# Patient Record
Sex: Female | Born: 1965
Health system: Southern US, Community
[De-identification: ages and names within clinical notes are randomized; demographics above are authoritative.]

## PROBLEM LIST (undated history)

## (undated) DIAGNOSIS — F329 Major depressive disorder, single episode, unspecified: Secondary | ICD-10-CM

## (undated) DIAGNOSIS — G43909 Migraine, unspecified, not intractable, without status migrainosus: Secondary | ICD-10-CM

## (undated) DIAGNOSIS — I493 Ventricular premature depolarization: Secondary | ICD-10-CM

## (undated) DIAGNOSIS — R002 Palpitations: Secondary | ICD-10-CM

## (undated) DIAGNOSIS — F32A Depression, unspecified: Secondary | ICD-10-CM

## (undated) DIAGNOSIS — Z87442 Personal history of urinary calculi: Secondary | ICD-10-CM

## (undated) DIAGNOSIS — E039 Hypothyroidism, unspecified: Secondary | ICD-10-CM

## (undated) DIAGNOSIS — R112 Nausea with vomiting, unspecified: Secondary | ICD-10-CM

## (undated) DIAGNOSIS — T7840XA Allergy, unspecified, initial encounter: Secondary | ICD-10-CM

## (undated) DIAGNOSIS — Z8489 Family history of other specified conditions: Secondary | ICD-10-CM

## (undated) DIAGNOSIS — Z9889 Other specified postprocedural states: Secondary | ICD-10-CM

## (undated) DIAGNOSIS — F419 Anxiety disorder, unspecified: Secondary | ICD-10-CM

## (undated) HISTORY — DX: Migraine, unspecified, not intractable, without status migrainosus: G43.909

## (undated) HISTORY — PX: OTHER SURGICAL HISTORY: SHX169

## (undated) HISTORY — DX: Depression, unspecified: F32.A

## (undated) HISTORY — DX: Allergy, unspecified, initial encounter: T78.40XA

## (undated) HISTORY — DX: Hypothyroidism, unspecified: E03.9

## (undated) HISTORY — DX: Major depressive disorder, single episode, unspecified: F32.9

## (undated) HISTORY — PX: UPPER GI ENDOSCOPY: SHX6162

## (undated) HISTORY — PX: BUNIONECTOMY: SHX129

## (undated) HISTORY — DX: Anxiety disorder, unspecified: F41.9

---

## 1998-05-21 ENCOUNTER — Other Ambulatory Visit: Admission: RE | Admit: 1998-05-21 | Discharge: 1998-05-21 | Payer: Self-pay | Admitting: Obstetrics and Gynecology

## 1999-07-04 ENCOUNTER — Other Ambulatory Visit: Admission: RE | Admit: 1999-07-04 | Discharge: 1999-07-04 | Payer: Self-pay | Admitting: Obstetrics and Gynecology

## 2000-04-11 ENCOUNTER — Inpatient Hospital Stay (HOSPITAL_COMMUNITY): Admission: AD | Admit: 2000-04-11 | Discharge: 2000-04-13 | Payer: Self-pay | Admitting: Obstetrics and Gynecology

## 2000-05-23 ENCOUNTER — Other Ambulatory Visit: Admission: RE | Admit: 2000-05-23 | Discharge: 2000-05-23 | Payer: Self-pay | Admitting: Obstetrics and Gynecology

## 2001-06-10 ENCOUNTER — Other Ambulatory Visit: Admission: RE | Admit: 2001-06-10 | Discharge: 2001-06-10 | Payer: Self-pay | Admitting: Obstetrics and Gynecology

## 2002-07-23 ENCOUNTER — Other Ambulatory Visit: Admission: RE | Admit: 2002-07-23 | Discharge: 2002-07-23 | Payer: Self-pay | Admitting: Obstetrics and Gynecology

## 2003-08-19 ENCOUNTER — Other Ambulatory Visit: Admission: RE | Admit: 2003-08-19 | Discharge: 2003-08-19 | Payer: Self-pay | Admitting: Obstetrics and Gynecology

## 2004-09-30 ENCOUNTER — Other Ambulatory Visit: Admission: RE | Admit: 2004-09-30 | Discharge: 2004-09-30 | Payer: Self-pay | Admitting: Obstetrics and Gynecology

## 2007-01-09 ENCOUNTER — Inpatient Hospital Stay (HOSPITAL_COMMUNITY): Admission: AD | Admit: 2007-01-09 | Discharge: 2007-01-12 | Payer: Self-pay | Admitting: Obstetrics & Gynecology

## 2007-01-10 ENCOUNTER — Encounter (INDEPENDENT_AMBULATORY_CARE_PROVIDER_SITE_OTHER): Payer: Self-pay | Admitting: Obstetrics and Gynecology

## 2007-04-06 DIAGNOSIS — E039 Hypothyroidism, unspecified: Secondary | ICD-10-CM

## 2007-04-06 HISTORY — DX: Hypothyroidism, unspecified: E03.9

## 2007-08-01 ENCOUNTER — Ambulatory Visit (HOSPITAL_COMMUNITY): Admission: RE | Admit: 2007-08-01 | Discharge: 2007-08-01 | Payer: Self-pay | Admitting: Obstetrics and Gynecology

## 2007-10-18 ENCOUNTER — Encounter: Payer: Self-pay | Admitting: Family Medicine

## 2007-11-22 ENCOUNTER — Ambulatory Visit: Payer: Self-pay | Admitting: Family Medicine

## 2007-11-22 DIAGNOSIS — G43009 Migraine without aura, not intractable, without status migrainosus: Secondary | ICD-10-CM | POA: Insufficient documentation

## 2007-11-22 DIAGNOSIS — E039 Hypothyroidism, unspecified: Secondary | ICD-10-CM | POA: Insufficient documentation

## 2007-11-29 ENCOUNTER — Encounter: Payer: Self-pay | Admitting: Family Medicine

## 2007-12-05 ENCOUNTER — Encounter: Payer: Self-pay | Admitting: Family Medicine

## 2007-12-25 ENCOUNTER — Ambulatory Visit: Payer: Self-pay | Admitting: Family Medicine

## 2007-12-25 DIAGNOSIS — R42 Dizziness and giddiness: Secondary | ICD-10-CM | POA: Insufficient documentation

## 2008-02-14 ENCOUNTER — Encounter: Payer: Self-pay | Admitting: Family Medicine

## 2008-02-20 ENCOUNTER — Ambulatory Visit: Payer: Self-pay | Admitting: Family Medicine

## 2008-02-20 DIAGNOSIS — R55 Syncope and collapse: Secondary | ICD-10-CM | POA: Insufficient documentation

## 2008-02-20 LAB — CONVERTED CEMR LAB
Anti Nuclear Antibody(ANA): NEGATIVE
Sed Rate: 7 mm/hr (ref 0–22)
TSH: 0.516 microintl units/mL (ref 0.350–4.50)

## 2008-02-25 ENCOUNTER — Telehealth: Payer: Self-pay | Admitting: Family Medicine

## 2008-03-27 ENCOUNTER — Telehealth: Payer: Self-pay | Admitting: Family Medicine

## 2008-03-31 ENCOUNTER — Ambulatory Visit: Payer: Self-pay | Admitting: Family Medicine

## 2008-04-06 ENCOUNTER — Telehealth: Payer: Self-pay | Admitting: Family Medicine

## 2008-04-07 ENCOUNTER — Telehealth (INDEPENDENT_AMBULATORY_CARE_PROVIDER_SITE_OTHER): Payer: Self-pay | Admitting: *Deleted

## 2008-07-02 ENCOUNTER — Telehealth: Payer: Self-pay | Admitting: Family Medicine

## 2008-07-03 ENCOUNTER — Encounter: Payer: Self-pay | Admitting: Family Medicine

## 2008-07-07 LAB — CONVERTED CEMR LAB: TSH: 0.095 microintl units/mL — ABNORMAL LOW (ref 0.350–4.50)

## 2008-07-27 ENCOUNTER — Ambulatory Visit: Payer: Self-pay | Admitting: Family Medicine

## 2008-07-27 DIAGNOSIS — R5381 Other malaise: Secondary | ICD-10-CM | POA: Insufficient documentation

## 2008-07-27 DIAGNOSIS — N920 Excessive and frequent menstruation with regular cycle: Secondary | ICD-10-CM | POA: Insufficient documentation

## 2008-07-27 DIAGNOSIS — R5383 Other fatigue: Secondary | ICD-10-CM

## 2008-07-28 ENCOUNTER — Encounter: Payer: Self-pay | Admitting: Family Medicine

## 2008-07-29 LAB — CONVERTED CEMR LAB
HCT: 39.9 % (ref 36.0–46.0)
Hemoglobin: 13.1 g/dL (ref 12.0–15.0)
Iron: 124 ug/dL (ref 42–145)
MCHC: 32.8 g/dL (ref 30.0–36.0)
Platelets: 238 10*3/uL (ref 150–400)
RDW: 12.9 % (ref 11.5–15.5)
TIBC: 394 ug/dL (ref 250–470)

## 2008-08-06 ENCOUNTER — Telehealth: Payer: Self-pay | Admitting: Family Medicine

## 2008-08-14 ENCOUNTER — Telehealth: Payer: Self-pay | Admitting: Family Medicine

## 2008-10-02 ENCOUNTER — Ambulatory Visit: Payer: Self-pay | Admitting: Family Medicine

## 2008-10-02 ENCOUNTER — Emergency Department (HOSPITAL_BASED_OUTPATIENT_CLINIC_OR_DEPARTMENT_OTHER): Admission: EM | Admit: 2008-10-02 | Discharge: 2008-10-02 | Payer: Self-pay | Admitting: Emergency Medicine

## 2008-10-02 DIAGNOSIS — F410 Panic disorder [episodic paroxysmal anxiety] without agoraphobia: Secondary | ICD-10-CM | POA: Insufficient documentation

## 2008-10-06 ENCOUNTER — Telehealth: Payer: Self-pay | Admitting: Family Medicine

## 2008-10-07 ENCOUNTER — Telehealth (INDEPENDENT_AMBULATORY_CARE_PROVIDER_SITE_OTHER): Payer: Self-pay | Admitting: *Deleted

## 2008-10-07 ENCOUNTER — Encounter: Payer: Self-pay | Admitting: Family Medicine

## 2008-10-07 LAB — CONVERTED CEMR LAB
Free T4: 0.63 ng/dL — ABNORMAL LOW
T3, Free: 3.2 pg/mL
Thyroglobulin Ab: 356.2 — ABNORMAL HIGH

## 2008-10-13 ENCOUNTER — Encounter (HOSPITAL_COMMUNITY): Admission: RE | Admit: 2008-10-13 | Discharge: 2008-12-02 | Payer: Self-pay | Admitting: Family Medicine

## 2008-10-14 ENCOUNTER — Telehealth: Payer: Self-pay | Admitting: Family Medicine

## 2008-10-15 ENCOUNTER — Telehealth: Payer: Self-pay | Admitting: Family Medicine

## 2008-10-16 ENCOUNTER — Telehealth: Payer: Self-pay | Admitting: Family Medicine

## 2008-10-27 ENCOUNTER — Telehealth: Payer: Self-pay | Admitting: Family Medicine

## 2008-11-11 ENCOUNTER — Encounter: Payer: Self-pay | Admitting: Family Medicine

## 2008-11-11 ENCOUNTER — Telehealth: Payer: Self-pay | Admitting: Family Medicine

## 2008-11-12 LAB — CONVERTED CEMR LAB
Free T4: 0.83 ng/dL (ref 0.80–1.80)
T3, Free: 3.1 pg/mL (ref 2.3–4.2)

## 2008-11-16 ENCOUNTER — Ambulatory Visit: Payer: Self-pay | Admitting: Family Medicine

## 2009-02-17 ENCOUNTER — Encounter: Payer: Self-pay | Admitting: Family Medicine

## 2009-02-18 LAB — CONVERTED CEMR LAB
Free T4: 0.61 ng/dL — ABNORMAL LOW (ref 0.80–1.80)
T3, Free: 3.7 pg/mL (ref 2.3–4.2)
TSH: 3.34 microintl units/mL (ref 0.350–4.500)

## 2009-07-05 ENCOUNTER — Encounter (INDEPENDENT_AMBULATORY_CARE_PROVIDER_SITE_OTHER): Payer: Self-pay | Admitting: *Deleted

## 2009-07-07 ENCOUNTER — Encounter: Payer: Self-pay | Admitting: Family Medicine

## 2009-07-08 LAB — CONVERTED CEMR LAB: TSH: 0.014 microintl units/mL — ABNORMAL LOW (ref 0.350–4.500)

## 2009-09-08 ENCOUNTER — Telehealth: Payer: Self-pay | Admitting: Family Medicine

## 2009-09-16 ENCOUNTER — Encounter: Payer: Self-pay | Admitting: Family Medicine

## 2009-09-22 ENCOUNTER — Encounter: Payer: Self-pay | Admitting: Family Medicine

## 2009-09-22 ENCOUNTER — Telehealth (INDEPENDENT_AMBULATORY_CARE_PROVIDER_SITE_OTHER): Payer: Self-pay | Admitting: *Deleted

## 2009-09-23 ENCOUNTER — Ambulatory Visit: Payer: Self-pay | Admitting: Family Medicine

## 2009-10-05 ENCOUNTER — Telehealth (INDEPENDENT_AMBULATORY_CARE_PROVIDER_SITE_OTHER): Payer: Self-pay | Admitting: *Deleted

## 2009-10-05 ENCOUNTER — Encounter: Payer: Self-pay | Admitting: Family Medicine

## 2010-01-03 ENCOUNTER — Telehealth: Payer: Self-pay | Admitting: Family Medicine

## 2010-01-27 ENCOUNTER — Ambulatory Visit: Payer: Self-pay | Admitting: Family Medicine

## 2010-01-31 ENCOUNTER — Telehealth: Payer: Self-pay | Admitting: Family Medicine

## 2010-02-01 ENCOUNTER — Encounter: Payer: Self-pay | Admitting: Family Medicine

## 2010-02-01 LAB — CONVERTED CEMR LAB
Albumin: 4.2 g/dL (ref 3.5–5.2)
Alkaline Phosphatase: 70 units/L (ref 39–117)
BUN: 7 mg/dL (ref 6–23)
Creatinine, Ser: 0.6 mg/dL (ref 0.40–1.20)
Eosinophils Absolute: 0.1 10*3/uL (ref 0.0–0.7)
Eosinophils Relative: 1 % (ref 0–5)
Glucose, Bld: 82 mg/dL (ref 70–99)
HCT: 41.4 % (ref 36.0–46.0)
HDL: 42 mg/dL (ref >39)
Hemoglobin: 13.2 g/dL (ref 12.0–15.0)
LDL Cholesterol: 103 mg/dL — ABNORMAL HIGH (ref 0–99)
Lymphs Abs: 1.9 10*3/uL (ref 0.7–4.0)
MCV: 99.3 fL (ref 78.0–100.0)
Monocytes Absolute: 0.7 10*3/uL (ref 0.1–1.0)
Monocytes Relative: 7 % (ref 3–12)
Neutrophils Relative %: 74 % (ref 43–77)
Potassium: 4.2 meq/L (ref 3.5–5.3)
RBC: 4.17 M/uL (ref 3.87–5.11)
Triglycerides: 135 mg/dL (ref <150)
WBC: 10.2 10*3/uL (ref 4.0–10.5)

## 2010-02-17 ENCOUNTER — Telehealth: Payer: Self-pay | Admitting: Family Medicine

## 2010-06-27 ENCOUNTER — Encounter: Payer: Self-pay | Admitting: Family Medicine

## 2010-07-01 ENCOUNTER — Telehealth: Payer: Self-pay | Admitting: Family Medicine

## 2010-07-05 NOTE — Miscellaneous (Signed)
Summary: Naratriptan #9 tabs/ month covered.  Clinical Lists Changes  Medications: Rx of NARATRIPTAN HCL 2.5 MG TABS (NARATRIPTAN HCL) 1 tab by mouth x 1; repeat in 4 hrs if needed;  #9 x 2;  Signed;  Entered by: Seymour Bars DO;  Authorized by: Seymour Bars DO;  Method used: [id number] [address], [place], [place], [place]  [address], Ph: [phone] or [phone], Fax: [phone]    Prescriptions: NARATRIPTAN HCL 2.5 MG TABS (NARATRIPTAN HCL) 1 tab by mouth x 1; repeat in 4 hrs if needed  #9 x 2   Entered and Authorized by:   Seymour Bars DO   Signed by:   Seymour Bars DO on 10/05/2009   Method used:   [facility]        [facility]  [address] [phone][facility] (retail[facility]       [address] [place], [place]  [address]       Ph: [phone] or [phone]       Fax: [phone]   RxID:   [id number]

## 2010-07-05 NOTE — Medication Information (Signed)
Summary: Non Porcine Adjustment/[facility]  Non Porcine Adjustment/[facility]   Imported By: Lanelle Bal 02/09/2010 08:54:17  _____________________________________________________________________  External Attachment:    Type:   Image     Comment:   External Document

## 2010-07-05 NOTE — Assessment & Plan Note (Signed)
Summary: f/u thyroid/ mood   Vital Signs:  Patient profile:   45 year old female Height:      66.5 inches Weight:      141 pounds BMI:     22.50 O2 Sat:      96 % on Room air Pulse rate:   72 / minute BP sitting:   112 / 72  (left arm) Cuff size:   regular  Vitals Entered By: Payton Spark CMA (September 23, 2009 8:20 AM)  O2 Flow:  Room air CC: F/U thyroid. Refill xanax.    Primary Care Provider:  Seymour Bars DO  CC:  F/U thyroid. Refill xanax. Marland Kitchen  History of Present Illness: 45 yo WF presents for f/u hypothyroidism.  She has been feeling well on the compounded thyroid.  She just had her labs rechecked.  Her TSH was low but her free T4 was low normal.  She is not having palpitations very often.  She has some anxiety but it is situational.  Her energy level has been good.  Her new compounded thyroid dose was sent to [facility] yesterday.   She continues to have menstrual migraines, using Maxalt.        Current Medications (verified): 1)  Celexa 20 Mg  Tabs (Citalopram Hydrobromide) .... 1.5 Tabs By Mouth Daily 2)  Maxalt-Mlt 5 Mg  Tbdp (Rizatriptan Benzoate) .Marland Kitchen.. 1 Tab By Mouth X1, Repeat in 2 Hrs If Needed 3)  T4 105 Mcg & T3 20 Mcg Sr Capsules .... Take 1 Capsule By Mouth Before Breakfast 4)  Alprazolam 0.5 Mg Tabs (Alprazolam) .... 1/2 To 1 Tab By Mouth Two Times A Day As Needed Anxiety/ Panic  Allergies (verified): 1)  ! Baclofen 2)  ! Flexeril 3)  ! Lodine  Past History:  Past Medical History: hypothyroidism (started post-partum) 11-08 allergies depression/ anxiety migraines Hx of ASCUS with neg high risk HPV  gyn: Dr Earnest Bailey and pap 10-2009  Past Surgical History: Reviewed history from 12/05/2007 and no changes required. bunion surgery  [facility] (Dr Marcelle Overlie)   Social History: Reviewed history from 07/27/2008 and no changes required. 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 Never smoked. Denies  ETOH. Does not exercise. Fair diet  Review of Systems      See HPI  Physical Exam  General:  alert, well-developed, well-nourished, and well-hydrated.   Head:  normocephalic and atraumatic.   Ears:  EACs patent; TMs translucent and gray with good cone of light and bony landmarks.  Nose:  no nasal discharge.   Mouth:  good dentition and pharynx pink and moist.   Neck:  no masses.   Lungs:  Normal respiratory effort, chest expands symmetrically. Lungs are clear to auscultation, no crackles or wheezes. Heart:  Normal rate and regular rhythm. S1 and S2 normal without gallop, murmur, click, rub or other extra sounds. Extremities:  no LE edema Skin:  color normal.   Cervical Nodes:  No lymphadenopathy noted Psych:  good eye contact, not anxious appearing, and not depressed appearing.     Impression & Recommendations:  Problem # 1:  UNSPECIFIED HYPOTHYROIDISM (ICD-244.9) Doing well symptomatically and dose is being adjusted thru Dr Keitha Butte at [facility]. New RX was sent over yesterday.  Thyroid labs are UTD.    Problem # 2:  COMMON MIGRAINE (ICD-346.10) Changed her Maxalt to Naratriptan since it has a longer half life for menstrual migraine. Her updated medication list for this  problem includes:    Naratriptan Hcl 2.5 Mg Tabs (Naratriptan hcl) .Marland Kitchen... 1 tab by mouth x 1; repeat in 4 hrs if needed  Problem # 3:  PANIC ATTACK (ICD-300.01) Improved from last year.  I filled her RX for Alprazolam, using sparingly and will continue current dose of Celexa. Her updated medication list for this problem includes:    Celexa 20 Mg Tabs (Citalopram hydrobromide) .Marland Kitchen... 1.5 tabs by mouth daily    Alprazolam 0.5 Mg Tabs (Alprazolam) .Marland Kitchen... 1/2 to 1 tab by mouth two times a day as needed anxiety/ panic  Complete Medication List: 1)  Celexa 20 Mg Tabs (Citalopram hydrobromide) .... 1.5 tabs by mouth daily 2)  Naratriptan Hcl 2.5 Mg Tabs (Naratriptan hcl) .Marland Kitchen.. 1 tab by mouth x 1; repeat in 4 hrs if  needed 3)  T4 105 Mcg & T3 20 Mcg Sr Capsules  .... Take 1 capsule by mouth before breakfast 4)  Alprazolam 0.5 Mg Tabs (Alprazolam) .... 1/2 to 1 tab by mouth two times a day as needed anxiety/ panic  Patient Instructions: 1)  Return for f/u in 4 months and will update fasting labs including thyroid panel. Prescriptions: ALPRAZOLAM 0.5 MG TABS (ALPRAZOLAM) 1/2 to 1 tab by mouth two times a day as needed anxiety/ panic  #24 x 0   Entered and Authorized by:   Seymour Bars DO   Signed by:   Seymour Bars DO on 09/23/2009   Method used:   Printed then [facility]ed [facility] ...       [phone][facility] ([facility]ail[facility]       [address] [place]  [address]       Ph: [phone] or [phone]       Fax: [phone]   RxID:   [id number] NARATRIPTAN HCL 2.5 MG TABS (NARATRIPTAN HCL) 1 tab by mouth x 1; repeat in 4 hrs if needed  #12 tabs x 2   Entered and Authorized by:   Seymour Bars DO   Signed by:   Seymour Bars DO on 09/23/2009   Method used:   [facility]        [facility]  [address] [phone][facility] ([facility]ail)       [address] [place], [place]  [address]       Ph: [phone] or [phone]       Fax: [phone]   RxID:   [id number]

## 2010-07-05 NOTE — Progress Notes (Signed)
Summary: form for prior auth on Naratriptan faxed to [facility]  Phone Note Outgoing Call   Call placed by: Marj Call placed to: [employer] Summary of Call: form received from [facility] for Naratriptan 2.5 HCL faxed to [facility] Initial call taken by: Roselle Locus,  Oct 05, 2009 2:05 PM  Follow-up for Phone Call        Forms received and placed in Dr. Senaida Lange office Follow-up by: Payton Spark CMA,  Oct 05, 2009 2:23 PM

## 2010-07-05 NOTE — Progress Notes (Signed)
Summary: Not better  Phone Note Call from Patient Call back at [phone]   Caller: Patient Call For: [facility] Summary of Call: Pt see by Dr. Cathey Endow last week and was told to call if not feeling any better. Pt is not better at all states she had a rough weekend with symptoms Initial call taken by: Kathlene November,  January 31, 2010 8:41 AM  Follow-up for Phone Call        Will tx for sinusitis. Follow-up by: Nani Gasser MD,  January 31, 2010 9:29 AM  Additional Follow-up for Phone Call Additional follow up Details #1::        Pt notified med sent to pharmacy Additional Follow-up by: Kathlene November,  January 31, 2010 9:48 AM    New/Updated Medications: AMOXICILLIN 875 MG TABS (AMOXICILLIN) Take 1 tablet by mouth two times a day for 10 days Prescriptions: AMOXICILLIN 875 MG TABS (AMOXICILLIN) Take 1 tablet by mouth two times a day for 10 days  #20 x 0   Entered and Authorized by:   Nani Gasser MD   Signed by:   Nani Gasser MD on 01/31/2010   Method used:   [facility]        [facility]  [address] [phone][facility] ([facility]ail[facility]       [address] [place], [place]  [address]       Ph: [phone] or [phone]       Fax: [phone]   RxID:   [id number]

## 2010-07-05 NOTE — Progress Notes (Signed)
Summary: TSH labs  Phone Note Call from Patient   Caller: Patient Summary of Call: Pt is going for TSH labs this week. Please order and I will fax.  Initial call taken by: Payton Spark CMA,  September 08, 2009 2:13 PM  Follow-up for Phone Call        ordered. Follow-up by: Seymour Bars DO,  September 08, 2009 2:17 PM     Appended Document: TSH labs faxed

## 2010-07-05 NOTE — Assessment & Plan Note (Signed)
Summary: URI/ labs   Vital Signs:  Patient profile:   45 year old female Height:      66.5 inches Weight:      144 pounds BMI:     22.98 O2 Sat:      96 % on Room air Pulse rate:   83 / minute BP sitting:   111 / 67  (left arm) Cuff size:   regular  Vitals Entered By: Payton Spark CMA (January 27, 2010 8:36 AM)  O2 Flow:  Room air CC: F/U. Also c/o congestion, ST and productive cough x 3 days.   Primary Care Provider:  Seymour Bars DO  CC:  F/U. Also c/o congestion and ST and productive cough x 3 days.Marland Kitchen  History of Present Illness: 45 yo WF presents for a cold that began Sunday night.  She woke up with a sore throat.  Started having body aches on Monday.  she has been very tired.  She has head and chest congestion.  She has not taken anything OTC for her symptoms.  Denies sick contacts.  Denies sinus pressure or pain.  Denies sputum production, CP or DOE.  Current Medications (verified): 1)  Celexa 20 Mg  Tabs (Citalopram Hydrobromide) .... 1.5 Tabs By Mouth Daily 2)  Naratriptan Hcl 2.5 Mg Tabs (Naratriptan Hcl) .Marland Kitchen.. 1 Tab By Mouth X 1; Repeat in 4 Hrs If Needed 3)  T4 105 Mcg & T3 20 Mcg Sr Capsules .... Take 1 Capsule By Mouth Before Breakfast 4)  Alprazolam 0.5 Mg Tabs (Alprazolam) .... 1/2 To 1 Tab By Mouth Two Times A Day As Needed Anxiety/ Panic  Allergies (verified): 1)  ! Baclofen 2)  ! Flexeril 3)  ! Lodine  Past History:  Past Medical History: Reviewed history from 09/23/2009 and no changes required. hypothyroidism (started post-partum) 11-08 allergies depression/ anxiety migraines Hx of ASCUS with neg high risk HPV  gyn: Dr Earnest Bailey and pap 10-2009  Social History: Reviewed history from 07/27/2008 and no changes required. 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 Never smoked. Denies ETOH. Does not exercise. Fair diet  Review of Systems      See HPI  Physical Exam  General:  alert, well-developed,  well-nourished, and well-hydrated.   Head:  normocephalic and atraumatic.  sinuses NTTP Eyes:  conjunctiva minimally injected with watering bilat, no discharge or lid edema Ears:  EACs patent; TMs translucent and gray with good cone of light and bony landmarks.  Nose:  nasal congestion with clear rhinorrhea present Mouth:  o/p mildly injected.  no exudates or vesicles Neck:  shotty, tender submandibular LNs Lungs:  Normal respiratory effort, chest expands symmetrically. Lungs are clear to auscultation, no crackles or wheezes. Heart:  Normal rate and regular rhythm. S1 and S2 normal without gallop, murmur, click, rub or other extra sounds. Skin:  color normal.     Impression & Recommendations:  Problem # 1:  VIRAL URI (ICD-465.9) Day 5 of viral URI w/o any sign of bacterial infection. Work on supportive care measures and call if not improved in 7 days.  Problem # 2:  UNSPECIFIED HYPOTHYROIDISM (ICD-244.9) Repeat labs today with fasting labs.  Adjust as indicated. Orders: T-TSH [phone]) T-T3, Free [phone]) T-T4, Free [phone])  Labs Reviewed: TSH: 0.120 (09/16/2009)     Complete Medication List: 1)  Celexa 20 Mg Tabs (Citalopram hydrobromide) .... 1.5 tabs by mouth daily 2)  Naratriptan Hcl 2.5 Mg Tabs (Naratriptan hcl) .Marland KitchenMarland KitchenMarland Kitchen  1 tab by mouth x 1; repeat in 4 hrs if needed 3)  T4 105 Mcg & T3 20 Mcg Sr Capsules  .... Take 1 capsule by mouth before breakfast 4)  Alprazolam 0.5 Mg Tabs (Alprazolam) .... 1/2 to 1 tab by mouth two times a day as needed anxiety/ panic  Other Orders: T-CBC w/Diff ([id number]) T-Comprehensive Metabolic Panel [phone]) T-Lipid Profile ([id number])  Patient Instructions: 1)  Supportive care for viral upper respiratory tract infection. 2)  REst, clear fluids, nasal saline, Advil Cold and Sinus. 3)  Call if not improved in 7 days. 4)  Labs today. 5)  Will call you w/ results tomorrow.

## 2010-07-05 NOTE — Progress Notes (Signed)
Summary: Thyroid Rx update        New/Updated Medications: * T4 105 MCG & T3 20 MCG SR CAPSULES Take 1 capsule by mouth before breakfast

## 2010-07-05 NOTE — Progress Notes (Signed)
Summary: Thyroid RF  Phone Note Refill Request   Refills Requested: Medication #1:  T4 105 MCG & T3 20 MCG SR CAPSULES Take 1 capsule by mouth before breakfast Pt requests 90 day supply.  Initial call taken by: Payton Spark CMA,  January 03, 2010 12:08 PM    Prescriptions: T4 105 MCG & T3 20 MCG SR CAPSULES Take 1 capsule by mouth before breakfast  #90 x 1   Entered and Authorized by:   Seymour Bars DO   Signed by:   Seymour Bars DO on 01/03/2010   Method used:   Printed then [facility]ed [facility] ...       [facility]  [address] [phone][facility] ([facility]ail)       [address] [place]  [address]       Ph: [phone] or [phone]       Fax: [phone]   RxID:   [id number]  th  Appended Document: Thyroid RF Pt aware of the above

## 2010-07-05 NOTE — Miscellaneous (Signed)
Summary: Orders Update  Clinical Lists Changes  Orders: Added new Test order of T-T3, Free [phone]) - Signed Added new Test order of T-T4, Free [phone]) - Signed Added new Test order of T-TSH [phone]) - Signed

## 2010-07-05 NOTE — Medication Information (Signed)
Summary: T4 & T3/[facility]  T4 & T3/[facility]   Imported By: Lanelle Bal 09/28/2009 14:27:08  _____________________________________________________________________  External Attachment:    Type:   Image     Comment:   External Document

## 2010-07-05 NOTE — Progress Notes (Signed)
Summary: URI Sxs have returned   Phone Note Call from Patient   Caller: Patient Summary of Call: Pt states she recently completed Amox given to her over the phone by Dr. Linford Arnold. Pt felt better for a few days but now all prior Sxs have returned and she is feeling bad again. Pt leaves for [place] in 2 weeks and wants to be sure she is well before leaving.  Initial call taken by: Payton Spark CMA,  February 17, 2010 2:20 PM  Follow-up for Phone Call        I reviewed her labs etc and she was treated with a good round of antibiotics.  I do not think her symptoms are of a recurrent bacterial infection since she just came off antibitotics.  She may be having more allergies.   If she is having sinus pain/ pressure/ fevers, set up OV.   Follow-up by: Seymour Bars DO,  February 18, 2010 8:14 AM     Appended Document: URI Sxs have returned  [facility] informing Pt of the above

## 2010-07-07 NOTE — Progress Notes (Signed)
Summary: thyroid labs  Phone Note Call from Patient Call back at [phone]   Caller: Patient Call For: Seymour Bars DO Summary of Call: pt would llike to know if it is time for repeat thyroid labs.  She is about due for refill of meds. Initial call taken by: Francee Piccolo CMA Duncan Dull),  July 01, 2010 10:37 AM  Follow-up for Phone Call        lab order printed.  not due till mid FEB. Follow-up by: Seymour Bars DO,  July 01, 2010 10:47 AM     Appended Document: thyroid labs Pt aware

## 2010-07-16 ENCOUNTER — Encounter: Payer: Self-pay | Admitting: Family Medicine

## 2010-07-18 LAB — CONVERTED CEMR LAB
Free T4: 0.98 ng/dL (ref 0.80–1.80)
TSH: 0.798 microintl units/mL (ref 0.350–4.500)

## 2010-08-03 ENCOUNTER — Encounter: Payer: Self-pay | Admitting: Emergency Medicine

## 2010-08-11 NOTE — Letter (Signed)
Summary: External Correspondence  External Correspondence   Imported By: Dannette Barbara 08/03/2010 14:35:47  _____________________________________________________________________  External Attachment:    Type:   Image     Comment:   External Document

## 2010-08-23 ENCOUNTER — Encounter: Payer: Self-pay | Admitting: Family Medicine

## 2010-08-30 ENCOUNTER — Ambulatory Visit: Payer: Self-pay | Admitting: Family Medicine

## 2010-09-05 ENCOUNTER — Encounter: Payer: Self-pay | Admitting: Family Medicine

## 2010-09-08 ENCOUNTER — Encounter: Payer: Self-pay | Admitting: Family Medicine

## 2010-09-08 ENCOUNTER — Ambulatory Visit (INDEPENDENT_AMBULATORY_CARE_PROVIDER_SITE_OTHER): Payer: BC Managed Care – PPO | Admitting: Family Medicine

## 2010-09-08 DIAGNOSIS — E039 Hypothyroidism, unspecified: Secondary | ICD-10-CM

## 2010-09-08 DIAGNOSIS — F3289 Other specified depressive episodes: Secondary | ICD-10-CM

## 2010-09-08 DIAGNOSIS — G43009 Migraine without aura, not intractable, without status migrainosus: Secondary | ICD-10-CM

## 2010-09-08 DIAGNOSIS — F329 Major depressive disorder, single episode, unspecified: Secondary | ICD-10-CM

## 2010-09-08 MED ORDER — ALPRAZOLAM 0.5 MG PO TABS: 0.5000 mg | ORAL_TABLET | Freq: Two times a day (BID) | ORAL | Status: DC | PRN

## 2010-09-08 MED ORDER — RIZATRIPTAN BENZOATE 10 MG PO TABS: 10.0000 mg | ORAL_TABLET | Freq: Once | ORAL | Status: DC | PRN

## 2010-09-08 MED ORDER — TOPIRAMATE 25 MG PO TABS: ORAL_TABLET | ORAL | Status: DC

## 2010-09-08 MED ORDER — CITALOPRAM HYDROBROMIDE 40 MG PO TABS: 40.0000 mg | ORAL_TABLET | Freq: Every day | ORAL | Status: DC

## 2010-09-08 NOTE — Progress Notes (Signed)
  Subjective:    Patient ID: Sydney Bailey, female    DOB: [birth year], 45 y.o.   MRN: [id number]  HPI  45 yo WF presents for problems with fatigue and stress.  She has been stressed out since Christmas.  She is having to work more since her co-worker has been sick.  This is starting to get better.  She takes Celexa 20 mg / day which helps but she is interested in upping her dose.  She is having 3-4 HAs/ wk and the naratriptan is not helping as much.  She is seeing a chiropractor which has helped her back and neck pain.  Her TSH, free T3 and T4 were perfect in Feb, on compounded thyroid.  Denies suicidal thoughts but is both anxious and tired all the time.  She is not sleeping as well.    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    BP 110/72  Pulse 62  Ht 5' 6.5" (1.689 m)  Wt 147 lb (66.679 kg)  BMI 23.37 kg/m2  SpO2 99%  LMP 09/01/2010    Review of Systems  Constitutional: Positive for fatigue. Negative for fever and unexpected weight change.  Respiratory: Negative for shortness of breath.   Cardiovascular: Negative for palpitations.  Gastrointestinal: Negative for nausea, diarrhea and constipation.  Skin: Negative for pallor.  Neurological: Positive for headaches. Negative for weakness.  Hematological: Does not bruise/bleed easily.  Psychiatric/Behavioral: Positive for sleep disturbance, dysphoric mood and decreased concentration. Negative for suicidal ideas and self-injury. The patient is nervous/anxious. The patient is not hyperactive.        Objective:   Physical Exam  Constitutional: She appears well-developed and well-nourished. No distress.  HENT:  Head: Normocephalic and atraumatic.  Eyes: Conjunctivae are normal. No scleral icterus.  Neck: Neck supple. No thyromegaly present.  Cardiovascular: Normal rate, regular rhythm and normal heart sounds.   Pulmonary/Chest: Effort normal and breath sounds normal.    Musculoskeletal: She exhibits no edema.  Skin: Skin is warm and dry. No pallor.  Psychiatric:       Flat affect          Assessment & Plan:

## 2010-09-08 NOTE — Patient Instructions (Addendum)
Increase Citalopram to 40 mg once daily.  Add Topamax at bedtime for migraine prevention -- start with 25 mg at night for 1 wk , then go up to 50 mg at night until your follow up with me.  Work on improving sleep, exercise, stress reduction.  Use Maxalt (instead of naratriptan) for migraine rescue up to 2 x a wk and use Aleve for less severe headaches up to 3 x a wk.  Return in 1 month for f/u/ [facility] #2

## 2010-09-09 NOTE — Assessment & Plan Note (Signed)
More frequent migraines linked to added stressors, poor sleep, lack of exercise.  Naratriptan not working as well for rescue.  Will go ahead and start her on prophylactic treatment with Topamax at bedtime, 25 mg for the first week, then up to 50 mg until f/u visit.  May need to titrate up to 100 mg bid.  We discussed stress reduction, sleep, diet and exercise as part of the treament.

## 2010-09-09 NOTE — Assessment & Plan Note (Signed)
Reviewed her TSH, Free t3 and free t4 levels from [place] and they were perfect!  She is to stay on the current dose of thyroid medication.  WE discussed that her fatigue was NOT due to her thyroid.

## 2010-09-09 NOTE — Assessment & Plan Note (Signed)
Worsened by stressors at home and work over the past 3 mos.  She agrees to go up on her dose of Celexa from 20 to 40 mg/ day.  Will call if any problems and will work on stress reduction.  F/u in 6 wks.  She anticipates her work schedule to get better.

## 2010-09-14 LAB — DIFFERENTIAL
Lymphocytes Relative: 33 % (ref 12–46)
Lymphs Abs: 2.2 10*3/uL (ref 0.7–4.0)
Monocytes Absolute: 0.6 10*3/uL (ref 0.1–1.0)
Monocytes Relative: 9 % (ref 3–12)
Neutro Abs: 3.7 10*3/uL (ref 1.7–7.7)

## 2010-09-14 LAB — CBC
Hemoglobin: 13.5 g/dL (ref 12.0–15.0)
RBC: 4.26 MIL/uL (ref 3.87–5.11)

## 2010-09-14 LAB — BASIC METABOLIC PANEL
CO2: 21 mEq/L (ref 19–32)
Calcium: 9.2 mg/dL (ref 8.4–10.5)
GFR calc Af Amer: >60 mL/min (ref >60)
GFR calc non Af Amer: >60 mL/min (ref >60)
Sodium: 137 mEq/L (ref 135–145)

## 2010-09-25 ENCOUNTER — Other Ambulatory Visit: Payer: Self-pay | Admitting: Family Medicine

## 2010-10-04 ENCOUNTER — Other Ambulatory Visit: Payer: Self-pay | Admitting: *Deleted

## 2010-10-04 MED ORDER — CITALOPRAM HYDROBROMIDE 40 MG PO TABS: 40.0000 mg | ORAL_TABLET | Freq: Every day | ORAL | Status: DC

## 2010-10-11 ENCOUNTER — Encounter: Payer: Self-pay | Admitting: Family Medicine

## 2010-10-11 ENCOUNTER — Other Ambulatory Visit: Payer: Self-pay | Admitting: Family Medicine

## 2010-10-11 ENCOUNTER — Emergency Department (HOSPITAL_BASED_OUTPATIENT_CLINIC_OR_DEPARTMENT_OTHER)
Admission: EM | Admit: 2010-10-11 | Discharge: 2010-10-11 | Disposition: A | Discharge status: Home / Self Care | Payer: BC Managed Care – PPO | Attending: Emergency Medicine | Admitting: Emergency Medicine

## 2010-10-11 ENCOUNTER — Ambulatory Visit (INDEPENDENT_AMBULATORY_CARE_PROVIDER_SITE_OTHER): Payer: BC Managed Care – PPO | Admitting: Family Medicine

## 2010-10-11 VITALS — BP 114/70 | HR 75 | Ht 66.5 in | Wt 144.0 lb

## 2010-10-11 DIAGNOSIS — F329 Major depressive disorder, single episode, unspecified: Secondary | ICD-10-CM

## 2010-10-11 DIAGNOSIS — F410 Panic disorder [episodic paroxysmal anxiety] without agoraphobia: Secondary | ICD-10-CM

## 2010-10-11 DIAGNOSIS — F3289 Other specified depressive episodes: Secondary | ICD-10-CM

## 2010-10-11 DIAGNOSIS — F411 Generalized anxiety disorder: Secondary | ICD-10-CM | POA: Insufficient documentation

## 2010-10-11 DIAGNOSIS — G43009 Migraine without aura, not intractable, without status migrainosus: Secondary | ICD-10-CM

## 2010-10-11 DIAGNOSIS — Z23 Encounter for immunization: Secondary | ICD-10-CM

## 2010-10-11 DIAGNOSIS — E039 Hypothyroidism, unspecified: Secondary | ICD-10-CM | POA: Insufficient documentation

## 2010-10-11 DIAGNOSIS — G43909 Migraine, unspecified, not intractable, without status migrainosus: Secondary | ICD-10-CM

## 2010-10-11 DIAGNOSIS — Z79899 Other long term (current) drug therapy: Secondary | ICD-10-CM | POA: Insufficient documentation

## 2010-10-11 MED ORDER — ZOLPIDEM TARTRATE ER 6.25 MG PO TBCR: 6.2500 mg | EXTENDED_RELEASE_TABLET | Freq: Every evening | ORAL | Status: AC | PRN

## 2010-10-11 MED ORDER — PROMETHAZINE HCL 25 MG RE SUPP: 25.0000 mg | Freq: Four times a day (QID) | RECTAL | Status: AC | PRN

## 2010-10-11 MED ORDER — ALPRAZOLAM 0.5 MG PO TABS: ORAL_TABLET | ORAL | Status: DC

## 2010-10-11 NOTE — Assessment & Plan Note (Signed)
tsh perfect in Feb.  Will recheck her in 2 mos.

## 2010-10-11 NOTE — Assessment & Plan Note (Signed)
Continue Celexa 40 mg/ day.  Hopefully improving her physical feelings off topamax helps her mood.

## 2010-10-11 NOTE — Patient Instructions (Addendum)
Wean off Topamax.  Take 1 tab at bedtime for 3 days then stop.  [facility]erral [facility]. OK to use Maxalt for rescue.  Xanax RX changed, RFd. Stay on Celexa 40 mg/ day. Call if you want to add conseling.  RX for Phenergan suppositories given. RX for Ambien CR (low dose) to take at bedtime given.  Return for f/u sleep/ anxiety/ HAs/ thyroid in 6 wks.

## 2010-10-11 NOTE — Progress Notes (Signed)
Subjective:    Patient ID: Sydney Bailey, female    DOB: Jun 29, 1965, 45 y.o.   MRN: [id number]  HPI  45 yo WF presents for a panic attack that woke her up from sleep at 1:30 In the AM last night.  She took 1/2 of a Xanax.  She had racing head, shortness of breath.  She was trying to relax but she was not feeling any better.  She heard her daughter cry and it set off her anxiety even more.  She felt a little better after taking the other 1/2 of her xanax.  She felt like her body was 'flushed'.  She was given Ativan 2 x  To relax her.  She had not had a bad one in a year.  She is having less HAs on Topamax but is having trouble with paresthesias. And word finding problems.  It is making her less hungry.still taking maxalt for migraine rescue.  Her thyroid was at goal on current meds in Feb.  We increased her Celexa in in April..    BP 114/70  Pulse 75  Ht 5' 6.5" (1.689 m)  Wt 144 lb (65.318 kg)  BMI 22.89 kg/m2  SpO2 97%  Past Medical History  Diagnosis Date  . Hypothyroidism 11-08    started post- partum  . Allergy   . Anxiety   . Depression   . Migraines     Past Surgical History  Procedure Date  . Bunionectomy   . Essure btl     No family history on file.  History   Social History  . Marital Status: Married    Spouse Name: N/A    Number of Children: N/A  . Years of Education: N/A   Occupational History  . Not on file.   Social History Main Topics  . Smoking status: Never Smoker   . Smokeless tobacco: Not on file  . Alcohol Use: No  . Drug Use:   . Sexually Active:    Other Topics Concern  . Not on file   Social History Narrative  . No narrative on file    Allergies  Allergen Reactions  . Baclofen     REACTION: hives  . Cyclobenzaprine Hcl     REACTION: hives  . Etodolac     Current outpatient prescriptions:ALPRAZolam (XANAX) 0.5 MG tablet, 1/2 to 2 tabs po q 4-6 hrs prn panic attacks, Disp: 24 tablet, Rfl: 0;  citalopram (CELEXA) 40 MG tablet,  Take 1 tablet (40 mg total) by mouth daily., Disp: 90 tablet, Rfl: 1;  NON FORMULARY, T4 105 mcg & T3 20 mcg SR Capsules- Take 1 capsule po before breakfast , Disp: , Rfl:  promethazine (PHENERGAN) 25 MG suppository, Place 1 suppository (25 mg total) rectally every 6 (six) hours as needed for nausea., Disp: 12 each, Rfl: 2;  rizatriptan (MAXALT) 10 MG tablet, Take 1 tablet (10 mg total) by mouth once as needed for migraine. May repeat in 2 hours if needed, Disp: 12 tablet, Rfl: 3;  topiramate (TOPAMAX) 25 MG tablet, 1 tab po qhs x 1 wk then increase to 2 tabs po qhs, Disp: 60 tablet, Rfl: 1 zolpidem (AMBIEN CR) 6.25 MG CR tablet, Take 1 tablet (6.25 mg total) by mouth at bedtime as needed for sleep., Disp: 30 tablet, Rfl: 0;  DISCONTD: ALPRAZolam (XANAX) 0.5 MG tablet, Take 1 tablet (0.5 mg total) by mouth 2 (two) times daily as needed. 1/2 to 1 tab po bid prn for anxiety/ panic, Disp:  30 tablet, Rfl: 0;  DISCONTD: naratriptan (AMERGE) 2.5 MG tablet, , Disp: , Rfl:    Review of Systems  Constitutional: Positive for appetite change (decrease) and fatigue. Negative for fever and unexpected weight change.  Eyes: Negative for visual disturbance.  Respiratory: Negative for shortness of breath.   Cardiovascular: Positive for palpitations. Negative for chest pain and leg swelling.  Gastrointestinal: Positive for nausea (only w/ dizziness and HA). Negative for vomiting and abdominal pain.  Neurological: Positive for dizziness, light-headedness and numbness. Negative for tremors and weakness.  Psychiatric/Behavioral: Positive for sleep disturbance, dysphoric mood and decreased concentration. Negative for suicidal ideas and self-injury. The patient is nervous/anxious.        Objective:   Physical Exam  Constitutional: She is oriented to person, place, and time. She appears well-developed and well-nourished.       Appears tired  Eyes: Conjunctivae are normal. No scleral icterus.  Neck: Neck supple. No  thyromegaly present.  Cardiovascular: Normal rate, regular rhythm and normal heart sounds.   No murmur heard. Pulmonary/Chest: Effort normal and breath sounds normal. No respiratory distress. She has no wheezes.  Musculoskeletal: She exhibits no edema.  Lymphadenopathy:    She has no cervical adenopathy.  Neurological: She is alert and oriented to person, place, and time. Gait normal.       No tremor  Skin: Skin is warm and dry.  Psychiatric: Her speech is normal. Judgment and thought content normal. She is withdrawn. Cognition and memory are normal. She exhibits a depressed mood.          Assessment & Plan:

## 2010-10-11 NOTE — Assessment & Plan Note (Signed)
Obvious adverse SEs from topamax.  Will wean off and stop.  Since she has had so many problems with medications, will refer [facility] to [facility] for eval and mgmt.

## 2010-10-11 NOTE — Telephone Encounter (Signed)
Pt didnt get Rx at pharm for the ambien 6.25mg  po QHS /PRN.  They did get the phenergan.  Please advise.  [facility] do [facility] the [place] [facility], but they just have not filled yet.  Pharmacist will call the pt and inform them. Plan:  Routed to Dr. Arlice Colt, LPN [facility]

## 2010-10-11 NOTE — Assessment & Plan Note (Signed)
Severe panic attack last night without known stressors.  Went to the ED and had 2 doses of Ativan so is a bit slowed today.  Has not been having them more than 1 x a yr on Celexa, so will continue.  I wrote out on her alprazolam which she rarely uses that she can take 0.25 up to 1 mg every 4-6 hrs prn.  Consider adding counseling but she declined today.

## 2010-10-15 ENCOUNTER — Emergency Department (HOSPITAL_BASED_OUTPATIENT_CLINIC_OR_DEPARTMENT_OTHER)
Admission: EM | Admit: 2010-10-15 | Discharge: 2010-10-15 | Disposition: A | Discharge status: Home / Self Care | Payer: BC Managed Care – PPO | Attending: Emergency Medicine | Admitting: Emergency Medicine

## 2010-10-15 DIAGNOSIS — F411 Generalized anxiety disorder: Secondary | ICD-10-CM | POA: Insufficient documentation

## 2010-10-15 DIAGNOSIS — Z79899 Other long term (current) drug therapy: Secondary | ICD-10-CM | POA: Insufficient documentation

## 2010-10-15 DIAGNOSIS — E039 Hypothyroidism, unspecified: Secondary | ICD-10-CM | POA: Insufficient documentation

## 2010-10-17 ENCOUNTER — Telehealth: Payer: Self-pay | Admitting: *Deleted

## 2010-10-17 NOTE — Telephone Encounter (Signed)
Pt called requesting referral to Dr. Anastasio Auerbach at [facility] for anxiety and panic attacks. I faxed over notes to his office. Office will contact Pt for apt. Pt is aware.

## 2010-10-18 NOTE — H&P (Signed)
NAMETODD, ARGABRIGHT             ACCOUNT NO.:  [id number]   MEDICAL RECORD NO.:  [id number]          PATIENT TYPE:  INP   LOCATION:                                FACILITY:  [facility]   PHYSICIAN:  Duke Salvia. Marcelle Overlie, M.D.DATE OF BIRTH:  03-16-1966   DATE OF ADMISSION:  DATE OF DISCHARGE:                              HISTORY & PHYSICAL   CHIEF COMPLAINT:  Preeclampsia at term.   HISTORY OF PRESENT ILLNESS:  A 45 year old G63, P2 with EDD of February 04, 2007; EGA is 10 and three-sevenths.  Presents to the office today for  a routine visit complaining of increased lower extremity edema, was  noted to have 3+ proteinuria, a BP 140/90.  Presents now for two-stage  labor induction for mild preeclampsia at term, labs pending.   Her 3-hour GTT was normal after a slightly elevated 1-hour GTT.   She had previously declined amniocentesis with a first trimester screen  showing a 1:119 trisomy risk.  Blood type is O positive.  In the office  today, NST was reactive with some irregular contractions.   PAST MEDICAL HISTORY:  Allergies:  1. LODINE.  2. FLEXERIL.  3. BACLOFEN.   CURRENT MEDICATIONS:  Prenatal vitamins.   OBSTETRICAL HISTORY:  A vaginal delivery in 1997 with mild PIH.  A  vaginal delivery in 2001, a 7-pound 6-ounce female.   For family history and social history, please see the [facility] form for  details.   PHYSICAL EXAMINATION:  Temperature 98.2, blood pressure 140/90.  HEENT:  Unremarkable.  NECK:  Supple without masses.  LUNGS:  Clear.  CARDIOVASCULAR:  Regular rate and rhythm without murmurs, rubs or  gallops noted.  BREASTS:  Not examined.  Fundal height 37 cm.  Fetal heart rate 140.  Cervix was closed, soft, vertex presenting.  EXTREMITIES:  Revealed reflexes 1-2+, 1-2+ edema, no clonus.   IMPRESSION:  1. Thirty-six and three-sevenths week intrauterine pregnancy.  2. Mild pregnancy-induced hypertension with significant proteinuria.   PLAN:  Will admit for  two-stage labor induction, labs pending.      Richard M. Marcelle Overlie, M.D.  Electronically Signed     [facility]MED[facility]  D:  01/09/2007  T:  01/09/2007  Job:  [id number]

## 2010-11-22 ENCOUNTER — Ambulatory Visit: Payer: BC Managed Care – PPO | Admitting: Family Medicine

## 2010-12-13 ENCOUNTER — Telehealth: Payer: Self-pay | Admitting: Family Medicine

## 2010-12-13 DIAGNOSIS — R232 Flushing: Secondary | ICD-10-CM

## 2010-12-13 DIAGNOSIS — E039 Hypothyroidism, unspecified: Secondary | ICD-10-CM

## 2010-12-13 NOTE — Telephone Encounter (Signed)
Pt is on thyroid and progesterone medications and she is stating it is time she get levels done.  Pt needs an order for her thyroid and progesterone levels.  Please send order down to [facility] and call the pt to let her know it has been done at [phone]. Plan:  Routed to Dr. Arlice Colt, LPN [facility]

## 2010-12-13 NOTE — Telephone Encounter (Signed)
Pt informed that lab orders printed and sent to [facility], and she can have them drawn, Jarvis Newcomer, LPN [facility] Dimes

## 2010-12-13 NOTE — Telephone Encounter (Signed)
Lab order printed.  Can be done at the lab downstairs.

## 2010-12-16 ENCOUNTER — Telehealth: Payer: Self-pay | Admitting: Family Medicine

## 2010-12-16 LAB — T3, FREE: T3, Free: 2.7 pg/mL (ref 2.3–4.2)

## 2010-12-16 LAB — T4, FREE: Free T4: 0.98 ng/dL (ref 0.80–1.80)

## 2010-12-16 NOTE — Telephone Encounter (Signed)
Pt aware of the above  

## 2010-12-16 NOTE — Telephone Encounter (Signed)
Pls let pt know that her progesterone level and thyroid came back normal.  Continue current doses of medication.

## 2011-02-27 ENCOUNTER — Other Ambulatory Visit: Payer: Self-pay | Admitting: Family Medicine

## 2011-02-27 MED ORDER — ALPRAZOLAM 0.5 MG PO TABS: ORAL_TABLET | ORAL | Status: DC

## 2011-02-27 NOTE — Telephone Encounter (Signed)
Request for refill of Alprazolam 0.5 mg.  Plan: Refilled # 24/0 refills.  Appt UTD.  Jarvis Newcomer, LPN [facility] Dimes

## 2011-03-20 LAB — CBC
Hemoglobin: 7.2 — CL
MCHC: 33.6
MCHC: 34.1
MCHC: 34.3
MCV: 87.2
MCV: 88.5
Platelets: 193
RBC: 2.41 — ABNORMAL LOW
RDW: 15.3 — ABNORMAL HIGH
RDW: 15.5 — ABNORMAL HIGH
WBC: 9.6

## 2011-03-20 LAB — COMPREHENSIVE METABOLIC PANEL
ALT: 7
AST: 23
Calcium: 8.1 — ABNORMAL LOW
Creatinine, Ser: 0.6
GFR calc Af Amer: >60
Sodium: 134 — ABNORMAL LOW
Total Protein: 5 — ABNORMAL LOW

## 2011-03-20 LAB — LACTATE DEHYDROGENASE: LDH: 142

## 2011-03-20 LAB — RAPID HIV SCREEN (WH-MAU): Rapid HIV Screen: NONREACTIVE

## 2011-04-04 ENCOUNTER — Telehealth: Payer: Self-pay | Admitting: Family Medicine

## 2011-04-04 ENCOUNTER — Other Ambulatory Visit: Payer: Self-pay | Admitting: Family Medicine

## 2011-04-04 MED ORDER — AMBULATORY NON FORMULARY MEDICATION: Status: DC

## 2011-04-04 NOTE — Telephone Encounter (Signed)
Pt called and said she was a previous pt of Dr. Cathey Endow.  York Spaniel it is time for a lab draw for her TSH. Plan:  Reviewed the pt chart and all pt thyroid levels were normal last draw in July 2012.  Told pt she will not be due for labs until next January 2013.  Pt says she needs refills of all her progesterone meds and tsh med. Plan:  Will refill meds forTSH med for 90 day supply.  Pt does not need new prescription at this time for the progesterone medications, but pharm notified to get refills ready for these and [facility] will send over order for sig on the TSH med.  Jarvis Newcomer, LPN [facility] Dimes

## 2011-04-20 ENCOUNTER — Other Ambulatory Visit: Payer: Self-pay | Admitting: Family Medicine

## 2011-06-29 ENCOUNTER — Telehealth: Payer: Self-pay | Admitting: *Deleted

## 2011-06-29 DIAGNOSIS — E039 Hypothyroidism, unspecified: Secondary | ICD-10-CM

## 2011-06-29 DIAGNOSIS — R232 Flushing: Secondary | ICD-10-CM

## 2011-06-30 LAB — PROGESTERONE: Progesterone: 2.6 ng/mL

## 2011-07-03 ENCOUNTER — Encounter: Payer: Self-pay | Admitting: Family Medicine

## 2011-07-03 ENCOUNTER — Ambulatory Visit (INDEPENDENT_AMBULATORY_CARE_PROVIDER_SITE_OTHER): Payer: BC Managed Care – PPO | Admitting: Family Medicine

## 2011-07-03 VITALS — BP 119/72 | HR 81 | Wt 147.0 lb

## 2011-07-03 DIAGNOSIS — E039 Hypothyroidism, unspecified: Secondary | ICD-10-CM

## 2011-07-03 DIAGNOSIS — F419 Anxiety disorder, unspecified: Secondary | ICD-10-CM

## 2011-07-03 DIAGNOSIS — R6884 Jaw pain: Secondary | ICD-10-CM

## 2011-07-03 DIAGNOSIS — F411 Generalized anxiety disorder: Secondary | ICD-10-CM

## 2011-07-03 DIAGNOSIS — H9209 Otalgia, unspecified ear: Secondary | ICD-10-CM

## 2011-07-03 DIAGNOSIS — N920 Excessive and frequent menstruation with regular cycle: Secondary | ICD-10-CM

## 2011-07-03 MED ORDER — AMBULATORY NON FORMULARY MEDICATION: Status: DC

## 2011-07-03 MED ORDER — CITALOPRAM HYDROBROMIDE 40 MG PO TABS: 40.0000 mg | ORAL_TABLET | Freq: Every day | ORAL | Status: DC

## 2011-07-03 MED ORDER — ALPRAZOLAM 0.5 MG PO TABS: ORAL_TABLET | ORAL | Status: DC

## 2011-07-03 NOTE — Patient Instructions (Addendum)
Encourage regular exercise. This will help your headaches. Temporomandibular Joint Pain Your exam shows that you have a problem with your temporomandibular joint (TMJ), the joint that moves when you open your mouth or chew food. TMJ problems can result from direct injuries, bite abnormalities, or tension states which cause you to grind or clench your teeth. Typical symptoms include pain around the joint, clicking, restricted movement, and headaches. The TMJ is like any other joint in the body; when it is strained, it needs rest to repair itself. To keep the joint at rest it is important that you do not open your mouth wider than the width of your index finger. If you must yawn, be sure to support your chin with your hand so your mouth does not open wide. Eat a soft diet (nothing firmer than ground beef, no raw vegetables), do not chew gum and do not talk if it causes you pain. Apply topical heat by using a warm, moist cloth placed in front of the ear for 15 to 20 minutes several times daily. Alternating heat and ice may give even more relief. Anti-inflammatory pain medicine and muscle relaxants can also be helpful. A dental orthotic or splint may be used for temporary relief. Long-term problems may require treatment for stress as well as braces or surgery. Please check with your doctor or dentist if your symptoms do not improve within one week. Document Released: 06/29/2004 Document Revised: 02/01/2011 Document Reviewed: 05/22/2005 [facility] Patient Information 2012 [place], [place].

## 2011-07-03 NOTE — Progress Notes (Signed)
  Subjective:    Patient ID: Sydney Bailey, female    DOB: December 26, 1965, 46 y.o.   MRN: [id number]  HPI Hypothyroid - Says has been happy with her dose but still having some fatigue. Still not happy iwht her weight but not working out either. Still having a lot o fheadaches.  She feels much better than a year ago. No recent skin or hair changes. Taking med daily.   Periods are very irregular. Can be as close as 2 weeks.  Or sometimes will go 3 weeks. Has a gyn. Has already had an ablation. Wonders if her progesterone could be affecting her periods.    Anxiety- Doing well overall. Has had 2 panic attacks in the last year. Would like refill on her benzo.    Left ear and jaw pain for several weeks. Saw her dentist and told exam was normal.  They had recommended a mouth guard OTC to see if helps with her symptoms. Bought one from [employer] but was too uncomfortable to wear.  No worsening or alleviating sxs. No draingage from the ear or hearing loss.    Review of Systems     Objective:   Physical Exam  Constitutional: She is oriented to person, place, and time. She appears well-developed and well-nourished.  HENT:  Head: Normocephalic and atraumatic.  Right Ear: External ear normal.  Left Ear: External ear normal.  Nose: Nose normal.  Mouth/Throat: Oropharynx is clear and moist.       TMs and canals are clear. No cracking or popping or misalignment of teh TMJs bilat.   Eyes: Conjunctivae and EOM are normal. Pupils are equal, round, and reactive to light.  Neck: Neck supple. No thyromegaly present.  Cardiovascular: Normal rate, regular rhythm and normal heart sounds.   Pulmonary/Chest: Effort normal and breath sounds normal. She has no wheezes.  Lymphadenopathy:    She has no cervical adenopathy.  Neurological: She is alert and oriented to person, place, and time.  Skin: Skin is warm and dry.  Psychiatric: She has a normal mood and affect. Her behavior is normal.            Assessment & Plan:  Hypothyroid - Discussed need for regular exercise. Will inc T3 component. Will send new rx to [facility].  Recheck TSH free T3 adn free T4 in 3 months. Want to make sure not hyperthyoid.    Anxiety  Stable. Refilled her xanax. Encouraged more regular exercise.   HRT - Will continue hte progesterone capsules and stop the cream for 6-8 weeks to see if affecting her period. If not change then needs to f/u with her gyn for an endometrial bx.    Left jaw pain - likely TMJ. Discussed NSAID for 5-10 days, jaw stretches, heat and avoiding excessive chewing like gum and tough meats. Also trial of mouthguard at night. If not gettng better then let me know.

## 2011-07-03 NOTE — Assessment & Plan Note (Signed)
Hx of ablation.

## 2011-09-14 ENCOUNTER — Telehealth: Payer: Self-pay | Admitting: *Deleted

## 2011-09-14 DIAGNOSIS — E039 Hypothyroidism, unspecified: Secondary | ICD-10-CM

## 2011-09-14 DIAGNOSIS — R899 Unspecified abnormal finding in specimens from other organs, systems and tissues: Secondary | ICD-10-CM

## 2011-09-15 ENCOUNTER — Other Ambulatory Visit: Payer: Self-pay | Admitting: Family Medicine

## 2011-09-15 LAB — PROGESTERONE: Progesterone: 5.5 ng/mL

## 2011-09-15 LAB — T3, FREE: T3, Free: 2.9 pg/mL (ref 2.3–4.2)

## 2011-09-15 LAB — T4, FREE: Free T4: 0.82 ng/dL (ref 0.80–1.80)

## 2011-09-18 ENCOUNTER — Other Ambulatory Visit: Payer: Self-pay | Admitting: *Deleted

## 2011-09-18 MED ORDER — AMBULATORY NON FORMULARY MEDICATION: Status: DC

## 2011-11-30 ENCOUNTER — Other Ambulatory Visit: Payer: Self-pay | Admitting: Family Medicine

## 2011-12-25 ENCOUNTER — Telehealth: Payer: Self-pay | Admitting: *Deleted

## 2011-12-25 DIAGNOSIS — E039 Hypothyroidism, unspecified: Secondary | ICD-10-CM

## 2011-12-25 NOTE — Telephone Encounter (Signed)
Labs entered.

## 2011-12-30 LAB — T4, FREE: Free T4: 1.03 ng/dL (ref 0.80–1.80)

## 2012-01-18 ENCOUNTER — Other Ambulatory Visit: Payer: Self-pay | Admitting: Family Medicine

## 2012-01-19 ENCOUNTER — Other Ambulatory Visit: Payer: Self-pay | Admitting: *Deleted

## 2012-01-19 MED ORDER — CITALOPRAM HYDROBROMIDE 40 MG PO TABS: 40.0000 mg | ORAL_TABLET | Freq: Every day | ORAL | Status: DC

## 2012-01-23 ENCOUNTER — Telehealth: Payer: Self-pay | Admitting: *Deleted

## 2012-01-23 NOTE — Telephone Encounter (Signed)
Pt states she was stung by three jellyfish last tues. Pt put vinegar on it and took zyrtec. Pt states it seemed to get better but now it has flared up again.Pt has welts on her legs and inner thigh where she was stung.  Pt denies any anaphylactic sxs when sting occurred or at present time.advised pt that she may still have some tentacles there so scrape with credit card and can reapply vinegar. Pt can also use benadryl topical or calamine lotion for itching. Advised pt ok to use on son also who was stung by a jelly fish at the same time.Continue to take zyrtec or any histamine. If not any better or new sxs occur in the next couple of days call back

## 2012-03-04 ENCOUNTER — Other Ambulatory Visit: Payer: Self-pay | Admitting: *Deleted

## 2012-03-04 MED ORDER — ALPRAZOLAM 0.5 MG PO TABS: ORAL_TABLET | ORAL | Status: DC

## 2012-03-15 ENCOUNTER — Telehealth: Payer: Self-pay | Admitting: *Deleted

## 2012-03-15 ENCOUNTER — Other Ambulatory Visit: Payer: Self-pay | Admitting: Family Medicine

## 2012-03-15 DIAGNOSIS — E039 Hypothyroidism, unspecified: Secondary | ICD-10-CM

## 2012-03-16 LAB — T4, FREE: Free T4: 0.9 ng/dL (ref 0.80–1.80)

## 2012-03-16 LAB — T3, FREE: T3, Free: 2.9 pg/mL (ref 2.3–4.2)

## 2012-03-18 ENCOUNTER — Encounter: Payer: Self-pay | Admitting: *Deleted

## 2012-03-18 LAB — PROGESTERONE: Progesterone: <0.2 ng/mL

## 2012-04-10 ENCOUNTER — Telehealth: Payer: Self-pay | Admitting: *Deleted

## 2012-04-10 NOTE — Telephone Encounter (Signed)
Patient called and c/o she and her 44 years old son  was on a mission trip in [place] and one of the [employer] employees had a TB test done  that showed she had exposure and everyone has been notified to talk with their doctor.  Should she come to see you or go to the health department. [phone]

## 2012-04-11 NOTE — Telephone Encounter (Signed)
She needs to come in for a TB skin test.  Her son needs to be tested too.

## 2012-04-12 ENCOUNTER — Ambulatory Visit: Payer: BC Managed Care – PPO

## 2012-04-12 ENCOUNTER — Telehealth: Payer: Self-pay | Admitting: *Deleted

## 2012-04-15 ENCOUNTER — Ambulatory Visit: Payer: BC Managed Care – PPO | Admitting: Family Medicine

## 2012-04-15 DIAGNOSIS — Z201 Contact with and (suspected) exposure to tuberculosis: Secondary | ICD-10-CM

## 2012-04-15 NOTE — Progress Notes (Addendum)
Patient ID: Sydney Bailey, female   DOB: February 18, 1966, 46 y.o.   MRN: [id number] Pt not seen. Lab order printed only.  CBC recommend TB skin, but pt wants to have serum test drawn instead. Pt informed.  Lab slip printed.

## 2012-04-17 LAB — QUANTIFERON TB GOLD ASSAY (BLOOD): Interferon Gamma Release Assay: NEGATIVE

## 2012-04-17 NOTE — Telephone Encounter (Signed)
See other message

## 2012-04-18 NOTE — Telephone Encounter (Signed)
See other message

## 2012-06-21 ENCOUNTER — Other Ambulatory Visit: Payer: Self-pay

## 2012-06-21 MED ORDER — AMBULATORY NON FORMULARY MEDICATION: Status: DC

## 2012-06-28 ENCOUNTER — Encounter: Payer: Self-pay | Admitting: Family Medicine

## 2012-06-28 ENCOUNTER — Ambulatory Visit (INDEPENDENT_AMBULATORY_CARE_PROVIDER_SITE_OTHER): Payer: BC Managed Care – PPO | Admitting: Family Medicine

## 2012-06-28 VITALS — BP 116/59 | HR 80 | Resp 18 | Wt 140.0 lb

## 2012-06-28 DIAGNOSIS — E039 Hypothyroidism, unspecified: Secondary | ICD-10-CM

## 2012-06-28 DIAGNOSIS — G4700 Insomnia, unspecified: Secondary | ICD-10-CM

## 2012-06-28 DIAGNOSIS — N920 Excessive and frequent menstruation with regular cycle: Secondary | ICD-10-CM

## 2012-06-28 DIAGNOSIS — G43909 Migraine, unspecified, not intractable, without status migrainosus: Secondary | ICD-10-CM

## 2012-06-28 NOTE — Patient Instructions (Signed)
Call for [facility] in April for Thyroid and progesterone level.

## 2012-06-28 NOTE — Progress Notes (Signed)
  Subjective:    Patient ID: Sydney Bailey, female    DOB: 08-19-1965, 47 y.o.   MRN: [id number]  HPI Was having heavy cycles even after endometrial ablation,  so stopped her progesterone cream per GYN recommendation. Still taking the progesterone caps at bedtime for sleep.  Also now on low dose birth control and that has contrlled her periods  Migraines - Well controlled right now. Rarely has to use her maxalt.  Getting about 1-2 per month.    Hypothyroid -Well controlled.  Feels well on this.  No weight changes. No skin or hair changes recenlty. Taking medication regularly. No side effects or concerns. Happy with current regimen. Last thyroid was checked in October and looked great. Lab Results  Component Value Date   TSH 2.614 03/15/2012    Insomnia - Using her progesterone caps at night. Helping.    Review of Systems     Objective:   Physical Exam  Constitutional: She is oriented to person, place, and time. She appears well-developed and well-nourished.  HENT:  Head: Normocephalic and atraumatic.  Neck: Neck supple. No thyromegaly present.  Cardiovascular: Normal rate, regular rhythm and normal heart sounds.   Pulmonary/Chest: Effort normal and breath sounds normal.  Lymphadenopathy:    She has no cervical adenopathy.  Neurological: She is alert and oriented to person, place, and time.  Skin: Skin is warm and dry.  Psychiatric: She has a normal mood and affect. Her behavior is normal.          Assessment & Plan:  Hypothyroid - Well contorlle. Recheck in 6 months.  F/U in 1 month.   Migraines - Well controlled.  Continue current regimen of when necessary Maxalt. No prophylactic treatment needed at this time.  Menorrhagia- better controlled on OC  Insomnia - Still using progesterone capusules. Working well currently.  She think she received a tetanus in our office. She says she thinks she's a patient here for the last 5-7 years. We will pull her paper chart and see  if we can find it.  She also wants and if she ever completed her [employer] series. We only have one documented in the computer but again I will check her old chart in the EMR system to see if we can find the rest of the series.

## 2012-07-15 ENCOUNTER — Other Ambulatory Visit: Payer: Self-pay | Admitting: *Deleted

## 2012-07-15 MED ORDER — CITALOPRAM HYDROBROMIDE 40 MG PO TABS: 40.0000 mg | ORAL_TABLET | Freq: Every day | ORAL | Status: DC

## 2012-09-12 ENCOUNTER — Other Ambulatory Visit: Payer: Self-pay | Admitting: *Deleted

## 2012-09-12 DIAGNOSIS — E039 Hypothyroidism, unspecified: Secondary | ICD-10-CM

## 2012-09-17 ENCOUNTER — Other Ambulatory Visit: Payer: Self-pay | Admitting: *Deleted

## 2012-09-17 DIAGNOSIS — E039 Hypothyroidism, unspecified: Secondary | ICD-10-CM

## 2012-09-18 ENCOUNTER — Other Ambulatory Visit: Payer: Self-pay | Admitting: *Deleted

## 2012-09-18 MED ORDER — AMBULATORY NON FORMULARY MEDICATION: Status: DC

## 2012-09-26 ENCOUNTER — Other Ambulatory Visit: Payer: Self-pay | Admitting: Family Medicine

## 2012-09-27 NOTE — Telephone Encounter (Signed)
Needs appt before future refills

## 2012-11-29 ENCOUNTER — Encounter: Payer: Self-pay | Admitting: Family Medicine

## 2012-11-29 ENCOUNTER — Ambulatory Visit (INDEPENDENT_AMBULATORY_CARE_PROVIDER_SITE_OTHER): Payer: BC Managed Care – PPO | Admitting: Family Medicine

## 2012-11-29 VITALS — BP 103/56 | HR 92 | Temp 98.2°F | Resp 16 | Wt 144.0 lb

## 2012-11-29 DIAGNOSIS — R5383 Other fatigue: Secondary | ICD-10-CM

## 2012-11-29 DIAGNOSIS — R1013 Epigastric pain: Secondary | ICD-10-CM

## 2012-11-29 DIAGNOSIS — R5381 Other malaise: Secondary | ICD-10-CM

## 2012-11-29 MED ORDER — DEXLANSOPRAZOLE 60 MG PO CPDR: 60.0000 mg | DELAYED_RELEASE_CAPSULE | Freq: Every day | ORAL | Status: DC

## 2012-11-29 NOTE — Progress Notes (Signed)
CC: Sydney Bailey is a 47 y.o. female is here for Fatigue and Abdominal Pain   Subjective: HPI:  Patient complains of 3 weeks of abdominal pain, fatigue, sore throat. Symptoms came on abruptly after 2 weeks they were 100% relieved while on amoxicillin after strep test and Monospot negative from urgent care clinic, she has been off of this for a little less than a week and symptoms have slowly returned however no sore throat. Fatigue is described as moderate to severe similar to when she had mono as a child.  Abdominal pain is described as a dull gnawing discomfort in the epigastric region it is nonradiating it seems to be worse after eating but can occur with an empty stomach as well. It has been associated with decreased appetite but no nausea or vomiting. Fatigue is present all hours of the day nothing makes better or worse. No interventions for either other than amoxicillin. She denies constipation, diarrhea, correlate stool, blood in stool, pelvic pain, dysuria, cough, shortness of breath, fevers, chills, new back pain, flank pain, tic bites nor rashes   Review Of Systems Outlined In HPI  Past Medical History  Diagnosis Date  . Hypothyroidism 11-08    started post- partum  . Allergy   . Anxiety   . Depression   . Migraines      History reviewed. No pertinent family history.   History  Substance Use Topics  . Smoking status: Never Smoker   . Smokeless tobacco: Not on file  . Alcohol Use: No     Objective: Filed Vitals:   11/29/12 1444  BP: 103/56  Pulse: 92  Temp: 98.2 F (36.8 C)  Resp: 16    General: Alert and Oriented, No Acute Distress HEENT: Pupils equal, round, reactive to light. Conjunctivae clear.  External ears unremarkable, canals clear with intact TMs with appropriate landmarks.  Middle ear appears open without effusion. Pink inferior turbinates.  Moist mucous membranes, pharynx without inflammation nor lesions.  Neck supple without palpable lymphadenopathy  nor abnormal masses. Lungs: Clear to auscultation bilaterally, no wheezing/ronchi/rales.  Comfortable work of breathing. Good air movement. Cardiac: Regular rate and rhythm. Normal S1/S2.  No murmurs, rubs, nor gallops.   Abdomen: She is quite tender to palpation in the epigastric region and somewhat in left upper quadrant there is no hepatosplenomegaly there is no rebound or guarding. She has normal bowel sounds without palpable masses Extremities: No peripheral edema.  Strong peripheral pulses.  Mental Status: No depression, anxiety, nor agitation. Skin: Warm and dry.  Assessment & Plan: [name] was seen today for fatigue and abdominal pain.  Diagnoses and associated orders for this visit:  Abdominal pain, epigastric - H. pylori antibody, IgG - CBC with Differential - COMPLETE METABOLIC PANEL WITH GFR  Fatigue - CBC with Differential - COMPLETE METABOLIC PANEL WITH GFR - Epstein-Barr virus VCA, IgM    Concerned she might have H. Pylori contributing to gastric/peptic ulcer we'll look at serology and she will start samples of Dexilant. Due to fatigue will rule out metabolic abnormality or anemia, will not check TSH given recent normals. I will contact her with the weekend if labs are available but she understands serology may take until next week.  Return if symptoms worsen or fail to improve.

## 2012-11-30 LAB — CBC WITH DIFFERENTIAL/PLATELET
Eosinophils Absolute: 0 10*3/uL (ref 0.0–0.7)
Hemoglobin: 13.9 g/dL (ref 12.0–15.0)
Lymphocytes Relative: 26 % (ref 12–46)
Lymphs Abs: 2 10*3/uL (ref 0.7–4.0)
MCH: 31.5 pg (ref 26.0–34.0)
MCV: 92.7 fL (ref 78.0–100.0)
Monocytes Relative: 4 % (ref 3–12)
Neutrophils Relative %: 68 % (ref 43–77)
RBC: 4.41 MIL/uL (ref 3.87–5.11)
WBC: 7.7 10*3/uL (ref 4.0–10.5)

## 2012-11-30 LAB — COMPLETE METABOLIC PANEL WITH GFR
ALT: 9 U/L (ref 0–35)
Albumin: 4.3 g/dL (ref 3.5–5.2)
CO2: 24 mEq/L (ref 19–32)
Calcium: 8.9 mg/dL (ref 8.4–10.5)
Chloride: 103 mEq/L (ref 96–112)
GFR, Est African American: >89 mL/min
GFR, Est Non African American: >89 mL/min
Glucose, Bld: 137 mg/dL — ABNORMAL HIGH (ref 70–99)
Sodium: 138 mEq/L (ref 135–145)
Total Bilirubin: 0.4 mg/dL (ref 0.3–1.2)
Total Protein: 7 g/dL (ref 6.0–8.3)

## 2012-12-02 ENCOUNTER — Telehealth: Payer: Self-pay | Admitting: Family Medicine

## 2012-12-02 DIAGNOSIS — R1013 Epigastric pain: Secondary | ICD-10-CM

## 2012-12-02 MED ORDER — OMEPRAZOLE 40 MG PO CPDR: 40.0000 mg | DELAYED_RELEASE_CAPSULE | Freq: Every day | ORAL | Status: DC

## 2012-12-02 NOTE — Telephone Encounter (Signed)
Sue Lush, Will you please let Sydney Bailey know that her H.pylori test was negative which would argue against her stomach pain being due to an infection.  The possibility of an ulcer is still high, treatment of this would include acid medications such as dexilant she got samples of.  I'd encourage her to continue with the samples until they run out, at that time switch to a more affordable substitution of omeprazole daily [facility]  She can also try adding ranididine 150mg  OTC twice a day to furtehr suppress acid.  I've placed an urgent GI referral in hopes she might be able to be seen prior to leaving the country.

## 2012-12-02 NOTE — Telephone Encounter (Signed)
Sue Lush, Could you please call [facility] and see what the wait time might be for [place]'s HPylori test from last week?

## 2012-12-03 ENCOUNTER — Ambulatory Visit (INDEPENDENT_AMBULATORY_CARE_PROVIDER_SITE_OTHER): Payer: BC Managed Care – PPO | Admitting: Nurse Practitioner

## 2012-12-03 ENCOUNTER — Encounter: Payer: Self-pay | Admitting: Nurse Practitioner

## 2012-12-03 ENCOUNTER — Ambulatory Visit: Payer: BC Managed Care – PPO | Admitting: Nurse Practitioner

## 2012-12-03 ENCOUNTER — Telehealth: Payer: Self-pay | Admitting: Gastroenterology

## 2012-12-03 VITALS — BP 100/80 | HR 60 | Ht 66.5 in | Wt 144.8 lb

## 2012-12-03 DIAGNOSIS — R1013 Epigastric pain: Secondary | ICD-10-CM

## 2012-12-03 NOTE — Telephone Encounter (Signed)
Spoke with pt & gave her results & recommendations.

## 2012-12-03 NOTE — Progress Notes (Signed)
HPI :  Patient is a 47 year old female referred here for evaluation epigastric pain. Pain started 5-6 weeks ago, was initiallly intermittent but more constant over last several days. Pain is dull, does not radiate. Pain not related to meals or activity. No significant associated nausea. BMs at baseline. Patiient takes NSAIDS a couple of times a weeks. No recent medication changes. She does takes oral birth control, mainly to regulate hormones to help with headaches. She saw PCP recently. CMP and CBC are normal. H.pylori negative. PCP gave her samples of Dexilant and called in a prescription for Prilosec. No other GI complaints. Weight up a few pounds. No blood in stool.    Past Medical History  Diagnosis Date  . Hypothyroidism 11-08    started post- partum  . Allergy   . Anxiety   . Depression   . Migraines     History reviewed. No pertinent family history. History  Substance Use Topics  . Smoking status: Never Smoker   . Smokeless tobacco: Never Used  . Alcohol Use: No   Current Outpatient Prescriptions  Medication Sig Dispense Refill  . ALPRAZolam (XANAX) 0.5 MG tablet TAKE 1/2 TO 2 TABLETS BY MOUTH EVERY 4 TO 6 HOURS AS NEEDED FOR PANIC ATTACKS  24 tablet  0  . AMBULATORY NON FORMULARY MEDICATION Progesterone capsules Take 35 mg SR progesterone capsules daily at bedtime  90 capsule  1  . AMBULATORY NON FORMULARY MEDICATION Medication Name: T4 and T3 30 mcg NON PORCINE SR capsules.  90 capsule  1  . citalopram (CELEXA) 40 MG tablet Take 1 tablet (40 mg total) by mouth daily.  90 tablet  1  . dexlansoprazole (DEXILANT) 60 MG capsule Take 1 capsule (60 mg total) by mouth daily.  10 capsule  0  . MAXALT-MLT 10 MG disintegrating tablet TAKE 1 TABLET (10 MG TOTAL) BY MOUTH ONCE AS NEEDED FOR MIGRAINE. MAY REPEAT IN 2 HOURS IF NEEDED  12 tablet  1  . norethindrone-ethinyl estradiol (JUNEL FE,GILDESS FE,LOESTRIN FE) 1-20 MG-MCG tablet Take 1 tablet by mouth daily.      Marland Kitchen omeprazole  (PRILOSEC) 40 MG capsule Take 1 capsule (40 mg total) by mouth daily.  30 capsule  1   No current facility-administered medications for this visit.   Allergies  Allergen Reactions  . Baclofen     REACTION: hives  . Cyclobenzaprine Hcl     REACTION: hives  . Etodolac   . Topamax     vertigo   Review of Systems: All systems reviewed and negative except where noted in HPI.   Physical Exam: BP 100/80  Pulse 60  Ht 5' 6.5" (1.689 m)  Wt 144 lb 12.8 oz (65.681 kg)  BMI 23.02 kg/m2 Constitutional: Pleasant,well-developed, female in no acute distress. HEENT: Normocephalic and atraumatic. Conjunctivae are normal. No scleral icterus. Neck supple.  Cardiovascular: Normal rate, regular rhythm.  Pulmonary/chest: Effort normal and breath sounds normal. No wheezing, rales or rhonchi. Abdominal: Soft, nondistended, mild epigastric tenderness. Mild to moderate RLQ tenderness.  Bowel sounds active throughout. There are no masses palpable. No hepatomegaly. Extremities: no edema Lymphadenopathy: No cervical adenopathy noted. Neurological: Alert and oriented to person place and time. Skin: Skin is warm and dry. No rashes noted. Psychiatric: Normal mood and affect. Behavior is normal.   ASSESSMENT AND PLAN:   47 year old female with 5-6 week history of non-radiating epigastric pain. Rule out PUD. For further evaluation patient will be scheduled for EGD. The risks, benefits, and alternatives  to EGD with possible biopsy and were discussed with the patient and she consents to proceed. IF EGD negative, consider abdominal ultrasound.

## 2012-12-03 NOTE — Telephone Encounter (Signed)
Pt seen at PCP's ofc for fatigue and mid epigastric pain; H. Pylori test negative. She will see Willette Cluster, NP at 3pm.

## 2012-12-03 NOTE — Patient Instructions (Addendum)

## 2012-12-04 ENCOUNTER — Encounter: Payer: Self-pay | Admitting: Nurse Practitioner

## 2012-12-04 ENCOUNTER — Ambulatory Visit (AMBULATORY_SURGERY_CENTER): Payer: BC Managed Care – PPO | Admitting: Gastroenterology

## 2012-12-04 ENCOUNTER — Encounter: Payer: Self-pay | Admitting: Gastroenterology

## 2012-12-04 VITALS — BP 113/51 | HR 82 | Temp 97.9°F | Resp 15 | Ht 66.0 in | Wt 144.0 lb

## 2012-12-04 DIAGNOSIS — R1013 Epigastric pain: Secondary | ICD-10-CM

## 2012-12-04 DIAGNOSIS — K2990 Gastroduodenitis, unspecified, without bleeding: Secondary | ICD-10-CM

## 2012-12-04 DIAGNOSIS — K2970 Gastritis, unspecified, without bleeding: Secondary | ICD-10-CM

## 2012-12-04 MED ORDER — SODIUM CHLORIDE 0.9 % IV SOLN: 500.0000 mL | INTRAVENOUS | Status: DC

## 2012-12-04 NOTE — Patient Instructions (Signed)
Per Dr. Christella Hartigan he saw gastritis on your exam today.  He said for now to continue once daily either prilosec or dexilant.  These medications are best taken 20-30 mins prior to a meal.  You may also resume your other current medications today also.  Please call if any questions or concerns.     YOU HAD AN ENDOSCOPIC PROCEDURE TODAY AT THE [facility]: Refer to the procedure report that was given to you for any specific questions about what was found during the examination.  If the procedure report does not answer your questions, please call your gastroenterologist to clarify.  If you requested that your care partner not be given the details of your procedure findings, then the procedure report has been included in a sealed envelope for you to review at your convenience later.  YOU SHOULD EXPECT: Some feelings of bloating in the abdomen. Passage of more gas than usual.  Walking can help get rid of the air that was put into your GI tract during the procedure and reduce the bloating. If you had a lower endoscopy (such as a colonoscopy or flexible sigmoidoscopy) you may notice spotting of blood in your stool or on the toilet paper. If you underwent a bowel prep for your procedure, then you may not have a normal bowel movement for a few days.  DIET: Your first meal following the procedure should be a light meal and then it is ok to progress to your normal diet.  A half-sandwich or bowl of soup is an example of a good first meal.  Heavy or fried foods are harder to digest and may make you feel nauseous or bloated.  Likewise meals heavy in dairy and vegetables can cause extra gas to form and this can also increase the bloating.  Drink plenty of fluids but you should avoid alcoholic beverages for 24 hours.  ACTIVITY: Your care partner should take you home directly after the procedure.  You should plan to take it easy, moving slowly for the rest of the day.  You can resume normal activity the day after  the procedure however you should NOT DRIVE or use heavy machinery for 24 hours (because of the sedation medicines used during the test).    SYMPTOMS TO REPORT IMMEDIATELY: A gastroenterologist can be reached at any hour.  During normal business hours, 8:30 AM to 5:00 PM Monday through Friday, call [phone].  After hours and on weekends, please call the GI answering service at [phone] who will take a message and have the physician on call contact you.     Following upper endoscopy (EGD)  Vomiting of blood or coffee ground material  New chest pain or pain under the shoulder blades  Painful or persistently difficult swallowing  New shortness of breath  Fever of 100F or higher  Black, tarry-looking stools  FOLLOW UP: If any biopsies were taken you will be contacted by phone or by letter within the next 1-3 weeks.  Call your gastroenterologist if you have not heard about the biopsies in 3 weeks.  Our staff will call the home number listed on your records the next business day following your procedure to check on you and address any questions or concerns that you may have at that time regarding the information given to you following your procedure. This is a courtesy call and so if there is no answer at the home number and we have not heard from you through the emergency physician on  call, we will assume that you have returned to your regular daily activities without incident.  SIGNATURES/CONFIDENTIALITY: You and/or your care partner have signed paperwork which will be entered into your electronic medical record.  These signatures attest to the fact that that the information above on your After Visit Summary has been reviewed and is understood.  Full responsibility of the confidentiality of this discharge information lies with you and/or your care-partner.

## 2012-12-04 NOTE — Progress Notes (Signed)
Per Dr. Christella Hartigan he spoke with Dr. Juanito Doom and he recommended to get a 12 lead EKG and if that looks normal then he does not need to be seen.  Research was called for 12 ekg. Maw  1126 research completed 12 EKG. Maw  1140 Dr. Christella Hartigan reviewed EKG and said it was benigne and ok to dc to home.  I explained this to the pt and her husband.  The pt's husband felt like he did when his wife was admitted.  He felt his blood sugar dropped and the juice and fluid helped him to recovery.  They are scheduled to go on a mission trip to [place] tomorrow and Dr. Christella Hartigan felt they were fine to go. Maw

## 2012-12-04 NOTE — Progress Notes (Signed)
Called to room to assist during endoscopic procedure.  Patient ID and intended procedure confirmed with present staff. Received instructions for my participation in the procedure from the performing physician.  

## 2012-12-04 NOTE — Op Note (Signed)
[facility] [address] [place], [address]   ENDOSCOPY PROCEDURE REPORT  PATIENT: Sydney Bailey, Sydney Bailey  MR#: [id number] BIRTHDATE: [birth year] , 47  yrs. old GENDER: Female ENDOSCOPIST: Rachael Fee, MD REFERRED BY:  Nani Gasser, M.D. PROCEDURE DATE:  12/04/2012 PROCEDURE:  EGD w/ biopsy ASA CLASS:     Class II INDICATIONS:  abdominal pain, epigastric. MEDICATIONS: Fentanyl 200 mcg IV, Versed 4 mg IV, and These medications were titrated to patient response per physician's verbal order TOPICAL ANESTHETIC: Cetacaine Spray  DESCRIPTION OF PROCEDURE: After the risks benefits and alternatives of the procedure were thoroughly explained, informed consent was obtained.  The LB [id number] endoscope was introduced through the mouth and advanced to the second portion of the duodenum. Without limitations.  The instrument was slowly withdrawn as the mucosa was fully examined.     There was mild, non-specific distal gastritis.  This was biopsied and sent to pathology.  The examination was otherwise normal, biopseis taken from normal duodenum.  Retroflexed views revealed no abnormalities.     The scope was then withdrawn from the patient and the procedure completed. COMPLICATIONS: There were no complications.  ENDOSCOPIC IMPRESSION: There was mild, non-specific distal gastritis.  This was biopsied and sent to pathology.  The examination was otherwise normal, biopseis taken from normal duodenum.  RECOMMENDATIONS: Await final pathology.  For now, continue once daily PPI (prilosec, dexilant). These are best taken 20-30 min prior to a meal.   eSigned:  Rachael Fee, MD 12/04/2012 9:42 AM

## 2012-12-04 NOTE — Progress Notes (Signed)
Pt states she awoke with a sore throat this am. Pt afebrile. Dr. Christella Hartigan notified.

## 2012-12-04 NOTE — Progress Notes (Signed)
I agree with the note above.  EGD tomorrow

## 2012-12-04 NOTE — Progress Notes (Signed)
No anesthesia complications noted VSS

## 2012-12-04 NOTE — Progress Notes (Signed)
1023 pt states, she "felt dizzy".  I lowered her head down.  Vss. Maw

## 2012-12-04 NOTE — Progress Notes (Signed)
Pt on admission to the recovery room was in sr with occasional pvc.  While she was in here she went into bigeminy.  Then in and out to SR with occ pvc.  Dr. Christella Hartigan was made aware of this rhythm change.  Brennan Bailey, CRNA was asked by Weston Brass, RN and he said pt was a sr in the procedure room.  Pt placed on o2 2 liter [place].  Pt had no complaints of any pain or any other sx. Maw  While the pt was in the recovery room, her husband told Darlyn Read, RN he felt faint.  He was pale, diphoretic and looked as if he had a seisure.  He had the stair in his eyes and could not respond while it lasted appromately 1 min.  Pt was placed on a stretcher and hooked on the monitors.  Blood sugar was taken 97. Vss.  Dr. Christella Hartigan into see pt's husband.  Per Dr. Christella Hartigan, given him 3 cups of fluid (2water and 1 OJ) to drink.  Stay on stretcher for 30 mins and monitor.  Dr. Christella Hartigan will see him before dc.maw

## 2012-12-04 NOTE — Progress Notes (Signed)
Pt given apple juice to drink.  Pt heart rhythm still in and out bigeminy.  Sr with pvc.  Dr. Christella Hartigan in to speak with pt and her husband,  He recommends she see a cardiologist today.  The pt and her husband are both going on a mission trip to [place] tomorrow for two weeks.maw

## 2012-12-05 ENCOUNTER — Telehealth: Payer: Self-pay

## 2012-12-05 NOTE — Progress Notes (Signed)
The following G-Codes are for the date of service 12-04-2012:  Patient did not experience any of the following events: a burn prior to discharge; a fall within the facility; wrong site/side/patient/procedure/implant event; or a hospital transfer or hospital admission upon discharge from the facility. (G8907)Patient did not have preoperative order for IV antibiotic SSI prophylaxis. (G8918) 

## 2012-12-05 NOTE — Telephone Encounter (Signed)
Tel # [phone] and left a message on the pt's answering machine to call if any questions or concerns. maw

## 2012-12-19 ENCOUNTER — Other Ambulatory Visit: Payer: Self-pay

## 2012-12-19 DIAGNOSIS — R109 Unspecified abdominal pain: Secondary | ICD-10-CM

## 2012-12-23 ENCOUNTER — Telehealth: Payer: Self-pay | Admitting: Gastroenterology

## 2012-12-23 ENCOUNTER — Ambulatory Visit (HOSPITAL_COMMUNITY)
Admission: RE | Admit: 2012-12-23 | Discharge: 2012-12-23 | Disposition: A | Discharge status: Home / Self Care | Payer: BC Managed Care – PPO | Source: Ambulatory Visit | Attending: Gastroenterology | Admitting: Gastroenterology

## 2012-12-23 DIAGNOSIS — R109 Unspecified abdominal pain: Secondary | ICD-10-CM | POA: Insufficient documentation

## 2012-12-23 NOTE — Telephone Encounter (Signed)
Pt advised results not yet available and Dr Christella Hartigan is out of the office until Monday and we will call her as soon as read

## 2013-01-07 ENCOUNTER — Other Ambulatory Visit: Payer: Self-pay | Admitting: Family Medicine

## 2013-01-10 ENCOUNTER — Other Ambulatory Visit: Payer: Self-pay | Admitting: Family Medicine

## 2013-01-22 ENCOUNTER — Other Ambulatory Visit: Payer: Self-pay

## 2013-01-22 MED ORDER — ESOMEPRAZOLE MAGNESIUM 40 MG PO CPDR: 40.0000 mg | DELAYED_RELEASE_CAPSULE | Freq: Every day | ORAL | Status: DC

## 2013-01-22 NOTE — Telephone Encounter (Signed)
Pt request Nexium rx because prilosec has not been working.  She has a f/u with Dr Christella Hartigan on 02/26/13 Nexium has been sent to the pharmacy

## 2013-02-26 ENCOUNTER — Encounter: Payer: Self-pay | Admitting: Gastroenterology

## 2013-02-26 ENCOUNTER — Ambulatory Visit (INDEPENDENT_AMBULATORY_CARE_PROVIDER_SITE_OTHER): Payer: BC Managed Care – PPO | Admitting: Gastroenterology

## 2013-02-26 VITALS — BP 102/64 | HR 68 | Ht 66.5 in | Wt 140.0 lb

## 2013-02-26 DIAGNOSIS — G8929 Other chronic pain: Secondary | ICD-10-CM

## 2013-02-26 DIAGNOSIS — R1013 Epigastric pain: Secondary | ICD-10-CM

## 2013-02-26 NOTE — Patient Instructions (Addendum)
You will be set up for a CT scan of abdomen and pelvis with IV and oral contrast for abd pains, weight loss. Continue nexium in AM, you should eat 20-30 min afterwards. Take one pepcid at bedtime. Referral to cardiology for intermittent palpatations.

## 2013-02-26 NOTE — Progress Notes (Signed)
Review of pertinent gastrointestinal problems: 1. Epigastric abd pain 12/2012, normal CBC, normal cmet; EGD 09/2012 Dr. Christella Hartigan found mild gastritis H pylori was neg on biopsy.  [place] 01/2013 was normal.  HPI: This is a  very pleasant 47 year old woman whom I last saw about 2 months ago.  Just back from mission trip to [place]  [place].  About 6 weeks ago.  Tried prevacid for several weeks, then increased it to twice daily.  No improvement.  Has been on nexium  Still has epigastric abd pains, intermittent, associated with nausea.  No vomiting.  Diarrhea at times.  Abd pain was the start of this 10/2012.  Had sore throat.  Sore throat improved on abx.    Sore throat is back.  epig pain is intermittent, last 2-3 hours.  Can feel mild nausea at the time.  Overall her weight is down 10 pounds, unintentionally.   Eating is not related to her pains usually.  Intermittent loose stools.  Stomach can hurt in AM.  Advil about 1 time per week for migraines.       Past Medical History  Diagnosis Date  . Hypothyroidism 11-08    started post- partum  . Allergy   . Anxiety   . Depression   . Migraines     Past Surgical History  Procedure Laterality Date  . Bunionectomy    . Essure btl      Current Outpatient Prescriptions  Medication Sig Dispense Refill  . ALPRAZolam (XANAX) 0.5 MG tablet TAKE 1/2 TO 2 TABLETS BY MOUTH EVERY 4 TO 6 HOURS AS NEEDED FOR PANIC ATTACKS.  24 tablet  0  . AMBULATORY NON FORMULARY MEDICATION Progesterone capsules Take 35 mg SR progesterone capsules daily at bedtime  90 capsule  1  . AMBULATORY NON FORMULARY MEDICATION Medication Name: T4 and T3 30 mcg NON PORCINE SR capsules.  90 capsule  1  . citalopram (CELEXA) 40 MG tablet TAKE 1 TABLET BY MOUTH EVERY DAY  90 tablet  1  . esomeprazole (NEXIUM) 40 MG capsule Take 1 capsule (40 mg total) by mouth daily before breakfast.  30 capsule  11  . norethindrone-ethinyl estradiol (JUNEL FE,GILDESS FE,LOESTRIN  FE) 1-20 MG-MCG tablet Take 1 tablet by mouth daily.      . rizatriptan (MAXALT-MLT) 10 MG disintegrating tablet TAKE 1 TABLET (10 MG TOTAL) BY MOUTH ONCE AS NEEDED FOR MIGRAINE. MAY REPEAT IN 2 HOURS IF NEEDED  12 tablet  1   No current facility-administered medications for this visit.    Allergies as of 02/26/2013 - Review Complete 02/26/2013  Allergen Reaction Noted  . Baclofen  11/22/2007  . Cyclobenzaprine hcl  11/22/2007  . Etodolac    . Topamax  10/11/2010    History reviewed. No pertinent family history.  History   Social History  . Marital Status: Married    Spouse Name: N/A    Number of Children: 3  . Years of Education: N/A   Occupational History  . Print production planner    Social History Main Topics  . Smoking status: Never Smoker   . Smokeless tobacco: Never Used  . Alcohol Use: No  . Drug Use: No  . Sexual Activity: Not on file   Other Topics Concern  . Not on file   Social History Narrative  . No narrative on file      Physical Exam: BP 102/64  Pulse 68  Ht 5' 6.5" (1.689 m)  Wt 140 lb (63.504 kg)  BMI  22.26 kg/m2 Constitutional: generally well-appearing Psychiatric: alert and oriented x3 Abdomen: soft, nontender, nondistended, no obvious ascites, no peritoneal signs, normal bowel sounds     Assessment and plan: 47 y.o. female with epigastric abdominal pain, nausea, weight loss  Still unclear etiology of her pains. These may be functional given the above workup. Her weight loss concerns me however and want to proceed with CT scan to rule out occult malignancy. She is going to add Pepcid at night as perhaps this is acid related. She has also had palpitations intermittently and during endoscopy she was found to have periodic PVCs. I performed an 12-lead EKG and that was essentially normal. She is requesting formal consult with cardiologist and I'm happy to arrange for that.

## 2013-02-27 ENCOUNTER — Other Ambulatory Visit: Payer: Self-pay

## 2013-02-27 ENCOUNTER — Institutional Professional Consult (permissible substitution): Payer: BC Managed Care – PPO | Admitting: Cardiovascular Disease

## 2013-02-27 DIAGNOSIS — R634 Abnormal weight loss: Secondary | ICD-10-CM

## 2013-02-27 DIAGNOSIS — R109 Unspecified abdominal pain: Secondary | ICD-10-CM

## 2013-02-27 DIAGNOSIS — R002 Palpitations: Secondary | ICD-10-CM

## 2013-02-27 NOTE — Progress Notes (Signed)
Cardiology appt 02/28/13 3 pm Dr Eden Emms

## 2013-03-03 ENCOUNTER — Ambulatory Visit (INDEPENDENT_AMBULATORY_CARE_PROVIDER_SITE_OTHER): Payer: BC Managed Care – PPO

## 2013-03-03 ENCOUNTER — Telehealth: Payer: Self-pay | Admitting: Gastroenterology

## 2013-03-03 DIAGNOSIS — R110 Nausea: Secondary | ICD-10-CM

## 2013-03-03 DIAGNOSIS — R109 Unspecified abdominal pain: Secondary | ICD-10-CM

## 2013-03-03 DIAGNOSIS — R197 Diarrhea, unspecified: Secondary | ICD-10-CM

## 2013-03-03 DIAGNOSIS — R634 Abnormal weight loss: Secondary | ICD-10-CM

## 2013-03-03 MED ORDER — IOHEXOL 300 MG/ML  SOLN
100.0000 mL | Freq: Once | INTRAMUSCULAR | Status: AC | PRN
  Administered 2013-03-03: 100 mL via INTRAVENOUS

## 2013-03-03 NOTE — Telephone Encounter (Signed)
Pt wanted to see if diarrhea was normal after drinking the contrast, I advised this is normal and should get better by tomorrow.  I also advised pt that Dr Christella Hartigan would be back in the office on Wed and I will call her with results

## 2013-03-05 ENCOUNTER — Telehealth: Payer: Self-pay | Admitting: Gastroenterology

## 2013-03-05 DIAGNOSIS — R109 Unspecified abdominal pain: Secondary | ICD-10-CM

## 2013-03-05 NOTE — Telephone Encounter (Signed)
Dr Christella Hartigan do you want to schedule any further testing without office visit?

## 2013-03-05 NOTE — Telephone Encounter (Signed)
She needs celiac panel.

## 2013-03-07 ENCOUNTER — Other Ambulatory Visit: Payer: Self-pay | Admitting: Family Medicine

## 2013-03-07 DIAGNOSIS — E039 Hypothyroidism, unspecified: Secondary | ICD-10-CM

## 2013-03-07 DIAGNOSIS — N920 Excessive and frequent menstruation with regular cycle: Secondary | ICD-10-CM

## 2013-03-11 LAB — TSH: TSH: 4.32 u[IU]/mL (ref 0.350–4.500)

## 2013-03-12 ENCOUNTER — Encounter: Payer: Self-pay | Admitting: *Deleted

## 2013-03-12 LAB — PROGESTERONE: Progesterone: 1.3 ng/mL

## 2013-03-13 ENCOUNTER — Telehealth: Payer: Self-pay | Admitting: Gastroenterology

## 2013-03-13 NOTE — Telephone Encounter (Signed)
Pt agreed, she cancelled appt she had in Oct because of conflicting plans but will call back to schedule another appt tomorrow

## 2013-03-13 NOTE — Telephone Encounter (Signed)
[facility] Celiac panel 03/11/2013 was normal (please call her to inform her of that).  In past 3 months, normal labs, normal CT, normal [place], normal EGD.  Her pains are either functional GI related or are not related to her GI tract. No further testing recommended. ROV in 3-4 weeks to discuss symptoms.

## 2013-03-19 ENCOUNTER — Ambulatory Visit (INDEPENDENT_AMBULATORY_CARE_PROVIDER_SITE_OTHER): Payer: BC Managed Care – PPO | Admitting: Cardiology

## 2013-03-19 ENCOUNTER — Encounter: Payer: Self-pay | Admitting: Cardiology

## 2013-03-19 VITALS — BP 120/84 | HR 68 | Ht 66.0 in | Wt 143.0 lb

## 2013-03-19 DIAGNOSIS — R0600 Dyspnea, unspecified: Secondary | ICD-10-CM | POA: Insufficient documentation

## 2013-03-19 DIAGNOSIS — R002 Palpitations: Secondary | ICD-10-CM

## 2013-03-19 DIAGNOSIS — R0609 Other forms of dyspnea: Secondary | ICD-10-CM

## 2013-03-19 NOTE — Assessment & Plan Note (Signed)
Patient has documented PVCs. She also has dyspnea on exertion. Schedule echocardiogram to quantify LV function. She does not appear to be particular symptomatic. We could consider a beta blocker in the future if her symptoms worsen or a monitor if her palpitations are different. She is in agreement with this.

## 2013-03-19 NOTE — Patient Instructions (Signed)
Your physician recommends that you schedule a follow-up appointment in: AS NEEDED PENDING TEST RESULTS  Your physician has requested that you have an echocardiogram. Echocardiography is a painless test that uses sound waves to create images of your heart. It provides your doctor with information about the size and shape of your heart and how well your heart's chambers and valves are working. This procedure takes approximately one hour. There are no restrictions for this procedure.    

## 2013-03-19 NOTE — Assessment & Plan Note (Signed)
Echocardiogram to quantify LV function.

## 2013-03-19 NOTE — Progress Notes (Signed)
     HPI: 47 year old female for evaluation of palpitations. Patient recently undergoing evaluation for abdominal pain. During endoscopy she was noted to have PVCs. She notes occasional palpitations described as a skip. She also has dyspnea on exertion which is chronic. No orthopnea, PND, pedal edema, exertional chest pain for history of syncope.  Current Outpatient Prescriptions  Medication Sig Dispense Refill  . ALPRAZolam (XANAX) 0.5 MG tablet TAKE 1/2 TO 2 TABLETS BY MOUTH EVERY 4 TO 6 HOURS AS NEEDED FOR PANIC ATTACKS.  24 tablet  0  . AMBULATORY NON FORMULARY MEDICATION Progesterone capsules Take 35 mg SR progesterone capsules daily at bedtime  90 capsule  1  . AMBULATORY NON FORMULARY MEDICATION Medication Name: T4 and T3 30 mcg NON PORCINE SR capsules.  90 capsule  1  . citalopram (CELEXA) 40 MG tablet TAKE 1 TABLET BY MOUTH EVERY DAY  90 tablet  1  . esomeprazole (NEXIUM) 40 MG capsule Take 1 capsule (40 mg total) by mouth daily before breakfast.  30 capsule  11  . norethindrone-ethinyl estradiol (JUNEL FE,GILDESS FE,LOESTRIN FE) 1-20 MG-MCG tablet Take 1 tablet by mouth daily.      . rizatriptan (MAXALT-MLT) 10 MG disintegrating tablet TAKE 1 TABLET (10 MG TOTAL) BY MOUTH ONCE AS NEEDED FOR MIGRAINE. MAY REPEAT IN 2 HOURS IF NEEDED  12 tablet  1   No current facility-administered medications for this visit.    Allergies  Allergen Reactions  . Baclofen     REACTION: hives  . Cyclobenzaprine Hcl     REACTION: hives  . Etodolac   . Topamax     vertigo    Past Medical History  Diagnosis Date  . Hypothyroidism 11-08    started post- partum  . Allergy   . Anxiety   . Depression   . Migraines     Past Surgical History  Procedure Laterality Date  . Bunionectomy    . Essure btl      History   Social History  . Marital Status: Married    Spouse Name: N/A    Number of Children: 3  . Years of Education: N/A   Occupational History  . Print production planner     Social History Main Topics  . Smoking status: Never Smoker   . Smokeless tobacco: Never Used  . Alcohol Use: No  . Drug Use: No  . Sexual Activity: Not on file   Other Topics Concern  . Not on file   Social History Narrative  . No narrative on file    No family history on file.  ROS: no fevers or chills, productive cough, hemoptysis, dysphasia, odynophagia, melena, hematochezia, dysuria, hematuria, rash, seizure activity, orthopnea, PND, pedal edema, claudication. Remaining systems are negative.  Physical Exam:   Blood pressure 120/84, pulse 68, height 5\' 6"  (1.676 m), weight 143 lb (64.864 kg).  General:  Well developed/well nourished in NAD Skin warm/dry Patient not depressed No peripheral clubbing Back-normal HEENT-normal/normal eyelids Neck supple/normal carotid upstroke bilaterally; no bruits; no JVD; no thyromegaly chest - CTA/ normal expansion CV - RRR/normal S1 and S2; no murmurs, rubs or gallops;  PMI nondisplaced Abdomen -NT/ND, no HSM, no mass, + bowel sounds, no bruit 2+ femoral pulses, no bruits Ext-no edema, chords, 2+ DP Neuro-grossly nonfocal  ECG sinus rhythm with no ST changes.

## 2013-03-24 ENCOUNTER — Encounter: Payer: Self-pay | Admitting: Gastroenterology

## 2013-03-31 ENCOUNTER — Telehealth: Payer: Self-pay | Admitting: Gastroenterology

## 2013-03-31 ENCOUNTER — Ambulatory Visit: Payer: BC Managed Care – PPO | Admitting: Gastroenterology

## 2013-03-31 NOTE — Telephone Encounter (Signed)
No hernia is noted in her records, pt would like to see Dr Christella Hartigan to discuss continued abd pain

## 2013-04-08 ENCOUNTER — Ambulatory Visit (HOSPITAL_COMMUNITY): Payer: BC Managed Care – PPO | Attending: Cardiovascular Disease | Admitting: Radiology

## 2013-04-08 DIAGNOSIS — R0609 Other forms of dyspnea: Secondary | ICD-10-CM

## 2013-04-08 DIAGNOSIS — R0600 Dyspnea, unspecified: Secondary | ICD-10-CM

## 2013-04-08 DIAGNOSIS — R002 Palpitations: Secondary | ICD-10-CM | POA: Insufficient documentation

## 2013-04-08 DIAGNOSIS — E039 Hypothyroidism, unspecified: Secondary | ICD-10-CM | POA: Insufficient documentation

## 2013-04-08 NOTE — Progress Notes (Signed)
Echocardiogram performed.  

## 2013-04-18 ENCOUNTER — Encounter: Payer: Self-pay | Admitting: Gastroenterology

## 2013-04-18 ENCOUNTER — Ambulatory Visit (INDEPENDENT_AMBULATORY_CARE_PROVIDER_SITE_OTHER): Payer: BC Managed Care – PPO | Admitting: Gastroenterology

## 2013-04-18 VITALS — BP 122/70 | HR 70 | Ht 66.5 in | Wt 143.0 lb

## 2013-04-18 DIAGNOSIS — R1013 Epigastric pain: Secondary | ICD-10-CM

## 2013-04-18 DIAGNOSIS — G8929 Other chronic pain: Secondary | ICD-10-CM

## 2013-04-18 NOTE — Progress Notes (Signed)
Review of pertinent gastrointestinal problems: 1. Epigastric abd pain 12/2012, normal CBC, normal cmet; EGD 09/2012 Dr. Christella Hartigan found mild gastritis H pylori was neg on biopsy.  [place] 01/2013 was normal. IMPRESSION from CT scan 02/2013 No acute findings identified within the abdomen or pelvis.  Celiac panel 03/2013 normal.  HPI: This is a   very pleasant 47 year old woman whom I last saw about 2 months ago.  She has not as acute  2 1/2 weeks ago had epigastric pains, can be acute.  Can also have general abd discomfort.  Avoided carbonation.  No clear food that cause.  Can last for hours.  Sometimes sharp and stabbing.  Sometimes cramping with BMs.  Overall stable weight.  NO overt GI bleeding.  nexium one pill once daily.   Past Medical History  Diagnosis Date  . Hypothyroidism 11-08    started post- partum  . Allergy   . Anxiety   . Depression   . Migraines     Past Surgical History  Procedure Laterality Date  . Bunionectomy    . Essure btl      Current Outpatient Prescriptions  Medication Sig Dispense Refill  . ALPRAZolam (XANAX) 0.5 MG tablet TAKE 1/2 TO 2 TABLETS BY MOUTH EVERY 4 TO 6 HOURS AS NEEDED FOR PANIC ATTACKS.  24 tablet  0  . AMBULATORY NON FORMULARY MEDICATION Progesterone capsules Take 35 mg SR progesterone capsules daily at bedtime  90 capsule  1  . AMBULATORY NON FORMULARY MEDICATION Medication Name: T4 and T3 30 mcg NON PORCINE SR capsules.  90 capsule  1  . citalopram (CELEXA) 40 MG tablet TAKE 1 TABLET BY MOUTH EVERY DAY  90 tablet  1  . esomeprazole (NEXIUM) 40 MG capsule Take 1 capsule (40 mg total) by mouth daily before breakfast.  30 capsule  11  . norethindrone-ethinyl estradiol (JUNEL FE,GILDESS FE,LOESTRIN FE) 1-20 MG-MCG tablet Take 1 tablet by mouth daily.      . rizatriptan (MAXALT-MLT) 10 MG disintegrating tablet TAKE 1 TABLET (10 MG TOTAL) BY MOUTH ONCE AS NEEDED FOR MIGRAINE. MAY REPEAT IN 2 HOURS IF NEEDED  12 tablet  1   No current  facility-administered medications for this visit.    Allergies as of 04/18/2013 - Review Complete 04/18/2013  Allergen Reaction Noted  . Baclofen  11/22/2007  . Cyclobenzaprine hcl  11/22/2007  . Etodolac    . Topamax  10/11/2010    Family History  Problem Relation Age of Onset  . Heart disease Father     Atrial fibrillation    History   Social History  . Marital Status: Married    Spouse Name: N/A    Number of Children: 3  . Years of Education: N/A   Occupational History  . Print production planner    Social History Main Topics  . Smoking status: Never Smoker   . Smokeless tobacco: Never Used  . Alcohol Use: No  . Drug Use: No  . Sexual Activity: Not on file   Other Topics Concern  . Not on file   Social History Narrative  . No narrative on file      Physical Exam: BP 122/70  Pulse 70  Ht 5' 6.5" (1.689 m)  Wt 143 lb (64.864 kg)  BMI 22.74 kg/m2 Constitutional: generally well-appearing Psychiatric: alert and oriented x3 Abdomen: soft, nontender, nondistended, no obvious ascites, no peritoneal signs, normal bowel sounds     Assessment and plan: 47 y.o. female with intermittent epigastric pains  Still  unclear etiology however perhaps she has biliary dyskinesia. We will proceed with HIDA scan at her soonest convenience to estimate gallbladder ejection fraction. If this is significantly abnormal then I would refer her to a general surgeon to consider laparoscopic cholecystectomy.

## 2013-04-18 NOTE — Patient Instructions (Addendum)
You will be set up for a HIDA scan of GB to check for biliary dyskinesia. You have been scheduled for a HIDA scan at [facility] Radiology (1st floor) on 05/07/13. Please arrive 15 minutes prior to your scheduled appointment at 730 am. Make certain not to have anything to eat or drink at least 6 hours prior to your test. Should this appointment date or time not work well for you, please call radiology scheduling at [phone].  _____________________________________________________________________ hepatobiliary (HIDA) scan is an imaging procedure used to diagnose problems in the liver, gallbladder and bile ducts. In the HIDA scan, a radioactive chemical or tracer is injected into a vein in your arm. The tracer is handled by the liver like bile. Bile is a fluid produced and excreted by your liver that helps your digestive system break down fats in the foods you eat. Bile is stored in your gallbladder and the gallbladder releases the bile when you eat a meal. A special nuclear medicine scanner (gamma camera) tracks the flow of the tracer from your liver into your gallbladder and small intestine.  During your HIDA scan  You'll be asked to change into a hospital gown before your HIDA scan begins. Your health care team will position you on a table, usually on your back. The radioactive tracer is then injected into a vein in your arm.The tracer travels through your bloodstream to your liver, where it's taken up by the bile-producing cells. The radioactive tracer travels with the bile from your liver into your gallbladder and through your bile ducts to your small intestine.You may feel some pressure while the radioactive tracer is injected into your vein. As you lie on the table, a special gamma camera is positioned over your abdomen taking pictures of the tracer as it moves through your body. The gamma camera takes pictures continually for about an hour. You'll need to keep still during the HIDA scan. This can become  uncomfortable, but you may find that you can lessen the discomfort by taking deep breaths and thinking about other things. Tell your health care team if you're uncomfortable. The radiologist will watch on a computer the progress of the radioactive tracer through your body. The HIDA scan may be stopped when the radioactive tracer is seen in the gallbladder and enters your small intestine. This typically takes about an hour. In some cases extra imaging will be performed if original images aren't satisfactory, if morphine is given to help visualize the gallbladder or if the medication CCK is given to look at the contraction of the gallbladder. This test typically takes 2 hours to complete. ________________________________________________________________________

## 2013-05-02 ENCOUNTER — Ambulatory Visit (HOSPITAL_COMMUNITY)
Admission: RE | Admit: 2013-05-02 | Discharge: 2013-05-02 | Disposition: A | Discharge status: Home / Self Care | Payer: BC Managed Care – PPO | Source: Ambulatory Visit | Attending: Gastroenterology | Admitting: Gastroenterology

## 2013-05-02 DIAGNOSIS — G8929 Other chronic pain: Secondary | ICD-10-CM

## 2013-05-02 DIAGNOSIS — R109 Unspecified abdominal pain: Secondary | ICD-10-CM | POA: Insufficient documentation

## 2013-05-02 MED ORDER — SINCALIDE 5 MCG IJ SOLR
0.0200 ug/kg | Freq: Once | INTRAMUSCULAR | Status: AC
  Administered 2013-05-02: 08:00:00 via INTRAVENOUS

## 2013-05-02 MED ORDER — STERILE WATER FOR INJECTION IJ SOLN
INTRAMUSCULAR | Status: AC
  Filled 2013-05-02: qty 10

## 2013-05-02 MED ORDER — SODIUM CHLORIDE 0.9 % IV SOLN
Freq: Once | INTRAVENOUS | Status: AC
  Administered 2013-05-02: 08:00:00 via INTRAVENOUS

## 2013-05-02 MED ORDER — TECHNETIUM TC 99M MEBROFENIN IV KIT
5.0000 | PACK | Freq: Once | INTRAVENOUS | Status: AC | PRN
  Administered 2013-05-02: 5 via INTRAVENOUS

## 2013-05-02 MED ORDER — STERILE WATER FOR INJECTION IJ SOLN
5.0000 mL | Freq: Once | INTRAMUSCULAR | Status: AC
  Administered 2013-05-02: 5 mL via INTRAMUSCULAR
  Filled 2013-05-02: qty 5

## 2013-05-02 MED ORDER — SINCALIDE 5 MCG IJ SOLR
INTRAMUSCULAR | Status: AC
  Filled 2013-05-02: qty 5

## 2013-05-06 ENCOUNTER — Other Ambulatory Visit: Payer: Self-pay

## 2013-05-06 ENCOUNTER — Telehealth: Payer: Self-pay

## 2013-05-06 DIAGNOSIS — K8020 Calculus of gallbladder without cholecystitis without obstruction: Secondary | ICD-10-CM

## 2013-05-06 NOTE — Telephone Encounter (Signed)
Janice to notify pt  

## 2013-05-06 NOTE — Telephone Encounter (Signed)
Message copied by Donata Duff on Tue May 06, 2013  9:29 AM ------      Message from: Zacarias Pontes      Created: Tue May 06, 2013  9:18 AM       Pt has an pt scheduled for 12-9 arrive at 10.00am for a 10.30am apt with Dr Abigail Miyamoto.Marland KitchenMarland KitchenThanks Liborio Nixon      ----- Message -----         From: Donata Duff, CMA         Sent: 05/06/2013   8:44 AM           To: Zacarias Pontes            The HIDA scan was somewhat abnormal, she had pain with infusion of CCK. Collect referred to general surgery Pt prefers not to be scheduled with Dr Michaell Cowing       ------

## 2013-05-07 ENCOUNTER — Other Ambulatory Visit (HOSPITAL_COMMUNITY): Payer: BC Managed Care – PPO

## 2013-05-13 ENCOUNTER — Ambulatory Visit (INDEPENDENT_AMBULATORY_CARE_PROVIDER_SITE_OTHER): Payer: BC Managed Care – PPO | Admitting: Surgery

## 2013-05-13 ENCOUNTER — Encounter (INDEPENDENT_AMBULATORY_CARE_PROVIDER_SITE_OTHER): Payer: Self-pay

## 2013-05-13 ENCOUNTER — Encounter (INDEPENDENT_AMBULATORY_CARE_PROVIDER_SITE_OTHER): Payer: Self-pay | Admitting: Surgery

## 2013-05-13 VITALS — BP 116/76 | HR 72 | Temp 98.8°F | Resp 14 | Ht 66.5 in | Wt 145.8 lb

## 2013-05-13 DIAGNOSIS — K828 Other specified diseases of gallbladder: Secondary | ICD-10-CM

## 2013-05-13 NOTE — Progress Notes (Signed)
Patient ID: Sydney Bailey, female   DOB: [birth year], 47 y.o.   MRN: [id number]  Chief Complaint  Patient presents with  . New Evaluation    eval possible gb    HPI Sydney Bailey is a 47 y.o. female.   HPI This is a very pleasant female referred to me by Dr. Linford Bailey for evaluation of abdominal pain. She started having discomfort and just not feeling well in April 2014. Her pain is in the epigastrium and right upper quadrant. She also has nausea but no vomiting. The pain is sharp in and moderate in intensity. She has a difficult time relating it to particular foods.  Bowel movements are normal. The discomfort occurs daily. She has had a complete workup including evaluation by gastroenterologist. Past Medical History  Diagnosis Date  . Hypothyroidism 11-08    started post- partum  . Allergy   . Anxiety   . Depression   . Migraines     Past Surgical History  Procedure Laterality Date  . Bunionectomy    . Essure btl      Family History  Problem Relation Age of Onset  . Heart disease Father     Atrial fibrillation  . Cancer Maternal Aunt     breast  . Cancer Paternal Aunt     non hodgkins lymphoma  . Cancer Maternal Grandmother     breast    Social History History  Substance Use Topics  . Smoking status: Never Smoker   . Smokeless tobacco: Never Used  . Alcohol Use: No    Allergies  Allergen Reactions  . Baclofen     REACTION: hives  . Cyclobenzaprine Hcl     REACTION: hives  . Etodolac   . Topamax     vertigo    Current Outpatient Prescriptions  Medication Sig Dispense Refill  . ALPRAZolam (XANAX) 0.5 MG tablet TAKE 1/2 TO 2 TABLETS BY MOUTH EVERY 4 TO 6 HOURS AS NEEDED FOR PANIC ATTACKS.  24 tablet  0  . AMBULATORY NON FORMULARY MEDICATION Progesterone capsules Take 35 mg SR progesterone capsules daily at bedtime  90 capsule  1  . AMBULATORY NON FORMULARY MEDICATION Medication Name: T4 and T3 30 mcg NON PORCINE SR capsules.  90 capsule  1  .  citalopram (CELEXA) 40 MG tablet TAKE 1 TABLET BY MOUTH EVERY DAY  90 tablet  1  . norethindrone-ethinyl estradiol (JUNEL FE,GILDESS FE,LOESTRIN FE) 1-20 MG-MCG tablet Take 1 tablet by mouth daily.      . rizatriptan (MAXALT-MLT) 10 MG disintegrating tablet TAKE 1 TABLET (10 MG TOTAL) BY MOUTH ONCE AS NEEDED FOR MIGRAINE. MAY REPEAT IN 2 HOURS IF NEEDED  12 tablet  1   No current facility-administered medications for this visit.    Review of Systems Review of Systems  Constitutional: Negative for fever, chills and unexpected weight change.  HENT: Negative for congestion, hearing loss, sore throat, trouble swallowing and voice change.   Eyes: Negative for visual disturbance.  Respiratory: Negative for cough and wheezing.   Cardiovascular: Negative for chest pain, palpitations and leg swelling.  Gastrointestinal: Positive for nausea and abdominal pain. Negative for vomiting, diarrhea, constipation, blood in stool, abdominal distention and anal bleeding.  Genitourinary: Negative for hematuria, vaginal bleeding and difficulty urinating.  Musculoskeletal: Negative for arthralgias.  Skin: Negative for rash and wound.  Neurological: Positive for headaches. Negative for seizures and syncope.  Hematological: Negative for adenopathy. Does not bruise/bleed easily.  Psychiatric/Behavioral: Negative for confusion.  Blood pressure 116/76, pulse 72, temperature 98.8 F (37.1 C), temperature source Temporal, resp. rate 14, height 5' 6.5" (1.689 m), weight 145 lb 12.8 oz (66.134 kg).  Physical Exam Physical Exam  Constitutional: She is oriented to person, place, and time. She appears well-developed and well-nourished. No distress.  HENT:  Head: Normocephalic and atraumatic.  Right Ear: External ear normal.  Left Ear: External ear normal.  Nose: Nose normal.  Mouth/Throat: Oropharynx is clear and moist.  Eyes: Conjunctivae are normal. Pupils are equal, round, and reactive to light. Right eye  exhibits no discharge. Left eye exhibits no discharge. No scleral icterus.  Neck: Normal range of motion. Neck supple. No tracheal deviation present. No thyromegaly present.  Cardiovascular: Normal rate, regular rhythm, normal heart sounds and intact distal pulses.   No murmur heard. Pulmonary/Chest: Effort normal and breath sounds normal. No respiratory distress. She has no wheezes. She has no rales.  Abdominal: Soft. Bowel sounds are normal. There is tenderness.  There is mild tenderness with no guarding in the right upper quadrant and epigastric  Musculoskeletal: Normal range of motion. She exhibits no edema and no tenderness.  Lymphadenopathy:    She has no cervical adenopathy.  Neurological: She is alert and oriented to person, place, and time.  Skin: Skin is warm and dry. She is not diaphoretic.  Psychiatric: Her behavior is normal. Judgment normal.    Data Reviewed I have reviewed the patient's data. Her CAT scan and ultrasound are normal. Her HIDA shows a 56% Gallbladder  Ejection fraction. Her symptoms were reproduced with CCK administration  Assessment    Biliary dyskinesia and I suspect some chronic cholecystitis     Plan    I discussed the diagnosis with her in detail. I discussed laparoscopic cholecystectomy as well. She went to proceed with laparoscopic cholecystectomy. I discussed the risks of surgery which includes but is not limited to bleeding, infection, bile duct injury, bile leak, injury to other structures, need to convert to an open procedure, and the chance this may not resolve her symptoms. She understands and wishes to proceed. Surgery will be scheduled. I also discussed postoperative recovery.        Sydney Bailey A 05/13/2013, 11:03 AM

## 2013-05-19 ENCOUNTER — Encounter (HOSPITAL_COMMUNITY): Payer: Self-pay | Admitting: Pharmacy Technician

## 2013-05-20 ENCOUNTER — Encounter (HOSPITAL_COMMUNITY): Payer: Self-pay

## 2013-05-20 ENCOUNTER — Encounter (HOSPITAL_COMMUNITY)
Admission: RE | Admit: 2013-05-20 | Discharge: 2013-05-20 | Disposition: A | Discharge status: Home / Self Care | Payer: BC Managed Care – PPO | Source: Ambulatory Visit | Attending: Surgery | Admitting: Surgery

## 2013-05-20 ENCOUNTER — Ambulatory Visit (HOSPITAL_COMMUNITY)
Admission: RE | Admit: 2013-05-20 | Discharge: 2013-05-20 | Disposition: A | Discharge status: Home / Self Care | Payer: BC Managed Care – PPO | Source: Ambulatory Visit | Attending: Anesthesiology | Admitting: Anesthesiology

## 2013-05-20 DIAGNOSIS — R0602 Shortness of breath: Secondary | ICD-10-CM | POA: Insufficient documentation

## 2013-05-20 DIAGNOSIS — R079 Chest pain, unspecified: Secondary | ICD-10-CM | POA: Insufficient documentation

## 2013-05-20 DIAGNOSIS — Z01812 Encounter for preprocedural laboratory examination: Secondary | ICD-10-CM | POA: Insufficient documentation

## 2013-05-20 DIAGNOSIS — Z01818 Encounter for other preprocedural examination: Secondary | ICD-10-CM | POA: Insufficient documentation

## 2013-05-20 HISTORY — DX: Family history of other specified conditions: Z84.89

## 2013-05-20 HISTORY — DX: Other specified postprocedural states: R11.2

## 2013-05-20 HISTORY — DX: Palpitations: R00.2

## 2013-05-20 HISTORY — DX: Other specified postprocedural states: Z98.890

## 2013-05-20 HISTORY — DX: Ventricular premature depolarization: I49.3

## 2013-05-20 LAB — CBC
MCH: 33 pg (ref 26.0–34.0)
MCHC: 34.6 g/dL (ref 30.0–36.0)
MCV: 95.5 fL (ref 78.0–100.0)
Platelets: 302 10*3/uL (ref 150–400)
RDW: 12.2 % (ref 11.5–15.5)

## 2013-05-20 LAB — HCG, SERUM, QUALITATIVE: Preg, Serum: NEGATIVE

## 2013-05-20 NOTE — Progress Notes (Signed)
Patient informed Nurse that she had some PVC's during endoscopy procedure and was sent to see Dr. Jens Som at [place]. Patient informed Nurse that she has some shortness of breath at times when she has the PVC's. Patient informed Nurse that she can feel the PVC's today but it is not any different than her last office visit with Dr. Jens Som on 03/19/13. EKG will not be repeated, however Nurse will obtain a chest xray today.

## 2013-05-20 NOTE — Pre-Procedure Instructions (Signed)
Sydney Bailey  05/20/2013   Your procedure is scheduled on:  Thursday, May 22, 2013 @ 9:30 AM  Report to [facility] Short Stay (use Main Entrance "A'') at 6:30 AM.  Call this number if you have problems the morning of surgery: [phone]   Remember:   Do not eat food or drink liquids after midnight.   Take these medicines the morning of surgery with A SIP OF WATER: ALPRAZolam (XANAX) 0.5 MG tablet for panic attacks, and T4 thyroid medication Stop taking Aspirin, vitamins and herbal medications. Do not take any NSAIDs ie: Ibuprofen, Advil, Naproxen or any medication containing Aspirin.    Do not wear jewelry, make-up or nail polish.  Do not wear lotions, powders, or perfumes. You may wear deodorant.  Do not shave 48 hours prior to surgery.   Do not bring valuables to the hospital.  [facility] is not responsible for any belongings or valuables.               Contacts, dentures or bridgework may not be worn into surgery.  Leave suitcase in the car. After surgery it may be brought to your room.  For patients admitted to the hospital, discharge time is determined by your treatment team.               Patients discharged the day of surgery will not be allowed to drive home.  Name and phone number of your driver:   Special Instructions: Shower using CHG 2 nights before surgery and the night before surgery.  If you shower the day of surgery use CHG.  Use special wash - you have one bottle of CHG for all showers.  You should use approximately 1/3 of the bottle for each shower.   Please read over the following fact sheets that you were given: Pain Booklet, Coughing and Deep Breathing and Surgical Site Infection Prevention

## 2013-05-21 MED ORDER — CEFAZOLIN SODIUM-DEXTROSE 2-3 GM-% IV SOLR
2.0000 g | INTRAVENOUS | Status: AC
  Administered 2013-05-22: 2 g via INTRAVENOUS
  Filled 2013-05-21: qty 50

## 2013-05-21 NOTE — H&P (Signed)
Chief Complaint   Patient presents with   .  New Evaluation     eval possible gb   HPI  Sydney Bailey is a 47 y.o. female.  HPI  This is a very pleasant female referred to me by Dr. Linford Arnold for evaluation of abdominal pain. She started having discomfort and just not feeling well in April 2014. Her pain is in the epigastrium and right upper quadrant. She also has nausea but no vomiting. The pain is sharp in and moderate in intensity. She has a difficult time relating it to particular foods. Bowel movements are normal. The discomfort occurs daily. She has had a complete workup including evaluation by gastroenterologist.  Past Medical History   Diagnosis  Date   .  Hypothyroidism  11-08     started post- partum   .  Allergy    .  Anxiety    .  Depression    .  Migraines     Past Surgical History   Procedure  Laterality  Date   .  Bunionectomy     .  Essure btl      Family History   Problem  Relation  Age of Onset   .  Heart disease  Father      Atrial fibrillation   .  Cancer  Maternal Aunt      breast   .  Cancer  Paternal Aunt      non hodgkins lymphoma   .  Cancer  Maternal Grandmother      breast   Social History  History   Substance Use Topics   .  Smoking status:  Never Smoker   .  Smokeless tobacco:  Never Used   .  Alcohol Use:  No    Allergies   Allergen  Reactions   .  Baclofen      REACTION: hives   .  Cyclobenzaprine Hcl      REACTION: hives   .  Etodolac    .  Topamax      vertigo    Current Outpatient Prescriptions   Medication  Sig  Dispense  Refill   .  ALPRAZolam (XANAX) 0.5 MG tablet  TAKE 1/2 TO 2 TABLETS BY MOUTH EVERY 4 TO 6 HOURS AS NEEDED FOR PANIC ATTACKS.  24 tablet  0   .  AMBULATORY NON FORMULARY MEDICATION  Progesterone capsules  Take 35 mg SR progesterone capsules daily at bedtime  90 capsule  1   .  AMBULATORY NON FORMULARY MEDICATION  Medication Name: T4 and T3 30 mcg NON PORCINE SR capsules.  90 capsule  1   .   citalopram (CELEXA) 40 MG tablet  TAKE 1 TABLET BY MOUTH EVERY DAY  90 tablet  1   .  norethindrone-ethinyl estradiol (JUNEL FE,GILDESS FE,LOESTRIN FE) 1-20 MG-MCG tablet  Take 1 tablet by mouth daily.     .  rizatriptan (MAXALT-MLT) 10 MG disintegrating tablet  TAKE 1 TABLET (10 MG TOTAL) BY MOUTH ONCE AS NEEDED FOR MIGRAINE. MAY REPEAT IN 2 HOURS IF NEEDED  12 tablet  1    No current facility-administered medications for this visit.   Review of Systems  Review of Systems  Constitutional: Negative for fever, chills and unexpected weight change.  HENT: Negative for congestion, hearing loss, sore throat, trouble swallowing and voice change.  Eyes: Negative for visual disturbance.  Respiratory: Negative for cough and wheezing.  Cardiovascular: Negative for chest pain, palpitations and leg swelling.  Gastrointestinal: Positive for nausea and abdominal pain. Negative for vomiting, diarrhea, constipation, blood in stool, abdominal distention and anal bleeding.  Genitourinary: Negative for hematuria, vaginal bleeding and difficulty urinating.  Musculoskeletal: Negative for arthralgias.  Skin: Negative for rash and wound.  Neurological: Positive for headaches. Negative for seizures and syncope.  Hematological: Negative for adenopathy. Does not bruise/bleed easily.  Psychiatric/Behavioral: Negative for confusion.  Blood pressure 116/76, pulse 72, temperature 98.8 F (37.1 C), temperature source Temporal, resp. rate 14, height 5' 6.5" (1.689 m), weight 145 lb 12.8 oz (66.134 kg).  Physical Exam  Physical Exam  Constitutional: She is oriented to person, place, and time. She appears well-developed and well-nourished. No distress.  HENT:  Head: Normocephalic and atraumatic.  Right Ear: External ear normal.  Left Ear: External ear normal.  Nose: Nose normal.  Mouth/Throat: Oropharynx is clear and moist.  Eyes: Conjunctivae are normal. Pupils are equal, round, and reactive to light. Right eye  exhibits no discharge. Left eye exhibits no discharge. No scleral icterus.  Neck: Normal range of motion. Neck supple. No tracheal deviation present. No thyromegaly present.  Cardiovascular: Normal rate, regular rhythm, normal heart sounds and intact distal pulses.  No murmur heard.  Pulmonary/Chest: Effort normal and breath sounds normal. No respiratory distress. She has no wheezes. She has no rales.  Abdominal: Soft. Bowel sounds are normal. There is tenderness.  There is mild tenderness with no guarding in the right upper quadrant and epigastric  Musculoskeletal: Normal range of motion. She exhibits no edema and no tenderness.  Lymphadenopathy:  She has no cervical adenopathy.  Neurological: She is alert and oriented to person, place, and time.  Skin: Skin is warm and dry. She is not diaphoretic.  Psychiatric: Her behavior is normal. Judgment normal.  Data Reviewed  I have reviewed the patient's data. Her CAT scan and ultrasound are normal. Her HIDA shows a 56% Gallbladder Ejection fraction. Her symptoms were reproduced with CCK administration  Assessment  Biliary dyskinesia and I suspect some chronic cholecystitis  Plan  I discussed the diagnosis with her in detail. I discussed laparoscopic cholecystectomy as well. She went to proceed with laparoscopic cholecystectomy. I discussed the risks of surgery which includes but is not limited to bleeding, infection, bile duct injury, bile leak, injury to other structures, need to convert to an open procedure, and the chance this may not resolve her symptoms. She understands and wishes to proceed. Surgery will be scheduled. I also discussed postoperative recovery.

## 2013-05-22 ENCOUNTER — Encounter (HOSPITAL_COMMUNITY): Payer: BC Managed Care – PPO | Admitting: Certified Registered"

## 2013-05-22 ENCOUNTER — Encounter (HOSPITAL_COMMUNITY): Payer: Self-pay | Admitting: *Deleted

## 2013-05-22 ENCOUNTER — Ambulatory Visit (HOSPITAL_COMMUNITY): Payer: BC Managed Care – PPO | Admitting: Certified Registered"

## 2013-05-22 ENCOUNTER — Encounter (HOSPITAL_COMMUNITY)
Admission: RE | Disposition: A | Discharge status: Home / Self Care | Payer: Self-pay | Source: Ambulatory Visit | Attending: Surgery

## 2013-05-22 ENCOUNTER — Ambulatory Visit (HOSPITAL_COMMUNITY)
Admission: RE | Admit: 2013-05-22 | Discharge: 2013-05-22 | Disposition: A | Discharge status: Home / Self Care | Payer: BC Managed Care – PPO | Source: Ambulatory Visit | Attending: Surgery | Admitting: Surgery

## 2013-05-22 DIAGNOSIS — K828 Other specified diseases of gallbladder: Secondary | ICD-10-CM | POA: Insufficient documentation

## 2013-05-22 DIAGNOSIS — K811 Chronic cholecystitis: Secondary | ICD-10-CM | POA: Insufficient documentation

## 2013-05-22 DIAGNOSIS — K7689 Other specified diseases of liver: Secondary | ICD-10-CM | POA: Insufficient documentation

## 2013-05-22 DIAGNOSIS — K7460 Unspecified cirrhosis of liver: Secondary | ICD-10-CM

## 2013-05-22 HISTORY — PX: CHOLECYSTECTOMY: SHX55

## 2013-05-22 SURGERY — LAPAROSCOPIC CHOLECYSTECTOMY
Anesthesia: General

## 2013-05-22 MED ORDER — DEXAMETHASONE SODIUM PHOSPHATE 10 MG/ML IJ SOLN
INTRAMUSCULAR | Status: DC | PRN
  Administered 2013-05-22: 8 mg via INTRAVENOUS

## 2013-05-22 MED ORDER — ONDANSETRON HCL 4 MG/2ML IJ SOLN
INTRAMUSCULAR | Status: DC | PRN
  Administered 2013-05-22: 4 mg via INTRAVENOUS

## 2013-05-22 MED ORDER — ACETAMINOPHEN 650 MG RE SUPP
650.0000 mg | RECTAL | Status: DC | PRN
  Filled 2013-05-22: qty 1

## 2013-05-22 MED ORDER — SODIUM CHLORIDE 0.9 % IR SOLN
Status: DC | PRN
  Administered 2013-05-22: 1000 mL

## 2013-05-22 MED ORDER — ROCURONIUM BROMIDE 100 MG/10ML IV SOLN
INTRAVENOUS | Status: DC | PRN
  Administered 2013-05-22: 25 mg via INTRAVENOUS

## 2013-05-22 MED ORDER — ONDANSETRON HCL 4 MG/2ML IJ SOLN
INTRAMUSCULAR | Status: AC
  Filled 2013-05-22: qty 2

## 2013-05-22 MED ORDER — ONDANSETRON HCL 4 MG/2ML IJ SOLN
4.0000 mg | Freq: Four times a day (QID) | INTRAMUSCULAR | Status: DC | PRN
  Administered 2013-05-22: 4 mg via INTRAVENOUS
  Filled 2013-05-22: qty 2

## 2013-05-22 MED ORDER — MORPHINE SULFATE 4 MG/ML IJ SOLN: 4.0000 mg | INTRAMUSCULAR | Status: DC | PRN

## 2013-05-22 MED ORDER — OXYCODONE HCL 5 MG PO TABS
5.0000 mg | ORAL_TABLET | Freq: Once | ORAL | Status: AC | PRN
  Administered 2013-05-22: 5 mg via ORAL
  Filled 2013-05-22: qty 1

## 2013-05-22 MED ORDER — OXYCODONE HCL 5 MG PO TABS: 5.0000 mg | ORAL_TABLET | ORAL | Status: DC | PRN

## 2013-05-22 MED ORDER — HYDROMORPHONE HCL PF 1 MG/ML IJ SOLN
0.2500 mg | INTRAMUSCULAR | Status: DC | PRN
  Administered 2013-05-22: 11:00:00 via INTRAVENOUS
  Administered 2013-05-22: 0.5 mg via INTRAVENOUS

## 2013-05-22 MED ORDER — HYDROMORPHONE HCL PF 1 MG/ML IJ SOLN
INTRAMUSCULAR | Status: AC
  Filled 2013-05-22: qty 1

## 2013-05-22 MED ORDER — HYDROCODONE-ACETAMINOPHEN 5-325 MG PO TABS: 1.0000 | ORAL_TABLET | ORAL | Status: DC | PRN

## 2013-05-22 MED ORDER — SCOPOLAMINE 1 MG/3DAYS TD PT72
1.0000 | MEDICATED_PATCH | TRANSDERMAL | Status: DC
  Administered 2013-05-22: 1 via TRANSDERMAL

## 2013-05-22 MED ORDER — NEOSTIGMINE METHYLSULFATE 1 MG/ML IJ SOLN
INTRAMUSCULAR | Status: DC | PRN
  Administered 2013-05-22: 3 mg via INTRAVENOUS

## 2013-05-22 MED ORDER — LIDOCAINE HCL (CARDIAC) 20 MG/ML IV SOLN
INTRAVENOUS | Status: DC | PRN
  Administered 2013-05-22: 40 mg via INTRAVENOUS

## 2013-05-22 MED ORDER — MIDAZOLAM HCL 5 MG/5ML IJ SOLN
INTRAMUSCULAR | Status: DC | PRN
  Administered 2013-05-22: 2 mg via INTRAVENOUS

## 2013-05-22 MED ORDER — SODIUM CHLORIDE 0.9 % IV SOLN: 250.0000 mL | INTRAVENOUS | Status: DC | PRN

## 2013-05-22 MED ORDER — KETOROLAC TROMETHAMINE 30 MG/ML IJ SOLN
INTRAMUSCULAR | Status: DC | PRN
  Administered 2013-05-22: 30 mg via INTRAVENOUS

## 2013-05-22 MED ORDER — BUPIVACAINE-EPINEPHRINE PF 0.25-1:200000 % IJ SOLN
INTRAMUSCULAR | Status: AC
  Filled 2013-05-22: qty 30

## 2013-05-22 MED ORDER — PROMETHAZINE HCL 25 MG/ML IJ SOLN
6.2500 mg | Freq: Once | INTRAMUSCULAR | Status: DC
  Filled 2013-05-22: qty 1

## 2013-05-22 MED ORDER — SODIUM CHLORIDE 0.9 % IJ SOLN: 3.0000 mL | INTRAMUSCULAR | Status: DC | PRN

## 2013-05-22 MED ORDER — PROPOFOL 10 MG/ML IV BOLUS
INTRAVENOUS | Status: DC | PRN
  Administered 2013-05-22: 100 mg via INTRAVENOUS
  Administered 2013-05-22: 30 mg via INTRAVENOUS

## 2013-05-22 MED ORDER — SODIUM CHLORIDE 0.9 % IJ SOLN: 3.0000 mL | Freq: Two times a day (BID) | INTRAMUSCULAR | Status: DC

## 2013-05-22 MED ORDER — ACETAMINOPHEN 325 MG PO TABS
650.0000 mg | ORAL_TABLET | ORAL | Status: DC | PRN
  Filled 2013-05-22: qty 2

## 2013-05-22 MED ORDER — OXYCODONE HCL 5 MG/5ML PO SOLN: 5.0000 mg | Freq: Once | ORAL | Status: AC | PRN

## 2013-05-22 MED ORDER — LACTATED RINGERS IV SOLN
INTRAVENOUS | Status: DC
  Administered 2013-05-22: 08:00:00 via INTRAVENOUS

## 2013-05-22 MED ORDER — GLYCOPYRROLATE 0.2 MG/ML IJ SOLN
INTRAMUSCULAR | Status: DC | PRN
  Administered 2013-05-22: 0.4 mg via INTRAVENOUS

## 2013-05-22 MED ORDER — FENTANYL CITRATE 0.05 MG/ML IJ SOLN
INTRAMUSCULAR | Status: DC | PRN
  Administered 2013-05-22: 100 ug via INTRAVENOUS

## 2013-05-22 MED ORDER — BUPIVACAINE-EPINEPHRINE 0.25% -1:200000 IJ SOLN
INTRAMUSCULAR | Status: DC | PRN
  Administered 2013-05-22: 20 mL

## 2013-05-22 SURGICAL SUPPLY — 46 items
APL SKNCLS STERI-STRIP NONHPOA (GAUZE/BANDAGES/DRESSINGS) ×1
APPLIER CLIP 5 13 M/L LIGAMAX5 (MISCELLANEOUS) ×2
APR CLP MED LRG 5 ANG JAW (MISCELLANEOUS) ×1
BAG SPEC RTRVL LRG 6X4 10 (ENDOMECHANICALS) ×1
BANDAGE ADHESIVE 1X3 (GAUZE/BANDAGES/DRESSINGS) ×7 IMPLANT
BENZOIN TINCTURE PRP APPL 2/3 (GAUZE/BANDAGES/DRESSINGS) ×2 IMPLANT
CANISTER SUCTION 2500CC (MISCELLANEOUS) ×2 IMPLANT
CHLORAPREP W/TINT 26ML (MISCELLANEOUS) ×2 IMPLANT
CLIP APPLIE 5 13 M/L LIGAMAX5 (MISCELLANEOUS) ×1 IMPLANT
CLSR STERI-STRIP ANTIMIC 1/2X4 (GAUZE/BANDAGES/DRESSINGS) ×4 IMPLANT
CONT SPECI 4OZ STER CLIK (MISCELLANEOUS) ×1 IMPLANT
COVER MAYO STAND STRL (DRAPES) IMPLANT
COVER SURGICAL LIGHT HANDLE (MISCELLANEOUS) ×2 IMPLANT
DECANTER SPIKE VIAL GLASS SM (MISCELLANEOUS) ×2 IMPLANT
DRAPE C-ARM 42X72 X-RAY (DRAPES) IMPLANT
DRSG TEGADERM 4X4.75 (GAUZE/BANDAGES/DRESSINGS) ×1 IMPLANT
ELECT REM PT RETURN 9FT ADLT (ELECTROSURGICAL) ×2
ELECTRODE REM PT RTRN 9FT ADLT (ELECTROSURGICAL) ×1 IMPLANT
GAUZE SPONGE 2X2 8PLY STRL LF (GAUZE/BANDAGES/DRESSINGS) IMPLANT
GLOVE BIOGEL PI IND STRL 6.5 (GLOVE) IMPLANT
GLOVE BIOGEL PI IND STRL 7.0 (GLOVE) IMPLANT
GLOVE BIOGEL PI INDICATOR 6.5 (GLOVE) ×2
GLOVE BIOGEL PI INDICATOR 7.0 (GLOVE) ×1
GLOVE SS BIOGEL STRL SZ 6.5 (GLOVE) IMPLANT
GLOVE SUPERSENSE BIOGEL SZ 6.5 (GLOVE) ×2
GLOVE SURG SIGNA 7.5 PF LTX (GLOVE) ×2 IMPLANT
GOWN STRL NON-REIN LRG LVL3 (GOWN DISPOSABLE) ×5 IMPLANT
GOWN STRL REIN XL XLG (GOWN DISPOSABLE) ×2 IMPLANT
KIT BASIN OR (CUSTOM PROCEDURE TRAY) ×2 IMPLANT
KIT ROOM TURNOVER OR (KITS) ×2 IMPLANT
NS IRRIG 1000ML POUR BTL (IV SOLUTION) ×2 IMPLANT
PAD ARMBOARD 7.5X6 YLW CONV (MISCELLANEOUS) ×2 IMPLANT
POUCH SPECIMEN RETRIEVAL 10MM (ENDOMECHANICALS) ×1 IMPLANT
SCISSORS LAP 5X35 DISP (ENDOMECHANICALS) ×2 IMPLANT
SET CHOLANGIOGRAPH 5 50 .035 (SET/KITS/TRAYS/PACK) IMPLANT
SET IRRIG TUBING LAPAROSCOPIC (IRRIGATION / IRRIGATOR) ×2 IMPLANT
SLEEVE ENDOPATH XCEL 5M (ENDOMECHANICALS) ×4 IMPLANT
SPECIMEN JAR SMALL (MISCELLANEOUS) ×2 IMPLANT
SPONGE GAUZE 2X2 STER 10/PKG (GAUZE/BANDAGES/DRESSINGS) ×1
SUT MON AB 4-0 PC3 18 (SUTURE) ×2 IMPLANT
TOWEL OR 17X24 6PK STRL BLUE (TOWEL DISPOSABLE) ×2 IMPLANT
TOWEL OR 17X26 10 PK STRL BLUE (TOWEL DISPOSABLE) ×2 IMPLANT
TRAY LAPAROSCOPIC (CUSTOM PROCEDURE TRAY) ×2 IMPLANT
TROCAR XCEL BLUNT TIP 100MML (ENDOMECHANICALS) ×2 IMPLANT
TROCAR XCEL NON-BLD 5MMX100MML (ENDOMECHANICALS) ×2 IMPLANT
WATER STERILE IRR 1000ML POUR (IV SOLUTION) IMPLANT

## 2013-05-22 NOTE — Progress Notes (Signed)
Dr fitzgerald and dr Magnus Ivan at bedside ok to go to c12 pt stable no nausea at present additional orders obtained if pt needs additional nausea medicine

## 2013-05-22 NOTE — Anesthesia Preprocedure Evaluation (Addendum)
Anesthesia Evaluation  Patient identified by MRN, date of birth, ID band Patient awake    Reviewed: Allergy & Precautions, H&P , NPO status , Patient's Chart, lab work & pertinent test results  History of Anesthesia Complications (+) PONV and history of anesthetic complications  Airway Mallampati: II TM Distance: >3 FB Neck ROM: Full    Dental no notable dental hx. (+) Teeth Intact and Dental Advisory Given   Pulmonary neg pulmonary ROS,  breath sounds clear to auscultation  Pulmonary exam normal       Cardiovascular negative cardio ROS  + dysrhythmias Rhythm:Regular Rate:Normal     Neuro/Psych  Headaches, negative psych ROS   GI/Hepatic negative GI ROS, Neg liver ROS,   Endo/Other  Hypothyroidism   Renal/GU negative Renal ROS  negative genitourinary   Musculoskeletal   Abdominal   Peds  Hematology negative hematology ROS (+)   Anesthesia Other Findings   Reproductive/Obstetrics negative OB ROS                           Anesthesia Physical Anesthesia Plan  ASA: II  Anesthesia Plan: General   Post-op Pain Management:    Induction: Intravenous  Airway Management Planned: Oral ETT  Additional Equipment:   Intra-op Plan:   Post-operative Plan: Extubation in OR  Informed Consent: I have reviewed the patients History and Physical, chart, labs and discussed the procedure including the risks, benefits and alternatives for the proposed anesthesia with the patient or authorized representative who has indicated his/her understanding and acceptance.   Dental advisory given  Plan Discussed with: CRNA and Surgeon  Anesthesia Plan Comments:         Anesthesia Quick Evaluation

## 2013-05-22 NOTE — Anesthesia Postprocedure Evaluation (Signed)
  Anesthesia Post-op Note  Patient: Sydney Bailey  Procedure(s) Performed: Procedure(s): LAPAROSCOPIC CHOLECYSTECTOMY (N/A)  Patient Location: PACU  Anesthesia Type:General  Level of Consciousness: awake and alert   Airway and Oxygen Therapy: Patient Spontanous Breathing  Post-op Pain: mild  Post-op Assessment: Post-op Vital signs reviewed, Patient's Cardiovascular Status Stable and Respiratory Function Stable  Post-op Vital Signs: Reviewed  Filed Vitals:   05/22/13 0945  BP: 124/72  Pulse: 58  Temp:   Resp:     Complications: No apparent anesthesia complications

## 2013-05-22 NOTE — Op Note (Signed)
Laparoscopic Cholecystectomy Procedure Note, Biopsy right lobe liver nodule  Indications: This patient presents with symptomatic gallbladder disease and will undergo laparoscopic cholecystectomy.  Pre-operative Diagnosis: biliary dyskinesia  Post-operative Diagnosis: biliary dyskinesia, nodule right lobe of liver  Surgeon: Abigail Miyamoto A   Assistants: 0  Anesthesia: General endotracheal anesthesia  ASA Class: 2  Procedure Details  The patient was seen again in the Holding Room. The risks, benefits, complications, treatment options, and expected outcomes were discussed with the patient. The possibilities of reaction to medication, pulmonary aspiration, perforation of viscus, bleeding, recurrent infection, finding a normal gallbladder, the need for additional procedures, failure to diagnose a condition, the possible need to convert to an open procedure, and creating a complication requiring transfusion or operation were discussed with the patient. The likelihood of improving the patient's symptoms with return to their baseline status is good.  The patient and/or family concurred with the proposed plan, giving informed consent. The site of surgery properly noted. The patient was taken to Operating Room, identified as Sydney Bailey and the procedure verified as Laparoscopic Cholecystectomy with Intraoperative Cholangiogram. A Time Out was held and the above information confirmed.  Prior to the induction of general anesthesia, antibiotic prophylaxis was administered. General endotracheal anesthesia was then administered and tolerated well. After the induction, the abdomen was prepped with Chloraprep and draped in sterile fashion. The patient was positioned in the supine position.  Local anesthetic agent was injected into the skin near the umbilicus and an incision made. We dissected down to the abdominal fascia with blunt dissection.  The fascia was incised vertically and we entered the  peritoneal cavity bluntly.  A pursestring suture of 0-Vicryl was placed around the fascial opening.  The Hasson cannula was inserted and secured with the stay suture.  Pneumoperitoneum was then created with CO2 and tolerated well without any adverse changes in the patient's vital signs. An 11-mm port was placed in the subxiphoid position.  Two 5-mm ports were placed in the right upper quadrant. All skin incisions were infiltrated with a local anesthetic agent before making the incision and placing the trocars.   We positioned the patient in reverse Trendelenburg, tilted slightly to the patient's left.  There was a 1cm, firm yellow nodule lateral to the gallbladder on the right lobe of the liver.  I excised the nodule with the cautery and sent the nodule to pathology for evaluation.  Hemostasis was achieved at the biopsy site.  No other abnormalities were seen in the liver. The gallbladder was identified, the fundus grasped and retracted cephalad. Adhesions were lysed bluntly and with the electrocautery where indicated, taking care not to injure any adjacent organs or viscus. The infundibulum was grasped and retracted laterally, exposing the peritoneum overlying the triangle of Calot. This was then divided and exposed in a blunt fashion. The cystic duct was clearly identified and bluntly dissected circumferentially. A critical view of the cystic duct and cystic artery was obtained.  The cystic duct was then ligated with clips and divided. The cystic artery was, dissected free, ligated with clips and divided as well.   The gallbladder was dissected from the liver bed in retrograde fashion with the electrocautery. The gallbladder was removed and placed in an Endocatch sac. The liver bed was irrigated and inspected. Hemostasis was achieved with the electrocautery. Copious irrigation was utilized and was repeatedly aspirated until clear.  The gallbladder and Endocatch sac were then removed through the umbilical port  site.  The pursestring suture was used  to close the umbilical fascia.    We again inspected the right upper quadrant for hemostasis.  Pneumoperitoneum was released as we removed the trocars.  4-0 Monocryl was used to close the skin.   Benzoin, steri-strips, and clean dressings were applied. The patient was then extubated and brought to the recovery room in stable condition. Instrument, sponge, and needle counts were correct at closure and at the conclusion of the case.   Findings: Cholecystitis without Cholelithiasis 1 cm nodule on right liver lobe lateral to the gallbladder  Estimated Blood Loss: Minimal         Drains: 0         Specimens: Gallbladder           Complications: None; patient tolerated the procedure well.         Disposition: PACU - hemodynamically stable.         Condition: stable

## 2013-05-22 NOTE — Anesthesia Procedure Notes (Signed)
Procedure Name: Intubation Date/Time: 05/22/2013 8:41 AM Performed by: Arlice Colt B Pre-anesthesia Checklist: Patient identified, Emergency Drugs available, Suction available, Patient being monitored and Timeout performed Patient Re-evaluated:Patient Re-evaluated prior to inductionOxygen Delivery Method: Circle system utilized Preoxygenation: Pre-oxygenation with 100% oxygen Intubation Type: IV induction Ventilation: Mask ventilation without difficulty Laryngoscope Size: Mac and 3 Grade View: Grade I Tube type: Oral Tube size: 7.0 mm Airway Equipment and Method: Stylet Placement Confirmation: ETT inserted through vocal cords under direct vision,  positive ETCO2 and breath sounds checked- equal and bilateral Secured at: 21 cm Tube secured with: Tape Dental Injury: Teeth and Oropharynx as per pre-operative assessment

## 2013-05-22 NOTE — Transfer of Care (Signed)
Immediate Anesthesia Transfer of Care Note  Patient: Sydney Bailey  Procedure(s) Performed: Procedure(s): LAPAROSCOPIC CHOLECYSTECTOMY (N/A)  Patient Location: PACU  Anesthesia Type:General  Level of Consciousness: awake, alert  and oriented  Airway & Oxygen Therapy: Patient Spontanous Breathing and Patient connected to nasal cannula oxygen  Post-op Assessment: Report given to PACU RN and Post -op Vital signs reviewed and stable  Post vital signs: Reviewed and stable  Complications: No apparent anesthesia complications

## 2013-05-22 NOTE — Interval H&P Note (Signed)
History and Physical Interval Note: no change in H and P  05/22/2013 7:04 AM  Sydney Bailey Dwain Sarna  has presented today for surgery, with the diagnosis of biliary dykinesia   The various methods of treatment have been discussed with the patient and family. After consideration of risks, benefits and other options for treatment, the patient has consented to  Procedure(s): LAPAROSCOPIC CHOLECYSTECTOMY (N/A) as a surgical intervention .  The patient's history has been reviewed, patient examined, no change in status, stable for surgery.  I have reviewed the patient's chart and labs.  Questions were answered to the patient's satisfaction.     Janisha Bueso A

## 2013-05-27 ENCOUNTER — Encounter (HOSPITAL_COMMUNITY): Payer: Self-pay | Admitting: Surgery

## 2013-06-02 ENCOUNTER — Other Ambulatory Visit: Payer: Self-pay | Admitting: Family Medicine

## 2013-06-10 ENCOUNTER — Encounter: Payer: Self-pay | Admitting: Sports Medicine

## 2013-06-10 ENCOUNTER — Ambulatory Visit (INDEPENDENT_AMBULATORY_CARE_PROVIDER_SITE_OTHER): Payer: BC Managed Care – PPO | Admitting: Sports Medicine

## 2013-06-10 ENCOUNTER — Telehealth: Payer: Self-pay | Admitting: Family Medicine

## 2013-06-10 ENCOUNTER — Encounter (INDEPENDENT_AMBULATORY_CARE_PROVIDER_SITE_OTHER): Payer: Self-pay | Admitting: Surgery

## 2013-06-10 ENCOUNTER — Ambulatory Visit (INDEPENDENT_AMBULATORY_CARE_PROVIDER_SITE_OTHER): Payer: BC Managed Care – PPO | Admitting: Surgery

## 2013-06-10 VITALS — BP 126/80 | HR 72 | Temp 97.5°F | Resp 16 | Ht 66.5 in | Wt 146.2 lb

## 2013-06-10 VITALS — BP 124/66 | HR 87 | Wt 147.0 lb

## 2013-06-10 DIAGNOSIS — F410 Panic disorder [episodic paroxysmal anxiety] without agoraphobia: Secondary | ICD-10-CM

## 2013-06-10 DIAGNOSIS — Z09 Encounter for follow-up examination after completed treatment for conditions other than malignant neoplasm: Secondary | ICD-10-CM

## 2013-06-10 DIAGNOSIS — R002 Palpitations: Secondary | ICD-10-CM

## 2013-06-10 DIAGNOSIS — G43009 Migraine without aura, not intractable, without status migrainosus: Secondary | ICD-10-CM

## 2013-06-10 DIAGNOSIS — E039 Hypothyroidism, unspecified: Secondary | ICD-10-CM

## 2013-06-10 MED ORDER — CITALOPRAM HYDROBROMIDE 40 MG PO TABS
40.0000 mg | ORAL_TABLET | Freq: Every day | ORAL | Status: DC
Start: 2013-06-10 — End: 2014-01-03

## 2013-06-10 MED ORDER — AMBULATORY NON FORMULARY MEDICATION: Status: DC

## 2013-06-10 MED ORDER — RIZATRIPTAN BENZOATE 10 MG PO TBDP: 10.0000 mg | ORAL_TABLET | ORAL | Status: DC | PRN

## 2013-06-10 MED ORDER — NORETHIN ACE-ETH ESTRAD-FE 1-20 MG-MCG PO TABS
1.0000 | ORAL_TABLET | Freq: Every day | ORAL | Status: DC
Start: 2013-06-10 — End: 2016-04-21

## 2013-06-10 MED ORDER — ALPRAZOLAM 0.5 MG PO TABS: 0.2500 mg | ORAL_TABLET | Freq: Three times a day (TID) | ORAL | Status: DC | PRN

## 2013-06-10 NOTE — Assessment & Plan Note (Signed)
Refilling citalopram and a  small number of Xanax.

## 2013-06-10 NOTE — Progress Notes (Signed)
  Subjective:    CC: Unable to see PCP, discuss multiple issues  HPI: Hypothyroidism: Stable, feels good, TSHs have been running normal, needs refills on her medicines.  Anxiety: Needs refills on her medications, symptoms are stable.  Palpitations: Saw cardiology, diagnosed with PVCs, has had a negative echo, has been resistant to beta blocker treatment, and symptoms are tolerable.  Abdominal discomfort: Improved significantly after cholecystectomy.  Migraines: Well controlled currently with Maxalt.  Past medical history, Surgical history, Family history not pertinant except as noted below, Social history, Allergies, and medications have been entered into the medical record, reviewed, and no changes needed.   Review of Systems: No fevers, chills, night sweats, weight loss, chest pain, or shortness of breath.   Objective:    General: Well Developed, well nourished, and in no acute distress.  Neuro: Alert and oriented x3, extra-ocular muscles intact, sensation grossly intact.  HEENT: Normocephalic, atraumatic, pupils equal round reactive to light, neck supple, no masses, no lymphadenopathy, thyroid nonpalpable.  Skin: Warm and dry, no rashes. Cardiac: Regular rate and rhythm, no murmurs rubs or gallops, no lower extremity edema.  Respiratory: Clear to auscultation bilaterally. Not using accessory muscles, speaking in full sentences.  Impression and Recommendations:

## 2013-06-10 NOTE — Assessment & Plan Note (Signed)
TSH has been running normal. Refilling medication.

## 2013-06-10 NOTE — Progress Notes (Signed)
Subjective:     Patient ID: Sydney Bailey, female   DOB: [birth year], 48 y.o.   MRN: [id number]  HPI She is here for her first postop visit status post laparoscopic cholecystectomy. She is doing well and is without complaints.  Review of Systems     Objective:   Physical Exam On exam, her incisions are healing well. The final pathology showed chronic cholecystitis. The biopsy of the liver was benign    Assessment:     Patient stable postop     Plan:     She may resume her normal activity. I will see her back as needed

## 2013-06-10 NOTE — Assessment & Plan Note (Addendum)
Well-controlled, refilling Maxalt. Certainly if these become more common, treatment with a nonselective beta blocker would likely help her PVCs and her migraines.

## 2013-06-10 NOTE — Telephone Encounter (Signed)
Pt wants to switch from Dr. Linford ArnoldMetheney to Dr Ivan AnchorsHommel.

## 2013-06-10 NOTE — Telephone Encounter (Signed)
That is fine. I last saw her a year ago and I know Dr. Ivan AnchorsHommel has taken care of her severeal times.

## 2013-06-10 NOTE — Telephone Encounter (Signed)
Elease HashimotoPatricia, Since I've never taken care of any of her chronic conditions I need her to follow up with me within the next month to establish care and go over her chronic conditions.  Can you please relay this to the patient.

## 2013-06-10 NOTE — Assessment & Plan Note (Signed)
Secondary to PVCs and a structurally normal heart. She does not desire any beta blocker treatment, I think this is appropriate.

## 2013-06-17 ENCOUNTER — Telehealth: Payer: Self-pay

## 2013-06-17 MED ORDER — AMBULATORY NON FORMULARY MEDICATION: Status: DC

## 2013-06-17 NOTE — Telephone Encounter (Signed)
refill 

## 2013-06-30 ENCOUNTER — Encounter (INDEPENDENT_AMBULATORY_CARE_PROVIDER_SITE_OTHER): Payer: BC Managed Care – PPO | Admitting: Surgery

## 2013-07-08 ENCOUNTER — Encounter: Payer: Self-pay | Admitting: Family Medicine

## 2013-07-08 ENCOUNTER — Ambulatory Visit (INDEPENDENT_AMBULATORY_CARE_PROVIDER_SITE_OTHER): Payer: BC Managed Care – PPO | Admitting: Family Medicine

## 2013-07-08 VITALS — BP 116/70 | HR 68 | Wt 147.0 lb

## 2013-07-08 DIAGNOSIS — F410 Panic disorder [episodic paroxysmal anxiety] without agoraphobia: Secondary | ICD-10-CM

## 2013-07-08 DIAGNOSIS — H6691 Otitis media, unspecified, right ear: Secondary | ICD-10-CM

## 2013-07-08 DIAGNOSIS — G43009 Migraine without aura, not intractable, without status migrainosus: Secondary | ICD-10-CM

## 2013-07-08 DIAGNOSIS — H6690 Otitis media, unspecified, unspecified ear: Secondary | ICD-10-CM

## 2013-07-08 DIAGNOSIS — F3289 Other specified depressive episodes: Secondary | ICD-10-CM

## 2013-07-08 DIAGNOSIS — F329 Major depressive disorder, single episode, unspecified: Secondary | ICD-10-CM

## 2013-07-08 DIAGNOSIS — E039 Hypothyroidism, unspecified: Secondary | ICD-10-CM

## 2013-07-08 DIAGNOSIS — R1013 Epigastric pain: Secondary | ICD-10-CM

## 2013-07-08 DIAGNOSIS — R002 Palpitations: Secondary | ICD-10-CM

## 2013-07-08 MED ORDER — NEOMYCIN-POLYMYXIN-HC 1 % OT SOLN: OTIC | Status: AC

## 2013-07-08 NOTE — Progress Notes (Signed)
CC: Sydney Bailey is a 48 y.o. female is here for discuss medications and rt ear pain   Subjective: HPI:  Patient presents with desire to switch PCP to myself  When I saw her last this was back in the summer she was complaining of epigastric pain since I saw her she has had an upper endoscopy which was unremarkable eventually had cholecystectomy which showed mild chronic cholecystitis she states that since this surgery she no longer has any more epigastric pain that was present prior to her surgery.  She occasionally gets what she describes as hunger pains but this lasts only seconds and is rare. There has been no nausea, vomiting, nor dietary issues. There has been no unintentional weight loss or gain    she reports a history of migraine that is now occurring one to 2 times a month. It always has a predictable and good response to Maxalt.  She has tried Topamax which caused intolerable anxiety and side effects she is not interested in taking any preventative medications.  She denies any recent or remote change in the character severity or frequency of her migraines other than when she originally was seen Dr. Cathey Bailey years ago she was having up to 5 migraines a week.  She believes that symptoms have been significantly decreased ever since starting norethindrone/ethinyl estradiol and progesterone.  She reports a history of depression with panic attacks. She is satisfied with with her current regimen of citalopram and rare use of Xanax. She states anxiety and depression are currently nonexistent and well controlled no other mental disturbance.  Reports a history of hyperthyroidism that was first noticed after the birth of one of her children. There has been no change to her compounded non-porcine thyroid regimen for greater than one year. She denies any diarrhea, constipation, tremor, nor skin changes.  She complains of right ear pain has been present for one week and a half. No interventions as of  yet. Described as pain mild in severity worse when inserting anything in the ear. Has not been accompanied with nasal congestion, fevers, cough, sore throat, nor hearing loss or dizziness.  Since I saw her last she is been evaluated for palpitations. She states that she feels these occasionally throughout the day however they do not cause her any discomfort or shortness of breath or chest pain. She's had a normal echocardiogram and a normal EKG when she was evaluated by cardiology. She states that she does not want to take any medication to prevent palpitations.   Review Of Systems Outlined In HPI  Past Medical History  Diagnosis Date  . Hypothyroidism 11-08    started post- partum  . Allergy   . Anxiety   . Depression   . Migraines   . PVC's (premature ventricular contractions)     Pt saw Dr. Jens Somrenshaw for this but pt stated it was fine  . PONV (postoperative nausea and vomiting)     Difficult waking up; had bad migraine after bunionectomy  . Family history of anesthesia complication     Father had difficult time waking up   . Heart palpitations     PVC's  . Shortness of breath     when having PVC's     Family History  Problem Relation Age of Onset  . Heart disease Father     Atrial fibrillation  . Cancer Maternal Aunt     breast  . Cancer Paternal Aunt     non hodgkins lymphoma  . Cancer  Maternal Grandmother     breast     History  Substance Use Topics  . Smoking status: Never Smoker   . Smokeless tobacco: Never Used  . Alcohol Use: No     Objective: Filed Vitals:   07/08/13 0837  BP: 116/70  Pulse: 68    General: Alert and Oriented, No Acute Distress HEENT: Pupils equal, round, reactive to light. Conjunctivae clear.  External ears unremarkable,  left canal is clear with intact tympanic membrane, right canal has mild edema with scant cerumen tympanic membrane is intact with normal landmarks .  Middle ear appears open without effusion. Pink inferior turbinates.   Moist mucous membranes, pharynx without inflammation nor lesions.  Neck supple without palpable lymphadenopathy nor abnormal masses. Lungs:  clear comfortable work of breathing  Cardiac: Regular rate and rhythm.  Extremities: No peripheral edema.  Strong peripheral pulses.  Mental Status: No depression, anxiety, nor agitation. Skin: Warm and dry.  Assessment & Plan: [name] was seen today for discuss medications and rt ear pain.  Diagnoses and associated orders for this visit:  COMMON MIGRAINE  DEPRESSION  PANIC ATTACK  Unspecified hypothyroidism  Right otitis media - NEOMYCIN-POLYMYXIN-HYDROCORTISONE (CORTISPORIN) 1 % SOLN otic solution; Four drops in right ear three times a day, keep in ear(s) for five minutes. Total of ten days.  Abdominal pain, epigastric  Palpitations    Common migraine: Controlled continue Maxalt as needed   depression with panic attacks: Controlled continue current regimen of citalopram and as needed sparing use of Xanax Hypothyroidism: Clinically control not due for repeat TSH until around April, she can call in for lab only visit if she would like Abdominal pain: Resolved after cholecystectomy no further intervention however offered bile acid sequestrants if she ever develops diarrhea in the near future Right otitis media: Start Cortisporin Palpitations: Offered beta blocker if sensation of palpations in the way of quality of life however she prefer not to take any medication at this time, controlled  40 minutes spent face-to-face during visit today of which at least 50% was counseling or coordinating care regarding: 1. COMMON MIGRAINE   2. DEPRESSION   3. PANIC ATTACK   4. Unspecified hypothyroidism   5. Right otitis media   6. Abdominal pain, epigastric   7. Palpitations      Return in about 6 months (around 01/05/2014).

## 2013-11-24 ENCOUNTER — Encounter: Payer: Self-pay | Admitting: Family Medicine

## 2013-11-24 ENCOUNTER — Ambulatory Visit (INDEPENDENT_AMBULATORY_CARE_PROVIDER_SITE_OTHER): Payer: BC Managed Care – PPO | Admitting: Family Medicine

## 2013-11-24 VITALS — BP 103/64 | HR 54 | Temp 98.0°F | Wt 145.0 lb

## 2013-11-24 DIAGNOSIS — E039 Hypothyroidism, unspecified: Secondary | ICD-10-CM

## 2013-11-24 DIAGNOSIS — R5383 Other fatigue: Secondary | ICD-10-CM

## 2013-11-24 DIAGNOSIS — R5381 Other malaise: Secondary | ICD-10-CM

## 2013-11-24 DIAGNOSIS — R197 Diarrhea, unspecified: Secondary | ICD-10-CM

## 2013-11-24 MED ORDER — CHOLESTYRAMINE 4 G PO PACK: 4.0000 g | PACK | Freq: Three times a day (TID) | ORAL | Status: DC

## 2013-11-24 NOTE — Progress Notes (Signed)
CC: Sydney BlinksKenda M Bailey is a 48 y.o. female is here for Fatigue and Abdominal Pain   Subjective: HPI:  Complains of diarrhea described as loose stool without blood or melena appearance. It will come and go on a weekly basis ever since her gallbladder was removed about 6 months ago. When it occurs it is described as severe with very little warning before she has to defecate. It is worsened with fatty foods or veering away from a bland diet.  Rice is the only thing she can tolerate without predictable diarrhea. Is accompanied by increased bowel sounds and a bloating sensation all of which is relieved with bowel movement. No interventions as of yet other than avoiding certain foods. Symptoms do not awake her at night, she denies unintentional weight loss or gain. Denies vomiting, nausea, nor any other gastrointestinal complaints.  Over the past month she's noticed a moderate degree of fatigue that is present throughout the day described as lack of energy. It has also been accompanied by mild muscle aches, mild degree of hair falling out, and occasional subjective fevers and chills but no objective fever when she checks her temperature. There's been no change her medication regimen preceding the symptoms.  Review Of Systems Outlined In HPI  Past Medical History  Diagnosis Date  . Hypothyroidism 11-08    started post- partum  . Allergy   . Anxiety   . Depression   . Migraines   . PVC's (premature ventricular contractions)     Pt saw Dr. Jens Somrenshaw for this but pt stated it was fine  . PONV (postoperative nausea and vomiting)     Difficult waking up; had bad migraine after bunionectomy  . Family history of anesthesia complication     Father had difficult time waking up   . Heart palpitations     PVC's  . Shortness of breath     when having PVC's    Past Surgical History  Procedure Laterality Date  . Bunionectomy Bilateral     x 2 on both feet  . Essure btl    . Upper gi endoscopy    .  Cholecystectomy N/A 05/22/2013    Procedure: LAPAROSCOPIC CHOLECYSTECTOMY;  Surgeon: Shelly Rubensteinouglas A Blackman, MD;  Location: [facility] OR;  Service: General;  Laterality: N/A;   Family History  Problem Relation Age of Onset  . Heart disease Father     Atrial fibrillation  . Cancer Maternal Aunt     breast  . Cancer Paternal Aunt     non hodgkins lymphoma  . Cancer Maternal Grandmother     breast    History   Social History  . Marital Status: Married    Spouse Name: N/A    Number of Children: 3  . Years of Education: N/A   Occupational History  . Print production plannerffice Manager    Social History Main Topics  . Smoking status: Never Smoker   . Smokeless tobacco: Never Used  . Alcohol Use: No  . Drug Use: No  . Sexual Activity: Not on file   Other Topics Concern  . Not on file   Social History Narrative  . No narrative on file     Objective: BP 103/64  Pulse 54  Temp(Src) 98 F (36.7 C) (Oral)  Wt 145 lb (65.772 kg)  General: Alert and Oriented, No Acute Distress HEENT: Pupils equal, round, reactive to light. Conjunctivae clear.  Moist membranes pharynx unremarkable  Lungs: Clear comfortable work of breathing Cardiac: Regular rate and rhythm.  Abdomen: Flat and soft Extremities: No peripheral edema.  Strong peripheral pulses.  Mental Status: No depression, anxiety, nor agitation. Skin: Warm and dry.  Assessment & Plan: Sydney Bailey was seen today for fatigue and abdominal pain.  Diagnoses and associated orders for this visit:  Unspecified hypothyroidism - TSH - T4, free - T3, free  Other fatigue - CBC - TSH - T4, free - T3, free - Vit D  25 hydroxy (rtn osteoporosis monitoring) - B12  Diarrhea - cholestyramine (QUESTRAN) 4 G packet; Take 1 packet (4 g total) by mouth 3 (three) times daily with meals.    Diarrhea: Start by acid sequestrant, she has not been out of the country recently and symptoms do not begin around the time of taking antibiotics therefore am highly suspicious  that her diarrhea is purely due to bile acids following her cholecystectomy. Fatigue: Checking full thyroid panel, there is a suspicion that she's not absorbing her medication if she's having diarrhea as often as she describes. Checking for additional deficiencies, hopefully we can improve her diarrhea her secondary symptoms will improve.   Return if symptoms worsen or fail to improve.

## 2013-11-25 LAB — CBC
HEMATOCRIT: 41.7 % (ref 36.0–46.0)
HEMOGLOBIN: 14.1 g/dL (ref 12.0–15.0)
MCH: 31.7 pg (ref 26.0–34.0)
MCHC: 33.8 g/dL (ref 30.0–36.0)
MCV: 93.7 fL (ref 78.0–100.0)
Platelets: 276 10*3/uL (ref 150–400)
RBC: 4.45 MIL/uL (ref 3.87–5.11)
RDW: 13 % (ref 11.5–15.5)
WBC: 7.3 10*3/uL (ref 4.0–10.5)

## 2013-11-25 LAB — TSH: TSH: 1.002 u[IU]/mL (ref 0.350–4.500)

## 2013-11-25 LAB — VITAMIN D 25 HYDROXY (VIT D DEFICIENCY, FRACTURES): Vit D, 25-Hydroxy: 48 ng/mL (ref 30–89)

## 2013-11-25 LAB — T4, FREE: FREE T4: 0.89 ng/dL (ref 0.80–1.80)

## 2013-11-25 LAB — T3, FREE: T3, Free: 3.3 pg/mL (ref 2.3–4.2)

## 2013-11-25 LAB — VITAMIN B12: VITAMIN B 12: 397 pg/mL (ref 211–911)

## 2013-12-11 ENCOUNTER — Telehealth: Payer: Self-pay | Admitting: *Deleted

## 2013-12-11 NOTE — Telephone Encounter (Signed)
Pt states she is supposed to have f/u blood work for her thyroid. I did see that it was on the low end of nml but I didn't see that you wanted her to have it recheck, just for her to call back to let you know how another medication was working. Iam missing something but I dont think she needs f/u blood work yet.Marland Kitchen..Marland Kitchen

## 2013-12-12 MED ORDER — AMBULATORY NON FORMULARY MEDICATION: Status: DC

## 2013-12-12 NOTE — Telephone Encounter (Signed)
Pt's diarrhea has improved. Pt requests refill on progesterone and thyroid medication

## 2013-12-12 NOTE — Telephone Encounter (Signed)
I don't see any need for blood work, I was only requesting an update on whether or not her diarrhea improved after starting cholestyramine.

## 2014-01-03 ENCOUNTER — Other Ambulatory Visit: Payer: Self-pay | Admitting: Sports Medicine

## 2014-02-06 ENCOUNTER — Encounter: Payer: Self-pay | Admitting: Family Medicine

## 2014-02-06 ENCOUNTER — Ambulatory Visit (INDEPENDENT_AMBULATORY_CARE_PROVIDER_SITE_OTHER): Payer: BC Managed Care – PPO | Admitting: Family Medicine

## 2014-02-06 VITALS — BP 116/66 | HR 46 | Temp 98.1°F | Wt 148.0 lb

## 2014-02-06 DIAGNOSIS — Z7189 Other specified counseling: Secondary | ICD-10-CM | POA: Diagnosis not present

## 2014-02-06 DIAGNOSIS — H6980 Other specified disorders of Eustachian tube, unspecified ear: Secondary | ICD-10-CM

## 2014-02-06 DIAGNOSIS — H6981 Other specified disorders of Eustachian tube, right ear: Secondary | ICD-10-CM

## 2014-02-06 DIAGNOSIS — Z62820 Parent-biological child conflict: Secondary | ICD-10-CM | POA: Diagnosis not present

## 2014-02-06 DIAGNOSIS — H6990 Unspecified Eustachian tube disorder, unspecified ear: Secondary | ICD-10-CM | POA: Diagnosis not present

## 2014-02-06 MED ORDER — TRIAMCINOLONE ACETONIDE 55 MCG/ACT NA AERO: 2.0000 | INHALATION_SPRAY | Freq: Every day | NASAL | Status: DC

## 2014-02-06 NOTE — Progress Notes (Signed)
CC: Sydney Bailey is a 48 y.o. female is here for right ear pain   Subjective: HPI:  Complains of right ear pain described as fullness and spasm that has been present for the past week. Interventions have included Cortisporin without much benefit. Pain is nonradiating. Nothing seems to make it better or worse. He comes in waves throughout the day. She denies nasal congestion, cough, sore throat, shortness of breath, hearing loss, dizziness nor headaches  She would like to discuss her mother's current condition in regards to atrial fibrillation, procedures that are needed in the near future for correction of this and a concern that her mother may be overly worried about side effects from Coumadin.   Review Of Systems Outlined In HPI  Past Medical History  Diagnosis Date  . Hypothyroidism 11-08    started post- partum  . Allergy   . Anxiety   . Depression   . Migraines   . PVC's (premature ventricular contractions)     Pt saw Dr. Jens Som for this but pt stated it was fine  . PONV (postoperative nausea and vomiting)     Difficult waking up; had bad migraine after bunionectomy  . Family history of anesthesia complication     Father had difficult time waking up   . Heart palpitations     PVC's  . Shortness of breath     when having PVC's    Past Surgical History  Procedure Laterality Date  . Bunionectomy Bilateral     x 2 on both feet  . Essure btl    . Upper gi endoscopy    . Cholecystectomy N/A 05/22/2013    Procedure: LAPAROSCOPIC CHOLECYSTECTOMY;  Surgeon: Shelly Rubenstein, MD;  Location: [facility] OR;  Service: General;  Laterality: N/A;   Family History  Problem Relation Age of Onset  . Heart disease Father     Atrial fibrillation  . Cancer Maternal Aunt     breast  . Cancer Paternal Aunt     non hodgkins lymphoma  . Cancer Maternal Grandmother     breast    History   Social History  . Marital Status: Married    Spouse Name: N/A    Number of Children: 3  .  Years of Education: N/A   Occupational History  . Print production planner    Social History Main Topics  . Smoking status: Never Smoker   . Smokeless tobacco: Never Used  . Alcohol Use: No  . Drug Use: No  . Sexual Activity: Not on file   Other Topics Concern  . Not on file   Social History Narrative  . No narrative on file     Objective: BP 116/66  Pulse 46  Temp(Src) 98.1 F (36.7 C) (Oral)  Wt 148 lb (67.132 kg)  General: Alert and Oriented, No Acute Distress HEENT: Pupils equal, round, reactive to light. Conjunctivae clear.  External ears unremarkable, canals clear with intact TMs with appropriate landmarks.  Left Middle ear appears open without effusion. Right middle ear appears to have a moderate serous effusion Pink inferior turbinates.  Moist mucous membranes, pharynx without inflammation nor lesions.  Neck supple without palpable lymphadenopathy nor abnormal masses. Lungs: Clear and comfortable work of breathing Cardiac: Regular rate and rhythm. Skin: Warm and dry.  Assessment & Plan: Perl was seen today for right ear pain.  Diagnoses and associated orders for this visit:  Eustachian tube dysfunction, right - triamcinolone (NASACORT AQ) 55 MCG/ACT AERO nasal inhaler; Place 2 sprays  into the nose daily.  Counseling for parent-child problem    Eustachian tube dysfunction: She has a 16 hour flight to [place] in about a week and a half and I've encouraged her to be aggressive and take prednisone for 5 days however she would prefer to avoid taking this if possible due to irritability on this medication the past. Therefore start over-the-counter triamcinolone or fluticasone nasal steroid preparation which ever one is cheaper. Time was taken to this and to address her concerns with counseling regarding the patient's role in assisting her mother with the further evaluation and workup of atrial fibrillation.  25 minutes spent face-to-face during visit today of which at least 50%  was counseling or coordinating care regarding: 1. Eustachian tube dysfunction, right   2. Counseling for parent-child problem      Return if symptoms worsen or fail to improve.

## 2014-02-17 ENCOUNTER — Encounter: Payer: Self-pay | Admitting: Cardiology

## 2014-02-17 NOTE — Telephone Encounter (Signed)
Pt called said she talked to you yesterday,waiting to hear from you.

## 2014-02-17 NOTE — Telephone Encounter (Signed)
This encounter was created in error - please disregard.

## 2014-02-20 ENCOUNTER — Telehealth: Payer: Self-pay | Admitting: *Deleted

## 2014-02-20 MED ORDER — MONTELUKAST SODIUM 10 MG PO TABS: 10.0000 mg | ORAL_TABLET | Freq: Every day | ORAL | Status: DC

## 2014-02-20 MED ORDER — PREDNISONE 20 MG PO TABS: ORAL_TABLET | ORAL | Status: AC

## 2014-02-20 NOTE — Telephone Encounter (Signed)
Pt.notified

## 2014-02-20 NOTE — Telephone Encounter (Signed)
Pt left a message that the nasocort was not helping with the sxs with her ear. She says that it was discussed that a medication could be called in or she could be given an injection. Pt states he didn't want to take prednisone

## 2014-02-20 NOTE — Telephone Encounter (Signed)
Please see response to phone note for similar complaint today.

## 2014-02-20 NOTE — Telephone Encounter (Signed)
Prednisone for five days would have the best chance of providing relief, if she continues to have reservations about taking prednisone montelukast is an anti-allergy medication which has a moderate probability of helping with a very low chance of side effects.  Both Rx sent to [facility] in [place] for her to chose which she prefers.

## 2014-02-20 NOTE — Telephone Encounter (Signed)
Patient left VM on triage phone that the nasacort was not helping with her ear pain and she was now experiencing some vertigo. Please advise. Corliss Skains, CMA

## 2014-03-23 ENCOUNTER — Other Ambulatory Visit: Payer: Self-pay | Admitting: *Deleted

## 2014-03-23 MED ORDER — MONTELUKAST SODIUM 10 MG PO TABS: 10.0000 mg | ORAL_TABLET | Freq: Every day | ORAL | Status: DC

## 2014-04-21 ENCOUNTER — Other Ambulatory Visit: Payer: Self-pay | Admitting: Family Medicine

## 2014-05-18 ENCOUNTER — Other Ambulatory Visit: Payer: Self-pay | Admitting: Family Medicine

## 2014-07-12 ENCOUNTER — Other Ambulatory Visit: Payer: Self-pay | Admitting: Sports Medicine

## 2014-08-06 ENCOUNTER — Other Ambulatory Visit: Payer: Self-pay | Admitting: Family Medicine

## 2014-08-11 ENCOUNTER — Encounter: Payer: Self-pay | Admitting: Family Medicine

## 2014-08-11 ENCOUNTER — Ambulatory Visit (INDEPENDENT_AMBULATORY_CARE_PROVIDER_SITE_OTHER): Payer: BLUE CROSS/BLUE SHIELD | Admitting: Family Medicine

## 2014-08-11 VITALS — BP 121/63 | HR 77 | Wt 151.0 lb

## 2014-08-11 DIAGNOSIS — R0982 Postnasal drip: Secondary | ICD-10-CM

## 2014-08-11 DIAGNOSIS — R7989 Other specified abnormal findings of blood chemistry: Secondary | ICD-10-CM | POA: Diagnosis not present

## 2014-08-11 DIAGNOSIS — E039 Hypothyroidism, unspecified: Secondary | ICD-10-CM

## 2014-08-11 DIAGNOSIS — F32A Depression, unspecified: Secondary | ICD-10-CM | POA: Insufficient documentation

## 2014-08-11 DIAGNOSIS — F329 Major depressive disorder, single episode, unspecified: Secondary | ICD-10-CM | POA: Diagnosis not present

## 2014-08-11 DIAGNOSIS — G43009 Migraine without aura, not intractable, without status migrainosus: Secondary | ICD-10-CM

## 2014-08-11 LAB — TSH: TSH: 2.766 u[IU]/mL (ref 0.350–4.500)

## 2014-08-11 LAB — T3, FREE: T3, Free: 3 pg/mL (ref 2.3–4.2)

## 2014-08-11 LAB — T4, FREE: Free T4: 0.91 ng/dL (ref 0.80–1.80)

## 2014-08-11 MED ORDER — CITALOPRAM HYDROBROMIDE 40 MG PO TABS: ORAL_TABLET | ORAL | Status: DC

## 2014-08-11 MED ORDER — BECLOMETHASONE DIPROPIONATE 80 MCG/ACT NA AERS: INHALATION_SPRAY | NASAL | Status: DC

## 2014-08-11 NOTE — Progress Notes (Signed)
CC: Sydney Bailey is a 49 y.o. female is here for f/u on citalopram   Subjective: HPI:  Has been taking citalopram 40 mg  Since I saw her last. Mood has been stable without depression or anxiety. No thoughts of wanting to harm self or others. No known side effects or intolerance.  Follow-up hypothyroidism: Continues to take T4 1 120 g and T3 30 g non-porcine thyroid hormone. She's had some unintentional weight gain since I saw her last approximately 2 or 3 pounds. She continues to have diarrhea however this is improving the longer she's been out from her cholecystectomy. She denies any other gastrointestinal complaints. Diarrhea is reproducible with drinking coffee. Symptoms are currently mild in severity. No skin changes or complaints  Follow-up low progesterone: Continues to take 35 mg at bedtime. No hot flashes or vasomotor instability.  Complains of ear fullness bilaterally that has been present off and on ever since September of last year. Accompanied by dizziness for one day around Christmas however this only lasted a few hours and resolved without any intervention. Nasacort has not provided any benefit. Reports daily postnasal drip and a drainage sensation down the back of her throat. Other than singular no current interventions. Symptoms are mild to moderate in severity and present all hours of the day  Follow-up migraines: Denies any change in the character severity or frequency of her headaches. She estimates she is having about 12 migraines a month. Still gets benefit from Maxalt. She's currently not interested in any preventative medications  Review Of Systems Outlined In HPI  Past Medical History  Diagnosis Date  . Hypothyroidism 11-08    started post- partum  . Allergy   . Anxiety   . Depression   . Migraines   . PVC's (premature ventricular contractions)     Pt saw Dr. Jens Som for this but pt stated it was fine  . PONV (postoperative nausea and vomiting)    Difficult waking up; had bad migraine after bunionectomy  . Family history of anesthesia complication     Father had difficult time waking up   . Heart palpitations     PVC's  . Shortness of breath     when having PVC's    Past Surgical History  Procedure Laterality Date  . Bunionectomy Bilateral     x 2 on both feet  . Essure btl    . Upper gi endoscopy    . Cholecystectomy N/A 05/22/2013    Procedure: LAPAROSCOPIC CHOLECYSTECTOMY;  Surgeon: Shelly Rubenstein, MD;  Location: [facility] OR;  Service: General;  Laterality: N/A;   Family History  Problem Relation Age of Onset  . Heart disease Father     Atrial fibrillation  . Cancer Maternal Aunt     breast  . Cancer Paternal Aunt     non hodgkins lymphoma  . Cancer Maternal Grandmother     breast    History   Social History  . Marital Status: Married    Spouse Name: N/A  . Number of Children: 3  . Years of Education: N/A   Occupational History  . Print production planner    Social History Main Topics  . Smoking status: Never Smoker   . Smokeless tobacco: Never Used  . Alcohol Use: No  . Drug Use: No  . Sexual Activity: Not on file   Other Topics Concern  . Not on file   Social History Narrative     Objective: BP 121/63 mmHg  Pulse 77  Wt 151 lb (68.493 kg)  General: Alert and Oriented, No Acute Distress HEENT: Pupils equal, round, reactive to light. Conjunctivae clear.  External ears unremarkable, canals clear with intact TMs with appropriate landmarks.  Middle ear appears open without effusion. Pink inferior turbinates.  Moist mucous membranes, pharynx without inflammation nor lesions however mild cobblestoning.  Neck supple without palpable lymphadenopathy nor abnormal masses. Lungs: Clear comfortable work of breathing Cardiac: Regular rate and rhythm.  Extremities: No peripheral edema.  Strong peripheral pulses.  Mental Status: No depression, anxiety, nor agitation. Skin: Warm and dry.  Assessment & Plan: Sydney Bailey was  seen today for f/u on citalopram.  Diagnoses and all orders for this visit:  Hypothyroidism, unspecified hypothyroidism type Orders: -     TSH -     T3, free -     T4, free  Depression Orders: -     citalopram (CELEXA) 40 MG tablet; TAKE 1 TABLET (40 MG TOTAL) BY MOUTH DAILY.  Postnasal drip Orders: -     Beclomethasone Dipropionate (QNASL) 80 MCG/ACT AERS; Two sprays each nostril each day. -     Ambulatory referral to ENT  Low serum progesterone Orders: -     Progesterone  Migraine without aura and without status migrainosus, not intractable   Hypothyroidism: Due for thyroid studies above, will likely need [facility] prescription for [facility] Depression: Controlled continue citalopram Postnasal drip: Referral to ENT given persistence, start qnasl and if beneficial will provide with prescription, urged her to call me with response Migraines: Uncontrolled but she declines any preventative medicine due to a bad experience with Topamax, discussed that something like Effexor is different and Topamax but would help with anxiety depression and headaches however she politely declines  Ultimate follow-up be based on the above results  40 minutes spent face-to-face during visit today of which at least 50% was counseling or coordinating care regarding: 1. Hypothyroidism, unspecified hypothyroidism type   2. Depression   3. Postnasal drip   4. Low serum progesterone   5. Migraine without aura and without status migrainosus, not intractable      Return if symptoms worsen or fail to improve.

## 2014-08-12 ENCOUNTER — Telehealth: Payer: Self-pay

## 2014-08-12 LAB — PROGESTERONE: PROGESTERONE: 1.9 ng/mL

## 2014-08-12 MED ORDER — AMBULATORY NON FORMULARY MEDICATION: Status: DC

## 2014-08-12 NOTE — Telephone Encounter (Signed)
Refilled Thyroid and progestrone medication. Jamaurie Bernier,CMA

## 2014-10-19 ENCOUNTER — Other Ambulatory Visit: Payer: Self-pay | Admitting: Family Medicine

## 2014-11-10 ENCOUNTER — Other Ambulatory Visit: Payer: Self-pay | Admitting: Sports Medicine

## 2014-11-23 ENCOUNTER — Other Ambulatory Visit: Payer: Self-pay | Admitting: Family Medicine

## 2014-12-11 ENCOUNTER — Other Ambulatory Visit: Payer: Self-pay | Admitting: Obstetrics and Gynecology

## 2014-12-14 LAB — CYTOLOGY - PAP

## 2015-02-18 ENCOUNTER — Other Ambulatory Visit: Payer: Self-pay | Admitting: Family Medicine

## 2015-02-18 MED ORDER — CITALOPRAM HYDROBROMIDE 40 MG PO TABS: 40.0000 mg | ORAL_TABLET | Freq: Every day | ORAL | Status: DC

## 2015-02-18 NOTE — Addendum Note (Signed)
Addended by: Chalmers Cater on: 02/18/2015 02:48 PM   Modules accepted: Orders

## 2015-02-19 ENCOUNTER — Other Ambulatory Visit: Payer: Self-pay | Admitting: Family Medicine

## 2015-03-10 ENCOUNTER — Other Ambulatory Visit: Payer: Self-pay | Admitting: Family Medicine

## 2015-04-09 ENCOUNTER — Other Ambulatory Visit: Payer: Self-pay | Admitting: Family Medicine

## 2015-06-05 ENCOUNTER — Other Ambulatory Visit: Payer: Self-pay | Admitting: Family Medicine

## 2015-06-17 ENCOUNTER — Other Ambulatory Visit: Payer: Self-pay

## 2015-06-17 MED ORDER — CITALOPRAM HYDROBROMIDE 40 MG PO TABS: 40.0000 mg | ORAL_TABLET | Freq: Every day | ORAL | Status: DC

## 2015-06-17 MED ORDER — MONTELUKAST SODIUM 10 MG PO TABS: 10.0000 mg | ORAL_TABLET | Freq: Every day | ORAL | Status: DC

## 2015-06-17 MED ORDER — RIZATRIPTAN BENZOATE 10 MG PO TBDP: 10.0000 mg | ORAL_TABLET | Freq: Once | ORAL | Status: DC

## 2015-06-22 ENCOUNTER — Ambulatory Visit: Payer: BLUE CROSS/BLUE SHIELD | Admitting: Family Medicine

## 2015-06-22 ENCOUNTER — Other Ambulatory Visit: Payer: Self-pay

## 2015-06-22 MED ORDER — RIZATRIPTAN BENZOATE 10 MG PO TBDP: 10.0000 mg | ORAL_TABLET | Freq: Once | ORAL | Status: DC

## 2015-06-22 MED ORDER — CITALOPRAM HYDROBROMIDE 40 MG PO TABS: 40.0000 mg | ORAL_TABLET | Freq: Every day | ORAL | Status: DC

## 2015-06-24 ENCOUNTER — Ambulatory Visit (INDEPENDENT_AMBULATORY_CARE_PROVIDER_SITE_OTHER): Payer: 59 | Admitting: Family Medicine

## 2015-06-24 ENCOUNTER — Encounter: Payer: Self-pay | Admitting: Family Medicine

## 2015-06-24 VITALS — BP 121/63

## 2015-06-24 DIAGNOSIS — G43009 Migraine without aura, not intractable, without status migrainosus: Secondary | ICD-10-CM

## 2015-06-24 DIAGNOSIS — F410 Panic disorder [episodic paroxysmal anxiety] without agoraphobia: Secondary | ICD-10-CM

## 2015-06-24 DIAGNOSIS — E039 Hypothyroidism, unspecified: Secondary | ICD-10-CM

## 2015-06-24 LAB — LIPID PANEL
CHOL/HDL RATIO: 3.9 ratio (ref <=5.0)
Cholesterol: 168 mg/dL (ref 125–200)
HDL: 43 mg/dL — AB (ref >=46)
LDL CALC: 104 mg/dL (ref <130)
Triglycerides: 104 mg/dL (ref <150)
VLDL: 21 mg/dL (ref <30)

## 2015-06-24 LAB — T3, FREE: T3, Free: 3.1 pg/mL (ref 2.3–4.2)

## 2015-06-24 LAB — TSH: TSH: 3.515 u[IU]/mL (ref 0.350–4.500)

## 2015-06-24 LAB — T4, FREE: FREE T4: 0.97 ng/dL (ref 0.80–1.80)

## 2015-06-24 MED ORDER — RIZATRIPTAN BENZOATE 10 MG PO TBDP: 10.0000 mg | ORAL_TABLET | Freq: Once | ORAL | Status: DC

## 2015-06-24 MED ORDER — CITALOPRAM HYDROBROMIDE 40 MG PO TABS: 40.0000 mg | ORAL_TABLET | Freq: Every day | ORAL | Status: DC

## 2015-06-24 NOTE — Progress Notes (Signed)
CC: Sydney Bailey Bailey is a 50 y.o. female is here for Medication Refill   Subjective: HPI:  Follow-up panic attacks: She's not had a take Xanax in a matter of months. She's taking citalopram on a daily basis with no known side effects. She denies any anxiety mental disturbance or depression.  Follow-up migraines: She was told that she had vestibular migraines and Singulair would help with this. She ran out of this medication one week ago when she's not noticed any new sinus complaints or allergies. She experiences 1-2 migraines a month and denies any change in the character severity or frequency of these. She is not convinced that Singulair was helping reduce her migraines.  Follow-up hypothyroidism: She denies any unintentional weight loss or gain. There's been no skin changes or gastrointestinal complaints. She is taking a compounded thyroid supplement without any noted side effects.  Denies fevers, chills, cough, wheezing, shortness of breath or chest pain   Review Of Systems Outlined In HPI  Past Medical History  Diagnosis Date  . Hypothyroidism 11-08    started post- partum  . Allergy   . Anxiety   . Depression   . Migraines   . PVC's (premature ventricular contractions)     Pt saw Dr. Jens Som for this but pt stated it was fine  . PONV (postoperative nausea and vomiting)     Difficult waking up; had bad migraine after bunionectomy  . Family history of anesthesia complication     Father had difficult time waking up   . Heart palpitations     PVC's  . Shortness of breath     when having PVC's    Past Surgical History  Procedure Laterality Date  . Bunionectomy Bilateral     x 2 on both feet  . Essure btl    . Upper gi endoscopy    . Cholecystectomy N/A 05/22/2013    Procedure: LAPAROSCOPIC CHOLECYSTECTOMY;  Surgeon: Shelly Rubenstein, MD;  Location: [facility] OR;  Service: General;  Laterality: N/A;   Family History  Problem Relation Age of Onset  . Heart disease Father      Atrial fibrillation  . Cancer Maternal Aunt     breast  . Cancer Paternal Aunt     non hodgkins lymphoma  . Cancer Maternal Grandmother     breast    Social History   Social History  . Marital Status: Married    Spouse Name: N/A  . Number of Children: 3  . Years of Education: N/A   Occupational History  . Print production planner    Social History Main Topics  . Smoking status: Never Smoker   . Smokeless tobacco: Never Used  . Alcohol Use: No  . Drug Use: No  . Sexual Activity: Not on file   Other Topics Concern  . Not on file   Social History Narrative     Objective: BP 121/63 mmHg  General: Alert and Oriented, No Acute Distress HEENT: Pupils equal, round, reactive to light. Conjunctivae clear.  External ears unremarkable, canals clear with intact TMs with appropriate landmarks.  Middle ear appears open without effusion. Pink inferior turbinates.  Moist mucous membranes, pharynx without inflammation nor lesions.  Neck supple without palpable lymphadenopathy nor abnormal masses. Lungs: Clear to auscultation bilaterally, no wheezing/ronchi/rales.  Comfortable work of breathing. Good air movement. Cardiac: Regular rate and rhythm. Normal S1/S2.  No murmurs, rubs, nor gallops.   Extremities: No peripheral edema.  Strong peripheral pulses.  Mental Status: No depression, anxiety,  nor agitation. Skin: Warm and dry.  Assessment & Plan: Sydney Bailey Bailey was seen today for medication refill.  Diagnoses and all orders for this visit:  Hypothyroidism, unspecified hypothyroidism type -     TSH -     T3, free -     T4, free -     Progesterone -     Lipid panel  Migraine without aura and without status migrainosus, not intractable -     rizatriptan (MAXALT-MLT) 10 MG disintegrating tablet; Take 1 tablet (10 mg total) by mouth once. May repeat in 2 hours if needed.  PANIC ATTACK -     citalopram (CELEXA) 40 MG tablet; Take 1 tablet (40 mg total) by mouth daily.   Hypothyroidism:  Clinically controlled due for repeat TSH and free thyroid levels. Refills of her thyroid supplement will be based on the above results Migraines: It does not sound like Singulair was providing any preventative benefit from her migraines, she'll temporarily stop this but I will keep her on her med list if it is needed in the spring. Continue as needed maxalt. Panic attacks: Controlled with citalopram no need for refills on alprazolam.  Based on lab she'll need [facility]  Return in about 6 months (around 12/22/2015).

## 2015-06-25 ENCOUNTER — Telehealth: Payer: Self-pay | Admitting: Family Medicine

## 2015-06-25 LAB — PROGESTERONE: Progesterone: 1.3 ng/mL

## 2015-06-25 MED ORDER — AMBULATORY NON FORMULARY MEDICATION: Status: DC

## 2015-06-25 NOTE — Telephone Encounter (Signed)
Will you please let patient know that her progesterone, thyroid labs, and cholesterol are all normal and controlled.  I'll ask that her supplements be sent to [facility]. (in your in box)

## 2015-06-25 NOTE — Telephone Encounter (Signed)
Pt.notified

## 2015-07-18 ENCOUNTER — Other Ambulatory Visit: Payer: Self-pay | Admitting: Family Medicine

## 2015-09-24 ENCOUNTER — Other Ambulatory Visit: Payer: Self-pay | Admitting: Family Medicine

## 2015-10-14 ENCOUNTER — Other Ambulatory Visit: Payer: Self-pay

## 2015-10-14 MED ORDER — CITALOPRAM HYDROBROMIDE 40 MG PO TABS: ORAL_TABLET | ORAL | Status: DC

## 2015-11-15 ENCOUNTER — Other Ambulatory Visit: Payer: Self-pay | Admitting: Family Medicine

## 2016-01-08 ENCOUNTER — Other Ambulatory Visit: Payer: Self-pay | Admitting: Family Medicine

## 2016-02-01 ENCOUNTER — Ambulatory Visit (INDEPENDENT_AMBULATORY_CARE_PROVIDER_SITE_OTHER): Payer: 59 | Admitting: Family Medicine

## 2016-02-01 ENCOUNTER — Encounter: Payer: Self-pay | Admitting: Family Medicine

## 2016-02-01 VITALS — BP 120/76 | HR 41 | Wt 148.0 lb

## 2016-02-01 DIAGNOSIS — R61 Generalized hyperhidrosis: Secondary | ICD-10-CM

## 2016-02-01 DIAGNOSIS — E039 Hypothyroidism, unspecified: Secondary | ICD-10-CM | POA: Diagnosis not present

## 2016-02-01 DIAGNOSIS — N898 Other specified noninflammatory disorders of vagina: Secondary | ICD-10-CM

## 2016-02-01 DIAGNOSIS — L74519 Primary focal hyperhidrosis, unspecified: Secondary | ICD-10-CM

## 2016-02-01 DIAGNOSIS — N9489 Other specified conditions associated with female genital organs and menstrual cycle: Secondary | ICD-10-CM

## 2016-02-01 LAB — TSH: TSH: 3.34 m[IU]/L

## 2016-02-01 LAB — T4, FREE: Free T4: 0.9 ng/dL (ref 0.8–1.8)

## 2016-02-01 NOTE — Progress Notes (Signed)
CC: Sydney Bailey is a 50 y.o. female is here for Alopecia; Excessive Sweating; Fatigue; and Generalized Body Aches   Subjective: HPI:  For the past 3 months she's had issues with truncal sweating. It'll happen without any warning in both warm and cold environments. It is not uncommon for her to have to change her undergarments 1-2 times a day. It happens most days out of the week. It's not getting in the way for quality of life and embarrassing her. No interventions as of yet. Denies any unintentional weight loss or gain. No dietary changes. She tells me that she feels like she is probably having hot flashes. She is taking a combination oral contraceptives along with additional progesterone. She's had a little bit of hair loss on the scalp which is greater than what she is used to being her baseline. She is taking the combination contraceptive pill on a daily basis and has had no vaginal bleeding since this was started however over the past week has had a vaginal odor that metronidazole has not helped.   Review Of Systems Outlined In HPI  Past Medical History:  Diagnosis Date  . Allergy   . Anxiety   . Depression   . Family history of anesthesia complication    Father had difficult time waking up   . Heart palpitations    PVC's  . Hypothyroidism 11-08   started post- partum  . Migraines   . PONV (postoperative nausea and vomiting)    Difficult waking up; had bad migraine after bunionectomy  . PVC's (premature ventricular contractions)    Pt saw Dr. Jens Somrenshaw for this but pt stated it was fine  . Shortness of breath    when having PVC's    Past Surgical History:  Procedure Laterality Date  . BUNIONECTOMY Bilateral    x 2 on both feet  . CHOLECYSTECTOMY N/A 05/22/2013   Procedure: LAPAROSCOPIC CHOLECYSTECTOMY;  Surgeon: Shelly Rubensteinouglas A Blackman, MD;  Location: [facility] OR;  Service: General;  Laterality: N/A;  . essure BTL    . UPPER GI ENDOSCOPY     Family History  Problem Relation Age  of Onset  . Heart disease Father     Atrial fibrillation  . Cancer Maternal Aunt     breast  . Cancer Paternal Aunt     non hodgkins lymphoma  . Cancer Maternal Grandmother     breast    Social History   Social History  . Marital status: Married    Spouse name: N/A  . Number of children: 3  . Years of education: N/A   Occupational History  . Print production plannerffice Manager    Social History Main Topics  . Smoking status: Never Smoker  . Smokeless tobacco: Never Used  . Alcohol use No  . Drug use: No  . Sexual activity: Not on file   Other Topics Concern  . Not on file   Social History Narrative  . No narrative on file     Objective: BP 120/76   Pulse (!) 41   Wt 148 lb (67.1 kg)   BMI 23.53 kg/m   Vital signs reviewed. General: Alert and Oriented, No Acute Distress HEENT: Pupils equal, round, reactive to light. Conjunctivae clear.  External ears unremarkable.  Moist mucous membranes. Lungs: Clear and comfortable work of breathing, speaking in full sentences without accessory muscle use. Cardiac: Regular rate and rhythm.  Neuro: CN II-XII grossly intact, gait normal. Extremities: No peripheral edema.  Strong peripheral pulses.  Mental  Status: No depression, anxiety, nor agitation. Logical though process. Skin: Warm and dry.  Assessment & Plan: Sydney Bailey was seen today for alopecia, excessive sweating, fatigue and generalized body aches.  Diagnoses and all orders for this visit:  Hypothyroidism, unspecified hypothyroidism type -     TSH -     T4, free -     Progesterone  Vaginal odor  Hyperhydrosis disorder -     TSH -     T4, free -     Progesterone   Possible that her progesterone is not at the right level or her thyroid supplementation is not adequate or is overdosed. Ultimate plan will be based on above labs  25 minutes spent face-to-face during visit today of which at least 50% was counseling or coordinating care regarding: 1. Hypothyroidism, unspecified  hypothyroidism type   2. Vaginal odor   3. Hyperhydrosis disorder     Discussed with this patient that I will be resigning from my position here with [facility] in September in order to stay with my family who will be moving to [facility]. I let him know about the providers that are still accepting patients and I feel that this individual will be under great care if he/she stays here with [facility]. No Follow-up on file.

## 2016-02-02 LAB — PROGESTERONE: PROGESTERONE: 1.4 ng/mL

## 2016-02-09 ENCOUNTER — Telehealth: Payer: Self-pay

## 2016-02-09 MED ORDER — AMBULATORY NON FORMULARY MEDICATION: 0 refills | Status: DC

## 2016-02-09 NOTE — Telephone Encounter (Signed)
She wants to know when should she get her levels again?

## 2016-02-09 NOTE — Telephone Encounter (Signed)
Sydney ArntWonderful, Sydney Bailey, New Rx placed in in-box ready for pickup/faxing to [facility]

## 2016-02-09 NOTE — Telephone Encounter (Signed)
Pt.notified

## 2016-02-09 NOTE — Telephone Encounter (Signed)
As soon as a month from now but no longer than two months from now.

## 2016-02-10 NOTE — Telephone Encounter (Signed)
Pt notified and will call office for lab slip

## 2016-02-15 ENCOUNTER — Other Ambulatory Visit: Payer: Self-pay | Admitting: *Deleted

## 2016-02-15 MED ORDER — CITALOPRAM HYDROBROMIDE 40 MG PO TABS: 40.0000 mg | ORAL_TABLET | Freq: Every day | ORAL | 0 refills | Status: DC

## 2016-04-06 ENCOUNTER — Telehealth: Payer: Self-pay

## 2016-04-06 DIAGNOSIS — N920 Excessive and frequent menstruation with regular cycle: Secondary | ICD-10-CM

## 2016-04-06 NOTE — Telephone Encounter (Signed)
Sydney Bailey called and states she is due for labs for her hormone replacement therapy. I advised her [facility] She still would like to get her labs before the appointment. Please advise.

## 2016-04-09 ENCOUNTER — Other Ambulatory Visit: Payer: Self-pay | Admitting: Physician Assistant

## 2016-04-10 NOTE — Telephone Encounter (Signed)
Ok to order progesterone lab to have checked before appt. Please call patient and let her know that was done.

## 2016-04-11 NOTE — Telephone Encounter (Signed)
Labs ordered. Pt notified.

## 2016-04-15 LAB — PROGESTERONE: PROGESTERONE: 4.4 ng/mL

## 2016-04-21 ENCOUNTER — Ambulatory Visit (INDEPENDENT_AMBULATORY_CARE_PROVIDER_SITE_OTHER): Payer: 59 | Admitting: Physician Assistant

## 2016-04-21 ENCOUNTER — Encounter: Payer: Self-pay | Admitting: Physician Assistant

## 2016-04-21 VITALS — BP 118/65 | HR 45 | Ht 66.5 in | Wt 137.0 lb

## 2016-04-21 DIAGNOSIS — G43009 Migraine without aura, not intractable, without status migrainosus: Secondary | ICD-10-CM

## 2016-04-21 DIAGNOSIS — Z23 Encounter for immunization: Secondary | ICD-10-CM

## 2016-04-21 DIAGNOSIS — I493 Ventricular premature depolarization: Secondary | ICD-10-CM | POA: Diagnosis not present

## 2016-04-21 MED ORDER — AMBULATORY NON FORMULARY MEDICATION: 1 refills | Status: DC

## 2016-04-21 MED ORDER — NORETHIN ACE-ETH ESTRAD-FE 1-20 MG-MCG PO TABS: 1.0000 | ORAL_TABLET | Freq: Every day | ORAL | 11 refills | Status: DC

## 2016-04-21 MED ORDER — RIZATRIPTAN BENZOATE 10 MG PO TBDP: 10.0000 mg | ORAL_TABLET | Freq: Once | ORAL | 11 refills | Status: DC

## 2016-04-21 MED ORDER — CITALOPRAM HYDROBROMIDE 40 MG PO TABS: 40.0000 mg | ORAL_TABLET | Freq: Every day | ORAL | 3 refills | Status: DC

## 2016-04-21 MED ORDER — NEBIVOLOL HCL 2.5 MG PO TABS: 2.5000 mg | ORAL_TABLET | Freq: Every day | ORAL | 1 refills | Status: DC

## 2016-04-22 LAB — CBC
HEMATOCRIT: 42.7 % (ref 35.0–45.0)
HEMOGLOBIN: 14.6 g/dL (ref 11.7–15.5)
MCH: 32.8 pg (ref 27.0–33.0)
MCHC: 34.2 g/dL (ref 32.0–36.0)
MCV: 96 fL (ref 80.0–100.0)
MPV: 10.3 fL (ref 7.5–12.5)
PLATELETS: 277 10*3/uL (ref 140–400)
RBC: 4.45 MIL/uL (ref 3.80–5.10)
RDW: 12.7 % (ref 11.0–15.0)
WBC: 7.5 10*3/uL (ref 3.8–10.8)

## 2016-04-22 LAB — TSH: TSH: 3.14 mIU/L

## 2016-04-22 LAB — FERRITIN: Ferritin: 40 ng/mL (ref 10–232)

## 2016-04-22 LAB — T4, FREE: Free T4: 1 ng/dL (ref 0.8–1.8)

## 2016-04-22 LAB — T3, FREE: T3 FREE: 2.9 pg/mL (ref 2.3–4.2)

## 2016-04-22 LAB — BASIC METABOLIC PANEL WITH GFR
BUN: 9 mg/dL (ref 7–25)
CO2: 24 mmol/L (ref 20–31)
Calcium: 9 mg/dL (ref 8.6–10.2)
Chloride: 104 mmol/L (ref 98–110)
Creat: 0.7 mg/dL (ref 0.50–1.10)
GFR, Est Non African American: >89 mL/min (ref >=60)
GLUCOSE: 90 mg/dL (ref 65–99)
POTASSIUM: 4.2 mmol/L (ref 3.5–5.3)
Sodium: 139 mmol/L (ref 135–146)

## 2016-04-25 ENCOUNTER — Encounter: Payer: Self-pay | Admitting: Physician Assistant

## 2016-04-25 NOTE — Progress Notes (Signed)
Subjective:    Patient ID: Sydney Bailey, female    DOB: 10-12-1965, 50 y.o.   MRN: [id number]  HPI Pt is a 50 yo female who presents to the clinic to follow up on progesterone medication and labs. Her hormones were originally tested because of excessive sweating and mood changes. She does feel like symptoms have improved since starting progesterone but not resolved.   She also just feels exahausted all the time. She has hx of PVC but never felt like this. She saw Dr. Jens Somrenshaw once for PVC's but then she was released.    .. Active Ambulatory Problems    Diagnosis Date Noted  . Hypothyroidism 11/22/2007  . PANIC ATTACK 10/02/2008  . Migraine without aura 11/22/2007  . MENORRHAGIA 07/27/2008  . Palpitations 03/19/2013  . Dyspnea 03/19/2013  . Depression 08/11/2014  . Ventricular quadrigeminy 04/21/2016  . PVC's (premature ventricular contractions) 04/21/2016   Resolved Ambulatory Problems    Diagnosis Date Noted  . SYNCOPE 02/20/2008  . POSTURAL LIGHTHEADEDNESS 12/25/2007  . FATIGUE 07/27/2008   Past Medical History:  Diagnosis Date  . Allergy   . Anxiety   . Depression   . Family history of anesthesia complication   . Heart palpitations   . Hypothyroidism 11-08  . Migraines   . PONV (postoperative nausea and vomiting)   . PVC's (premature ventricular contractions)   . Shortness of breath    .Marland Kitchen. Family History  Problem Relation Age of Onset  . Heart disease Father     Atrial fibrillation  . Cancer Maternal Aunt     breast  . Cancer Paternal Aunt     non hodgkins lymphoma  . Cancer Maternal Grandmother     breast     Review of Systems    see HPI.  Objective:   Physical Exam  Constitutional: She is oriented to person, place, and time. She appears well-developed and well-nourished.  HENT:  Head: Normocephalic and atraumatic.  Cardiovascular:  Irregular beats in a regular pattern noted on exam today. No murmurs.   Pulmonary/Chest: Effort normal and  breath sounds normal.  Neurological: She is alert and oriented to person, place, and time.  Psychiatric: She has a normal mood and affect. Her behavior is normal.          Assessment & Plan:  Marland Kitchen.Marland Kitchen.Diagnoses and all orders for this visit:  Ventricular quadrigeminy -     nebivolol (BYSTOLIC) 2.5 MG tablet; Take 1 tablet (2.5 mg total) by mouth daily.  Influenza vaccine needed -     Flu Vaccine QUAD 36+ mos PF IM (Fluarix & Fluzone Quad PF)  Migraine without aura and without status migrainosus, not intractable -     rizatriptan (MAXALT-MLT) 10 MG disintegrating tablet; Take 1 tablet (10 mg total) by mouth once. May repeat in 2 hours if needed.  PVC's (premature ventricular contractions) -     BASIC METABOLIC PANEL WITH GFR -     TSH -     nebivolol (BYSTOLIC) 2.5 MG tablet; Take 1 tablet (2.5 mg total) by mouth daily. -     CBC -     Ferritin -     T4, free -     T3, free  Other orders -     AMBULATORY NON FORMULARY MEDICATION; Progesterone capsules Take four 35 mg SR progesterone capsules daily at bedtime -     citalopram (CELEXA) 40 MG tablet; Take 1 tablet (40 mg total) by mouth daily. -  norethindrone-ethinyl estradiol (JUNEL FE,GILDESS FE,LOESTRIN FE) 1-20 MG-MCG tablet; Take 1 tablet by mouth daily.   Pt appeared to be bradycardic with vitals but EKG showed sinus rhythm at 83 with occasional PVC and PAC. Ventricular Quadrigeminy was dx. Consulted with Dr. Karie Schwalbe in office and agreed BB should make PVC become less frequent and possibly make feel better. bystolic started today. Compared with 2014 EKG with no PVC or PAC. Follow up in 1 month.   Progesterone still a little low. Doubled to 4 tablets daily. Recheck labs in 2 months.

## 2016-04-26 NOTE — Addendum Note (Signed)
Addended by: Collie SiadICHARDSON, Adryel Wortmann M on: 04/26/2016 03:54 PM   Modules accepted: Orders

## 2016-06-09 ENCOUNTER — Encounter: Payer: Self-pay | Admitting: Physician Assistant

## 2016-06-09 ENCOUNTER — Ambulatory Visit (INDEPENDENT_AMBULATORY_CARE_PROVIDER_SITE_OTHER): Payer: 59 | Admitting: Physician Assistant

## 2016-06-09 VITALS — BP 92/56 | HR 64 | Ht 66.5 in | Wt 137.0 lb

## 2016-06-09 DIAGNOSIS — F410 Panic disorder [episodic paroxysmal anxiety] without agoraphobia: Secondary | ICD-10-CM

## 2016-06-09 DIAGNOSIS — I493 Ventricular premature depolarization: Secondary | ICD-10-CM

## 2016-06-09 DIAGNOSIS — R7989 Other specified abnormal findings of blood chemistry: Secondary | ICD-10-CM | POA: Diagnosis not present

## 2016-06-09 MED ORDER — NEBIVOLOL HCL 2.5 MG PO TABS: 2.5000 mg | ORAL_TABLET | Freq: Every day | ORAL | 5 refills | Status: DC

## 2016-06-09 MED ORDER — NEBIVOLOL HCL 2.5 MG PO TABS: 2.5000 mg | ORAL_TABLET | Freq: Every day | ORAL | 1 refills | Status: DC

## 2016-06-09 NOTE — Patient Instructions (Signed)
Palpitations Introduction A palpitation is the feeling that your heart:  Has an uneven (irregular) heartbeat.  Is beating faster than normal.  Is fluttering.  Is skipping a beat. This is usually not a serious problem. In some cases, you may need more medical tests. Follow these instructions at home:  Avoid:  Caffeine in coffee, tea, soft drinks, diet pills, and energy drinks.  Chocolate.  Alcohol.  Do not use any tobacco products. These include cigarettes, chewing tobacco, and e-cigarettes. If you need help quitting, ask your doctor.  Try to reduce your stress. These things may help:  Yoga.  Meditation.  Physical activity. Swimming, jogging, and walking are good choices.  A method that helps you use your mind to control things in your body, like heartbeats (biofeedback).  Get plenty of rest and sleep.  Take over-the-counter and prescription medicines only as told by your doctor.  Keep all follow-up visits as told by your doctor. This is important. Contact a doctor if:  Your heartbeat is still fast or uneven after 24 hours.  Your palpitations occur more often. Get help right away if:  You have chest pain.  You feel short of breath.  You have a very bad headache.  You feel dizzy.  You pass out (faint). This information is not intended to replace advice given to you by your health care provider. Make sure you discuss any questions you have with your health care provider. Document Released: 02/29/2008 Document Revised: 10/28/2015 Document Reviewed: 02/04/2015  2017 Elsevier  

## 2016-06-09 NOTE — Progress Notes (Addendum)
   Subjective:    Patient ID: Sydney Bailey, female    DOB: 02/22/1966, 51 y.o.   MRN: [id number]  HPI Patient is a 51yo female who presents to the clinic for a follow-up visit after starting Nebivolol for her ventricular quadrigeminy/PVCs.  Patient reports her PVCs have improved significantly.  She describes them as "episodic" and only has 2-3 palpitations today and some days do not have any. She denies any CP, dizziness, or headaches.  Patient reports her fatigue has greatly improved.  Patient states she has recently been having a mild increase in her anxiety and wonders if it is a side effect of the Beta Blocker.   Patient is unsure if she is taking the correct dose of progesterone since it was last increased.  Patient does report her symptoms have improved.  She is still having occasional hot flashes. She is taking 2 tablets of progesterone just unsure if 35mcg or 70mcg.        Review of Systems  Constitutional: Fatigue: fatigue has improved.  Cardiovascular: Positive for palpitations (Patient reports episodic palpitations 2-3 times per day.).  All other systems reviewed and are negative.      Objective:   Physical Exam  Constitutional: She is oriented to person, place, and time. She appears well-developed and well-nourished.  HENT:  Head: Normocephalic and atraumatic.  Cardiovascular: Normal rate, regular rhythm and normal heart sounds.   Pulmonary/Chest: Effort normal and breath sounds normal.  Neurological: She is alert and oriented to person, place, and time.  Psychiatric: She has a normal mood and affect. Her behavior is normal.  Nursing note reviewed.         Assessment & Plan:  Marland Kitchen.Marland Kitchen.Diagnoses and all orders for this visit:  Low serum progesterone -     Progesterone  PVC's (premature ventricular contractions) -     Discontinue: nebivolol (BYSTOLIC) 2.5 MG tablet; Take 1 tablet (2.5 mg total) by mouth daily. -     nebivolol (BYSTOLIC) 2.5 MG tablet; Take 1 tablet  (2.5 mg total) by mouth daily.  Ventricular quadrigeminy -     Discontinue: nebivolol (BYSTOLIC) 2.5 MG tablet; Take 1 tablet (2.5 mg total) by mouth daily. -     nebivolol (BYSTOLIC) 2.5 MG tablet; Take 1 tablet (2.5 mg total) by mouth daily.  Patient's symptoms are improving with her progesterone.  Patient is going to confirm the current dose she has been taking.  She will increase her dose if she is only taking 70mg  to 140mg .  If the patient is taking 140mg , she will get her serum progesterone level checked.  Patient needs to continue taking her Bystolic 2.5mg .  The dose will not be increased at this time due to her pulse being 64 and since her symptoms are improving.  Patient agrees with this decision.  Patient was educated on the triggers of PVCs, such as caffeine, chocolate, and alcohol.  Patient is going to try keep a journal of when she has PVCs to try to identify triggers. Patient was also educated on the fact that her Bystolic should not cause anxiety and may actually improve her anxiety.   Follow up in 6 months.

## 2016-06-20 ENCOUNTER — Telehealth: Payer: Self-pay | Admitting: *Deleted

## 2016-06-20 ENCOUNTER — Other Ambulatory Visit: Payer: Self-pay | Admitting: Physician Assistant

## 2016-06-20 DIAGNOSIS — I493 Ventricular premature depolarization: Secondary | ICD-10-CM

## 2016-06-20 NOTE — Telephone Encounter (Signed)
Yes go ahead and increase to 210mg  of progesterone. Do you need refills?   I will make referral for Dr. Jens Somrenshaw.

## 2016-06-20 NOTE — Telephone Encounter (Signed)
Pt called today stating that over the last few weeks she's had several "episodes" of pvcs & overall feeling blah.  She wants to know if she should see Dr. Jens Somrenshaw now at this point. Also, on her progesterone, she HAS been taking 140mg  qhs.  Her question is, since she is still experiencing some symptoms of that, she she increase by 70mg ?  Please advise.

## 2016-06-23 NOTE — Telephone Encounter (Signed)
LMOM notifying pt. 

## 2016-07-13 NOTE — Progress Notes (Signed)
Referring: Jomarie Longs PA-C  HPI: 51 yo female for evaluation of palpitations. Seen previously but not since 10/14. Echo 11/14 showed normal LV systolic function. TSH, Hgb, K normal 11/17. Patient has had increased palpitations since October. They are described as a skip. She also notes fatigue. She was placed on bysystolic 2.5 mg daily with some improvement. She also notes dyspnea at times. This can occur both with exertion and at rest. There is no orthopnea, PND, pedal edema, chest pain or syncope. Because of the above we were asked to evaluate.  Current Outpatient Prescriptions  Medication Sig Dispense Refill  . AMBULATORY NON FORMULARY MEDICATION Medication Name: T4 and T3 30 mcg NON PORCINE SR capsules. 90 capsule 1  . AMBULATORY NON FORMULARY MEDICATION Progesterone capsules Take four 35 mg SR progesterone capsules daily at bedtime 120 capsule 1  . citalopram (CELEXA) 40 MG tablet Take 1 tablet (40 mg total) by mouth daily. 90 tablet 3  . nebivolol (BYSTOLIC) 2.5 MG tablet Take 1 tablet (2.5 mg total) by mouth daily. 90 tablet 1  . norethindrone-ethinyl estradiol (JUNEL FE,GILDESS FE,LOESTRIN FE) 1-20 MG-MCG tablet Take 1 tablet by mouth daily. 1 Package 11  . rizatriptan (MAXALT-MLT) 10 MG disintegrating tablet Take 1 tablet (10 mg total) by mouth once. May repeat in 2 hours if needed. 10 tablet 11   No current facility-administered medications for this visit.     Allergies  Allergen Reactions  . Baclofen     REACTION: hives  . Cyclobenzaprine Hcl     REACTION: hives  . Etodolac Hives  . Topamax Other (See Comments)    Vertigo as well as every side effect that is caused by this med.     Past Medical History:  Diagnosis Date  . Allergy   . Anxiety   . Depression   . Family history of anesthesia complication    Father had difficult time waking up   . Heart palpitations    PVC's  . Hypothyroidism 11-08   started post- partum  . Migraines   . PONV  (postoperative nausea and vomiting)    Difficult waking up; had bad migraine after bunionectomy  . PVC's (premature ventricular contractions)    Pt saw Dr. Jens Som for this but pt stated it was fine    Past Surgical History:  Procedure Laterality Date  . BUNIONECTOMY Bilateral    x 2 on both feet  . CHOLECYSTECTOMY N/A 05/22/2013   Procedure: LAPAROSCOPIC CHOLECYSTECTOMY;  Surgeon: Shelly Rubenstein, MD;  Location: [facility] OR;  Service: General;  Laterality: N/A;  . essure BTL    . UPPER GI ENDOSCOPY      Social History   Social History  . Marital status: Married    Spouse name: N/A  . Number of children: 3  . Years of education: N/A   Occupational History  . Print production planner    Social History Main Topics  . Smoking status: Never Smoker  . Smokeless tobacco: Never Used  . Alcohol use No  . Drug use: No  . Sexual activity: Not on file   Other Topics Concern  . Not on file   Social History Narrative  . No narrative on file    Family History  Problem Relation Age of Onset  . Heart disease Father     Atrial fibrillation  . Atrial fibrillation Father   . Atrial fibrillation Mother   . Cancer Maternal Aunt     breast  . Cancer  Paternal Aunt     non hodgkins lymphoma  . Cancer Maternal Grandmother     breast    ROS: fatigue but no fevers or chills, productive cough, hemoptysis, dysphasia, odynophagia, melena, hematochezia, dysuria, hematuria, rash, seizure activity, orthopnea, PND, pedal edema, claudication. Remaining systems are negative.  Physical Exam:   Blood pressure 99/60, pulse 73, height 5' 6.5" (1.689 m), weight 143 lb 6.4 oz (65 kg).  General:  Well developed/well nourished in NAD Skin warm/dry Patient not depressed No peripheral clubbing Back-normal HEENT-normal/normal eyelids Neck supple/normal carotid upstroke bilaterally; no JVD; no thyromegaly chest - CTA/ normal expansion CV - RRR/normal S1 and S2; no murmurs, rubs or gallops;  PMI  nondisplaced Abdomen -NT/ND, no HSM, no mass, + bowel sounds, no bruit 2+ femoral pulses, no bruits Ext-no edema, chords, 2+ DP Neuro-grossly nonfocal  ECG 04/21/16-Sinus rhythm with quadrigeminy.  A/P  1 Palpitations-patient having frequent PVCs on examination. Her blood pressure is borderline. I will increase her bysystolic to 2.5 mg twice a day to see if this improves her symptoms. I have asked her to contact us if she develops dizziness or side effects. In one week we will schedule a 24-hour Holter monitor to assess frequency of PVCs. Repeat echocardiogram. Check TSH, magnesium and potassium.  2 PVCs-plan as outlined above  3 H/O anxiety-management per primary care.  Olga MillersBrian Crenshaw, MD

## 2016-07-19 ENCOUNTER — Ambulatory Visit (INDEPENDENT_AMBULATORY_CARE_PROVIDER_SITE_OTHER): Payer: 59 | Admitting: Cardiology

## 2016-07-19 ENCOUNTER — Encounter: Payer: Self-pay | Admitting: Cardiology

## 2016-07-19 VITALS — BP 99/60 | HR 73 | Ht 66.5 in | Wt 143.4 lb

## 2016-07-19 DIAGNOSIS — I493 Ventricular premature depolarization: Secondary | ICD-10-CM

## 2016-07-19 DIAGNOSIS — R002 Palpitations: Secondary | ICD-10-CM | POA: Diagnosis not present

## 2016-07-19 MED ORDER — NEBIVOLOL HCL 2.5 MG PO TABS: 2.5000 mg | ORAL_TABLET | Freq: Two times a day (BID) | ORAL | 3 refills | Status: DC

## 2016-07-19 NOTE — Patient Instructions (Signed)
Medication Instructions:   INCREASE NEBIVOLOL TO 2.5 MG TWICE DAILY  Labwork:  Your physician recommends that you HAVE LAB WORK TODAY  Testing/Procedures:  Your physician has requested that you have an echocardiogram. Echocardiography is a painless test that uses sound waves to create images of your heart. It provides your doctor with information about the size and shape of your heart and how well your heart's chambers and valves are working. This procedure takes approximately one hour. There are no restrictions for this procedure.   Your physician has recommended that you wear a 48 HOUR HOLTER holter monitor. Holter monitors are medical devices that record the heart's electrical activity. Doctors most often use these monitors to diagnose arrhythmias. Arrhythmias are problems with the speed or rhythm of the heartbeat. The monitor is a small, portable device. You can wear one while you do your normal daily activities. This is usually used to diagnose what is causing palpitations/syncope (passing out).    Follow-Up:  Your physician recommends that you schedule a follow-up appointment in: 8 WEEKS WITH DR Jens SomRENSHAW

## 2016-07-19 NOTE — Addendum Note (Signed)
Addended by: Freddi StarrMATHIS, Konstantinos Cordoba W on: 07/19/2016 03:53 PM   Modules accepted: Orders

## 2016-07-21 LAB — MAGNESIUM: MAGNESIUM: 2.3 mg/dL (ref 1.6–2.3)

## 2016-07-21 LAB — BASIC METABOLIC PANEL
BUN / CREAT RATIO: 14 (ref 9–23)
BUN: 11 mg/dL (ref 6–24)
CHLORIDE: 100 mmol/L (ref 96–106)
CO2: 24 mmol/L (ref 18–29)
Calcium: 9.1 mg/dL (ref 8.7–10.2)
Creatinine, Ser: 0.79 mg/dL (ref 0.57–1.00)
GFR calc non Af Amer: 89 mL/min/{1.73_m2} (ref >59)
GFR, EST AFRICAN AMERICAN: 102 mL/min/{1.73_m2} (ref >59)
Glucose: 93 mg/dL (ref 65–99)
Potassium: 4.6 mmol/L (ref 3.5–5.2)
Sodium: 140 mmol/L (ref 134–144)

## 2016-07-21 LAB — TSH: TSH: 4.08 u[IU]/mL (ref 0.450–4.500)

## 2016-07-25 ENCOUNTER — Other Ambulatory Visit: Payer: Self-pay | Admitting: *Deleted

## 2016-07-25 MED ORDER — AMBULATORY NON FORMULARY MEDICATION: 1 refills | Status: DC

## 2016-08-15 ENCOUNTER — Other Ambulatory Visit (HOSPITAL_COMMUNITY): Payer: 59

## 2016-08-22 ENCOUNTER — Other Ambulatory Visit (HOSPITAL_COMMUNITY): Payer: 59

## 2016-08-31 ENCOUNTER — Encounter: Payer: Self-pay | Admitting: Cardiology

## 2016-09-05 NOTE — Progress Notes (Deleted)
HPI: FU palpitations. Echo 11/14 showed normal LV systolic function. Patient has had increased palpitations since October. Mg, K and TSH normal 2/18. I increased bystolic at last ov and ordered echo and holter; not performed; since last seen,   Current Outpatient Prescriptions  Medication Sig Dispense Refill  . AMBULATORY NON FORMULARY MEDICATION Medication Name: T4 and T3 30 mcg NON PORCINE SR capsules. 90 capsule 1  . AMBULATORY NON FORMULARY MEDICATION Progesterone capsules Take four 35 mg SR progesterone capsules daily at bedtime 120 capsule 1  . citalopram (CELEXA) 40 MG tablet Take 1 tablet (40 mg total) by mouth daily. 90 tablet 3  . nebivolol (BYSTOLIC) 2.5 MG tablet Take 1 tablet (2.5 mg total) by mouth 2 (two) times daily. 180 tablet 3  . norethindrone-ethinyl estradiol (JUNEL FE,GILDESS FE,LOESTRIN FE) 1-20 MG-MCG tablet Take 1 tablet by mouth daily. 1 Package 11  . rizatriptan (MAXALT-MLT) 10 MG disintegrating tablet Take 1 tablet (10 mg total) by mouth once. May repeat in 2 hours if needed. 10 tablet 11   No current facility-administered medications for this visit.      Past Medical History:  Diagnosis Date  . Allergy   . Anxiety   . Depression   . Family history of anesthesia complication    Father had difficult time waking up   . Heart palpitations    PVC's  . Hypothyroidism 11-08   started post- partum  . Migraines   . PONV (postoperative nausea and vomiting)    Difficult waking up; had bad migraine after bunionectomy  . PVC's (premature ventricular contractions)    Pt saw Dr. Jens Som for this but pt stated it was fine    Past Surgical History:  Procedure Laterality Date  . BUNIONECTOMY Bilateral    x 2 on both feet  . CHOLECYSTECTOMY N/A 05/22/2013   Procedure: LAPAROSCOPIC CHOLECYSTECTOMY;  Surgeon: Shelly Rubenstein, MD;  Location: [facility] OR;  Service: General;  Laterality: N/A;  . essure BTL    . UPPER GI ENDOSCOPY      Social History    Social History  . Marital status: Married    Spouse name: N/A  . Number of children: 3  . Years of education: N/A   Occupational History  . Print production planner    Social History Main Topics  . Smoking status: Never Smoker  . Smokeless tobacco: Never Used  . Alcohol use No  . Drug use: No  . Sexual activity: Not on file   Other Topics Concern  . Not on file   Social History Narrative  . No narrative on file    Family History  Problem Relation Age of Onset  . Heart disease Father     Atrial fibrillation  . Atrial fibrillation Father   . Atrial fibrillation Mother   . Cancer Maternal Aunt     breast  . Cancer Paternal Aunt     non hodgkins lymphoma  . Cancer Maternal Grandmother     breast    ROS: no fevers or chills, productive cough, hemoptysis, dysphasia, odynophagia, melena, hematochezia, dysuria, hematuria, rash, seizure activity, orthopnea, PND, pedal edema, claudication. Remaining systems are negative.  Physical Exam: Well-developed well-nourished in no acute distress.  Skin is warm and dry.  HEENT is normal.  Neck is supple.  Chest is clear to auscultation with normal expansion.  Cardiovascular exam is regular rate and rhythm.  Abdominal exam nontender or distended. No masses palpated. Extremities show no edema. neuro  grossly intact  ECG- personally reviewed  A/P  1  Olga Millers, MD

## 2016-09-12 ENCOUNTER — Other Ambulatory Visit (HOSPITAL_COMMUNITY): Payer: 59

## 2016-09-13 ENCOUNTER — Ambulatory Visit: Payer: 59 | Admitting: Cardiology

## 2016-09-26 ENCOUNTER — Ambulatory Visit (INDEPENDENT_AMBULATORY_CARE_PROVIDER_SITE_OTHER): Payer: 59

## 2016-09-26 ENCOUNTER — Encounter (INDEPENDENT_AMBULATORY_CARE_PROVIDER_SITE_OTHER): Payer: Self-pay

## 2016-09-26 ENCOUNTER — Other Ambulatory Visit: Payer: Self-pay

## 2016-09-26 ENCOUNTER — Ambulatory Visit (HOSPITAL_COMMUNITY): Payer: 59 | Attending: Cardiovascular Disease

## 2016-09-26 DIAGNOSIS — I493 Ventricular premature depolarization: Secondary | ICD-10-CM

## 2016-10-09 NOTE — Progress Notes (Signed)
HPI: FU palpitations. Labs 2/18 showed normal TSH, Mg and K. Echo 4/18 showed normal LV function. Holter 4/18 showed sinus brady, sinus, sinus tach, PVCs and rare couplet. Since last seen, patient denies dyspnea or syncope. She has an occasional brief pain in her chest for seconds but no exertional chest pain. She has continuing palpitations described as a skip but they are improved compared to previous.  Current Outpatient Prescriptions  Medication Sig Dispense Refill  . AMBULATORY NON FORMULARY MEDICATION Medication Name: T4 and T3 30 mcg NON PORCINE SR capsules. 90 capsule 1  . AMBULATORY NON FORMULARY MEDICATION Progesterone capsules Take four 35 mg SR progesterone capsules daily at bedtime 120 capsule 1  . citalopram (CELEXA) 40 MG tablet Take 1 tablet (40 mg total) by mouth daily. 90 tablet 3  . nebivolol (BYSTOLIC) 2.5 MG tablet Take 1 tablet (2.5 mg total) by mouth 2 (two) times daily. 180 tablet 3  . norethindrone-ethinyl estradiol (JUNEL FE,GILDESS FE,LOESTRIN FE) 1-20 MG-MCG tablet Take 1 tablet by mouth daily. 1 Package 11  . rizatriptan (MAXALT-MLT) 10 MG disintegrating tablet Take 1 tablet (10 mg total) by mouth once. May repeat in 2 hours if needed. 10 tablet 11   No current facility-administered medications for this visit.      Past Medical History:  Diagnosis Date  . Allergy   . Anxiety   . Depression   . Family history of anesthesia complication    Father had difficult time waking up   . Heart palpitations    PVC's  . Hypothyroidism 11-08   started post- partum  . Migraines   . PONV (postoperative nausea and vomiting)    Difficult waking up; had bad migraine after bunionectomy  . PVC's (premature ventricular contractions)    Pt saw Dr. Jens Som for this but pt stated it was fine    Past Surgical History:  Procedure Laterality Date  . BUNIONECTOMY Bilateral    x 2 on both feet  . CHOLECYSTECTOMY N/A 05/22/2013   Procedure: LAPAROSCOPIC  CHOLECYSTECTOMY;  Surgeon: Shelly Rubenstein, MD;  Location: [facility] OR;  Service: General;  Laterality: N/A;  . essure BTL    . UPPER GI ENDOSCOPY      Social History   Social History  . Marital status: Married    Spouse name: N/A  . Number of children: 3  . Years of education: N/A   Occupational History  . Print production planner    Social History Main Topics  . Smoking status: Never Smoker  . Smokeless tobacco: Never Used  . Alcohol use No  . Drug use: No  . Sexual activity: Not on file   Other Topics Concern  . Not on file   Social History Narrative  . No narrative on file    Family History  Problem Relation Age of Onset  . Heart disease Father     Atrial fibrillation  . Atrial fibrillation Father   . Atrial fibrillation Mother   . Cancer Maternal Aunt     breast  . Cancer Paternal Aunt     non hodgkins lymphoma  . Cancer Maternal Grandmother     breast    ROS: no fevers or chills, productive cough, hemoptysis, dysphasia, odynophagia, melena, hematochezia, dysuria, hematuria, rash, seizure activity, orthopnea, PND, pedal edema, claudication. Remaining systems are negative.  Physical Exam: Well-developed well-nourished in no acute distress.  Skin is warm and dry.  HEENT is normal.  Neck is supple.  Chest is  clear to auscultation with normal expansion.  Cardiovascular exam is irregular Abdominal exam nontender or distended. No masses palpated. Extremities show no edema. neuro grossly intact  ECG- Sinus rhythm with ventricular bigeminy. Nonspecific ST changes. personally reviewed  A/P  1 palpitations-patient continues to have PVCs and palpitations but her symptoms have improved. She does have some fatigue. Her systolic blood pressure typically runs 90. I therefore am hesitant to advance her beta blocker. We will continue present dose of bystolic and she will take an additional 2.5 mg daily as needed. If her symptoms persist we could ask one of our electrophysiologists  to review. She might need an antiarrhythmic such as flecainide in the future.  2 PVCs-plan as outlined above.  3 history of anxiety-management per primary care.     Sydney MillersBrian Lorane Cousar, MD

## 2016-10-11 ENCOUNTER — Encounter: Payer: Self-pay | Admitting: Cardiology

## 2016-10-11 ENCOUNTER — Ambulatory Visit (INDEPENDENT_AMBULATORY_CARE_PROVIDER_SITE_OTHER): Payer: 59 | Admitting: Cardiology

## 2016-10-11 VITALS — BP 118/64 | HR 71 | Ht 66.5 in | Wt 147.0 lb

## 2016-10-11 DIAGNOSIS — I493 Ventricular premature depolarization: Secondary | ICD-10-CM

## 2016-10-11 DIAGNOSIS — R002 Palpitations: Secondary | ICD-10-CM | POA: Diagnosis not present

## 2016-10-11 MED ORDER — NEBIVOLOL HCL 2.5 MG PO TABS: 2.5000 mg | ORAL_TABLET | Freq: Two times a day (BID) | ORAL | 3 refills | Status: DC

## 2016-10-11 NOTE — Patient Instructions (Signed)
Your physician wants you to follow-up in: 6 MONTHS WITH DR CRENSHAW You will receive a reminder letter in the mail two months in advance. If you don't receive a letter, please call our office to schedule the follow-up appointment.   If you need a refill on your cardiac medications before your next appointment, please call your pharmacy.  

## 2017-01-08 LAB — HM PAP SMEAR: HM Pap smear: NEGATIVE

## 2017-03-13 ENCOUNTER — Telehealth: Payer: Self-pay | Admitting: Physician Assistant

## 2017-03-13 DIAGNOSIS — N951 Menopausal and female climacteric states: Secondary | ICD-10-CM

## 2017-03-13 DIAGNOSIS — E039 Hypothyroidism, unspecified: Secondary | ICD-10-CM

## 2017-03-13 DIAGNOSIS — R7989 Other specified abnormal findings of blood chemistry: Secondary | ICD-10-CM

## 2017-03-13 NOTE — Telephone Encounter (Signed)
Printed labs. Ok to get drawn.

## 2017-03-13 NOTE — Telephone Encounter (Signed)
Patient is requesting to get lab order for thyroid and progesterone levels check'd to see if she has reached menopause has stopped taking birth control that were helping with headaches so she would be able to get clear results from labs. Pt would like a call back when labs are entered so she can go down. [phone]. Thanks

## 2017-03-17 ENCOUNTER — Other Ambulatory Visit: Payer: Self-pay | Admitting: Physician Assistant

## 2017-03-27 ENCOUNTER — Telehealth: Payer: Self-pay | Admitting: Physician Assistant

## 2017-03-27 ENCOUNTER — Ambulatory Visit (INDEPENDENT_AMBULATORY_CARE_PROVIDER_SITE_OTHER): Payer: 59 | Admitting: Physician Assistant

## 2017-03-27 ENCOUNTER — Encounter: Payer: Self-pay | Admitting: Physician Assistant

## 2017-03-27 VITALS — BP 105/61 | HR 56 | Ht 66.5 in | Wt 152.0 lb

## 2017-03-27 DIAGNOSIS — I493 Ventricular premature depolarization: Secondary | ICD-10-CM | POA: Diagnosis not present

## 2017-03-27 DIAGNOSIS — H6991 Unspecified Eustachian tube disorder, right ear: Secondary | ICD-10-CM | POA: Insufficient documentation

## 2017-03-27 DIAGNOSIS — E039 Hypothyroidism, unspecified: Secondary | ICD-10-CM | POA: Diagnosis not present

## 2017-03-27 DIAGNOSIS — F3342 Major depressive disorder, recurrent, in full remission: Secondary | ICD-10-CM

## 2017-03-27 DIAGNOSIS — Z780 Asymptomatic menopausal state: Secondary | ICD-10-CM | POA: Diagnosis not present

## 2017-03-27 DIAGNOSIS — Z23 Encounter for immunization: Secondary | ICD-10-CM | POA: Diagnosis not present

## 2017-03-27 DIAGNOSIS — H6981 Other specified disorders of Eustachian tube, right ear: Secondary | ICD-10-CM | POA: Diagnosis not present

## 2017-03-27 MED ORDER — PREDNISONE 50 MG PO TABS: ORAL_TABLET | ORAL | 0 refills | Status: DC

## 2017-03-27 MED ORDER — CITALOPRAM HYDROBROMIDE 40 MG PO TABS: 40.0000 mg | ORAL_TABLET | Freq: Every day | ORAL | 3 refills | Status: DC

## 2017-03-27 MED ORDER — AMBULATORY NON FORMULARY MEDICATION: 1 refills | Status: DC

## 2017-03-27 NOTE — Telephone Encounter (Signed)
Free T4 and T3 normal range.

## 2017-03-27 NOTE — Patient Instructions (Addendum)
Menopause and Herbal Products What is menopause? Menopause is the normal time of life when menstrual periods decrease in frequency and eventually stop completely. This process can take several years for some women. Menopause is complete when you have had an absence of menstruation for a full year since your last menstrual period. It usually occurs between the ages of 48 and 55. It is not common for menopause to begin before the age of 40. During menopause, your body stops producing the female hormones estrogen and progesterone. Common symptoms associated with this loss of hormones (vasomotor symptoms) are:  Hot flashes.  Hot flushes.  Night sweats.  Other common symptoms and complications of menopause include:  Decrease in sex drive.  Vaginal dryness and thinning of the walls of the vagina. This can make sex painful.  Dryness of the skin and development of wrinkles.  Headaches.  Tiredness.  Irritability.  Memory problems.  Weight gain.  Bladder infections.  Hair growth on the face and chest.  Inability to reproduce offspring (infertility).  Loss of density in the bones (osteoporosis) increasing your risk for breaks (fractures).  Depression.  Hardening and narrowing of the arteries (atherosclerosis). This increases your risk of heart attack and stroke.  What treatment options are available? There are many treatment choices for menopause symptoms. The most common treatment is hormone replacement therapy. Many alternative therapies for menopause are emerging, including the use of herbal products. These supplements can be found in the form of herbs, teas, oils, tinctures, and pills. Common herbal supplements for menopause are made from plants that contain phytoestrogens. Phytoestrogens are compounds that occur naturally in plants and plant products. They act like estrogen in the body. Foods and herbs that contain phytoestrogens include:  Soy.  Flax seeds.  Red  clover.  Ginseng.  What menopause symptoms may be helped if I use herbal products?  Vasomotor symptoms. These may be helped by: ? Soy. Some studies show that soy may have a moderate benefit for hot flashes. ? Black cohosh. There is limited evidence indicating this may be beneficial for hot flashes.  Symptoms that are related to heart and blood vessel disease. These may be helped by soy. Studies have shown that soy can help to lower cholesterol.  Depression. This may be helped by: ? St. John's wort. There is limited evidence that shows this may help mild to moderate depression. ? Black cohosh. There is evidence that this may help depression and mood swings.  Osteoporosis. Soy may help to decrease bone loss that is associated with menopause and may prevent osteoporosis. Limited evidence indicates that red clover may offer some bone loss protection as well. Other herbal products that are commonly used during menopause lack enough evidence to support their use as a replacement for conventional menopause therapies. These products include evening primrose, ginseng, and red clover. What are the cases when herbal products should not be used during menopause? Do not use herbal products during menopause without your health care provider's approval if:  You are taking medicine.  You have a preexisting liver condition.  Are there any risks in my taking herbal products during menopause? If you choose to use herbal products to help with symptoms of menopause, keep in mind that:  Different supplements have different and unmeasured amounts of herbal ingredients.  Herbal products are not regulated the same way that medicines are.  Concentrations of herbs may vary depending on the way they are prepared. For example, the concentration may be different in a pill,   tea, oil, and tincture.  Little is known about the risks of using herbal products, particularly the risks of long-term use.  Some herbal  supplements can be harmful when combined with certain medicines.  Most commonly reported side effects of herbal products are mild. However, if used improperly, many herbal supplements can cause serious problems. Talk to your health care provider before starting any herbal product. If problems develop, stop taking the supplement and let your health care provider know. This information is not intended to replace advice given to you by your health care provider. Make sure you discuss any questions you have with your health care provider. Document Released: 11/08/2007 Document Revised: 04/18/2016 Document Reviewed: 11/04/2013 Elsevier Interactive Patient Education  2017 Elsevier Inc.  Eustachian Tube Dysfunction The eustachian tube connects the middle ear to the back of the nose. It regulates air pressure in the middle ear by allowing air to move between the ear and nose. It also helps to drain fluid from the middle ear space. When the eustachian tube does not function properly, air pressure, fluid, or both can build up in the middle ear. Eustachian tube dysfunction can affect one or both ears. What are the causes? This condition happens when the eustachian tube becomes blocked or cannot open normally. This may result from:  Ear infections.  Colds and other upper respiratory infections.  Allergies.  Irritation, such as from cigarette smoke or acid from the stomach coming up into the esophagus (gastroesophageal reflux).  Sudden changes in air pressure, such as from descending in an airplane.  Abnormal growths in the nose or throat, such as nasal polyps, tumors, or enlarged tissue at the back of the throat (adenoids).  What increases the risk? This condition may be more likely to develop in people who smoke and people who are overweight. Eustachian tube dysfunction may also be more likely to develop in children, especially children who have:  Certain birth defects of the mouth, such as cleft  palate.  Large tonsils and adenoids.  What are the signs or symptoms? Symptoms of this condition may include:  A feeling of fullness in the ear.  Ear pain.  Clicking or popping noises in the ear.  Ringing in the ear.  Hearing loss.  Loss of balance.  Symptoms may get worse when the air pressure around you changes, such as when you travel to an area of high elevation or fly on an airplane. How is this diagnosed? This condition may be diagnosed based on:  Your symptoms.  A physical exam of your ear, nose, and throat.  Tests, such as those that measure: ? The movement of your eardrum (tympanogram). ? Your hearing (audiometry).  How is this treated? Treatment depends on the cause and severity of your condition. If your symptoms are mild, you may be able to relieve your symptoms by moving air into ("popping") your ears. If you have symptoms of fluid in your ears, treatment may include:  Decongestants.  Antihistamines.  Nasal sprays or ear drops that contain medicines that reduce swelling (steroids).  In some cases, you may need to have a procedure to drain the fluid in your eardrum (myringotomy). In this procedure, a small tube is placed in the eardrum to:  Drain the fluid.  Restore the air in the middle ear space.  Follow these instructions at home:  Take over-the-counter and prescription medicines only as told by your health care provider.  Use techniques to help pop your ears as recommended by your health  care provider. These may include: ? Chewing gum. ? Yawning. ? Frequent, forceful swallowing. ? Closing your mouth, holding your nose closed, and gently blowing as if you are trying to blow air out of your nose.  Do not do any of the following until your health care provider approves: ? Travel to high altitudes. ? Fly in airplanes. ? Work in a Estate agentpressurized cabin or room. ? Scuba dive.  Keep your ears dry. Dry your ears completely after showering or  bathing.  Do not smoke.  Keep all follow-up visits as told by your health care provider. This is important. Contact a health care provider if:  Your symptoms do not go away after treatment.  Your symptoms come back after treatment.  You are unable to pop your ears.  You have: ? A fever. ? Pain in your ear. ? Pain in your head or neck. ? Fluid draining from your ear.  Your hearing suddenly changes.  You become very dizzy.  You lose your balance. This information is not intended to replace advice given to you by your health care provider. Make sure you discuss any questions you have with your health care provider. Document Released: 06/18/2015 Document Revised: 10/28/2015 Document Reviewed: 06/10/2014 Elsevier Interactive Patient Education  [employer]2018 Elsevier Inc.

## 2017-03-27 NOTE — Telephone Encounter (Signed)
Can we please send by 10/23 note to Dr. Marcelle [facility]lieHolland at [facility] at patient's request.

## 2017-03-27 NOTE — Progress Notes (Signed)
Subjective:    Patient ID: Sydney Bailey, female    DOB: July 09, 1965, 51 y.o.   MRN: [id number]  HPI  Pt is a 51 yo female who presents to the clinic for follow up and to go over labs.   Hypothyroidism- labs just checked and stable and look good. Pt has no compliants. [facility]   On sept 8th she stopped OCP and progesterone to get her FSH/progesterone/estradiol checked. Labs confirmed she is in menopause. She would like to discuss options. She does overall feel warmer than others but hot flashes are not too bad. No vaginal dryness. Her mood is controlled with celexa.   Dr. Jens Somrenshaw manages palpitations and recently increased bystolic to twice a day. She is tolerating well with no dizziness. She does feels like it helps palpitations.   Pt has right ear pain off and on. Denies any fever, chills, sinus pressure, SOB or wheezing. No ear discharge. Not tried anything to make better.   .. Active Ambulatory Problems    Diagnosis Date Noted  . Hypothyroidism 11/22/2007  . PANIC ATTACK 10/02/2008  . Migraine without aura 11/22/2007  . MENORRHAGIA 07/27/2008  . Palpitations 03/19/2013  . Dyspnea 03/19/2013  . Depression 08/11/2014  . Ventricular quadrigeminy 04/21/2016  . PVC's (premature ventricular contractions) 04/21/2016  . Low serum progesterone 06/09/2016  . Menopause 03/27/2017  . ETD (Eustachian tube dysfunction), right 03/27/2017   Resolved Ambulatory Problems    Diagnosis Date Noted  . SYNCOPE 02/20/2008  . POSTURAL LIGHTHEADEDNESS 12/25/2007  . FATIGUE 07/27/2008   Past Medical History:  Diagnosis Date  . Allergy   . Anxiety   . Depression   . Family history of anesthesia complication   . Heart palpitations   . Hypothyroidism 11-08  . Migraines   . PONV (postoperative nausea and vomiting)   . PVC's (premature ventricular contractions)       Review of Systems  All other systems reviewed and are negative.      Objective:   Physical  Exam  Constitutional: She is oriented to person, place, and time. She appears well-developed and well-nourished.  HENT:  Head: Normocephalic and atraumatic.  Right Ear: External ear normal.  Left Ear: External ear normal.  Nose: Nose normal.  Mouth/Throat: Oropharynx is clear and moist. No oropharyngeal exudate.  TM's clear bilaterally.   Neck: No thyromegaly present.  Cardiovascular: Normal rate, regular rhythm and normal heart sounds.   Pulmonary/Chest: Effort normal and breath sounds normal.  Neurological: She is alert and oriented to person, place, and time.  Psychiatric: She has a normal mood and affect. Her behavior is normal.          Assessment & Plan:  Marland Kitchen.Marland Kitchen.Sydney Bailey was seen today for hypothyroidism.  Diagnoses and all orders for this visit:  Menopause  Influenza vaccine needed -     Flu Vaccine QUAD 6+ mos PF IM (Fluarix Quad PF)  ETD (Eustachian tube dysfunction), right -     predniSONE (DELTASONE) 50 MG tablet; Take one tablet for 5 days.  Hypothyroidism, unspecified type -     AMBULATORY NON FORMULARY MEDICATION; Medication Name: T4 120mcg and T3 30 mcg NON PORCINE SR capsules.  Need for Tdap vaccination -     Tdap vaccine greater than or equal to 7yo IM  Ventricular quadrigeminy  Recurrent major depressive disorder, in full remission (HCC) -     citalopram (CELEXA) 40 MG tablet; Take 1 tablet (40 mg total) by mouth daily.  .. Depression screen  PHQ 2/9 03/27/2017  Decreased Interest 0  Down, Depressed, Hopeless 0  PHQ - 2 Score 0   Dr. Jens Som manages bystolic.   Refilled thyroid medication. Follow up in 6 months.   celexa refilled for one year.   Discussed menopause that are confirmed by labs. Will add testosterone to check. Discussed HRT and risk. Pt declines today. Given HO for herbal remedies. Pt will consider going back on progesterone and consider salvia testing from [facility] if she is symptomatic with menopausal symptoms. At this time she  feels good.   Discussed ETD.reassured no signs of infection.  HO given. Start flonase 2 sprays each nostril daily. Add prednisone for 5 days if symptoms not improving. Follow up as needed.   Marland Kitchen.Spent 30 minutes with patient and greater than 50 percent of visit spent counseling patient regarding treatment plan.

## 2017-03-27 NOTE — Telephone Encounter (Signed)
Please add free t3 and t4.

## 2017-03-27 NOTE — Telephone Encounter (Signed)
Please add testosterone.

## 2017-03-28 LAB — FOLLICLE STIMULATING HORMONE: FSH: 46 m[IU]/mL

## 2017-03-28 LAB — TEST AUTHORIZATION

## 2017-03-28 LAB — TESTOSTERONE: TESTOSTERONE: 29 ng/dL

## 2017-03-28 LAB — ESTRADIOL: Estradiol: 49 pg/mL

## 2017-03-28 LAB — T4, FREE: Free T4: 0.9 ng/dL (ref 0.8–1.8)

## 2017-03-28 LAB — TEST AUTHORIZATION 2

## 2017-03-28 LAB — TSH: TSH: 1.85 m[IU]/L

## 2017-03-28 LAB — PROGESTERONE: Progesterone: <0.5 ng/mL

## 2017-03-28 LAB — T3, FREE: T3, Free: 3.2 pg/mL (ref 2.3–4.2)

## 2017-03-28 NOTE — Telephone Encounter (Signed)
Testosterone in normal range.

## 2017-05-23 ENCOUNTER — Other Ambulatory Visit: Payer: Self-pay | Admitting: Physician Assistant

## 2017-05-25 ENCOUNTER — Other Ambulatory Visit: Payer: Self-pay | Admitting: *Deleted

## 2017-06-20 NOTE — Progress Notes (Signed)
HPI: FU palpitations. Labs 2/18 showed normal TSH, Mg and K. Echo 4/18 showed normal LV function. Holter 4/18 showed sinus brady, sinus, sinus tach, PVCs and rare couplet. Since last seen,  she has had occasional palpitations in the past 2 weeks but otherwise reasonably well controlled.  No significant dyspnea on exertion.  No chest pain or syncope.  Current Outpatient Medications  Medication Sig Dispense Refill  . AMBULATORY NON FORMULARY MEDICATION Progesterone capsules Take four 35 mg SR progesterone capsules daily at bedtime 120 capsule 1  . AMBULATORY NON FORMULARY MEDICATION Medication Name: T4 and T3 30 mcg NON PORCINE SR capsules. 90 capsule 1  . citalopram (CELEXA) 40 MG tablet Take 1 tablet (40 mg total) by mouth daily. 90 tablet 3  . nebivolol (BYSTOLIC) 2.5 MG tablet Take 1 tablet (2.5 mg total) by mouth 2 (two) times daily. 180 tablet 3   No current facility-administered medications for this visit.      Past Medical History:  Diagnosis Date  . Allergy   . Anxiety   . Depression   . Family history of anesthesia complication    Father had difficult time waking up   . Heart palpitations    PVC's  . Hypothyroidism 11-08   started post- partum  . Migraines   . PONV (postoperative nausea and vomiting)    Difficult waking up; had bad migraine after bunionectomy  . PVC's (premature ventricular contractions)    Pt saw Dr. Jens Som for this but pt stated it was fine    Past Surgical History:  Procedure Laterality Date  . BUNIONECTOMY Bilateral    x 2 on both feet  . CHOLECYSTECTOMY N/A 05/22/2013   Procedure: LAPAROSCOPIC CHOLECYSTECTOMY;  Surgeon: Shelly Rubenstein, MD;  Location: [facility] OR;  Service: General;  Laterality: N/A;  . essure BTL    . UPPER GI ENDOSCOPY      Social History   Socioeconomic History  . Marital status: Married    Spouse name: Not on file  . Number of children: 3  . Years of education: Not on file  . Highest education level:  Not on file  Social Needs  . Financial resource strain: Not on file  . Food insecurity - worry: Not on file  . Food insecurity - inability: Not on file  . Transportation needs - medical: Not on file  . Transportation needs - non-medical: Not on file  Occupational History  . Occupation: Print production planner  Tobacco Use  . Smoking status: Never Smoker  . Smokeless tobacco: Never Used  Substance and Sexual Activity  . Alcohol use: No  . Drug use: No  . Sexual activity: Not on file  Other Topics Concern  . Not on file  Social History Narrative  . Not on file    Family History  Problem Relation Age of Onset  . Heart disease Father        Atrial fibrillation  . Atrial fibrillation Father   . Atrial fibrillation Mother   . Cancer Maternal Aunt        breast  . Cancer Paternal Aunt        non hodgkins lymphoma  . Cancer Maternal Grandmother        breast    ROS: no fevers or chills, productive cough, hemoptysis, dysphasia, odynophagia, melena, hematochezia, dysuria, hematuria, rash, seizure activity, orthopnea, PND, pedal edema, claudication. Remaining systems are negative.  Physical Exam: Well-developed well-nourished in no acute distress.  Skin is  warm and dry.  HEENT is normal.  Neck is supple.  Chest is clear to auscultation with normal expansion.  Cardiovascular exam is irregular Abdominal exam nontender or distended. No masses palpated. Extremities show no edema. neuro grossly intact  Electrocardiogram showed sinus rhythm at a rate of 71.  Occasional PVC.  No ST changes. A/P  1 palpitations-symptoms seem reasonably well controlled.  Continue bysystolic.   2 PVCs-continue beta-blocker as outlined above.  3 anxiety-management per primary care.  Olga MillersBrian Greig Altergott, MD

## 2017-06-27 ENCOUNTER — Ambulatory Visit: Payer: 59 | Admitting: Cardiology

## 2017-06-27 ENCOUNTER — Encounter: Payer: Self-pay | Admitting: Cardiology

## 2017-06-27 VITALS — BP 102/62 | HR 71 | Ht 66.5 in | Wt 154.0 lb

## 2017-06-27 DIAGNOSIS — R002 Palpitations: Secondary | ICD-10-CM

## 2017-06-27 DIAGNOSIS — I493 Ventricular premature depolarization: Secondary | ICD-10-CM | POA: Diagnosis not present

## 2017-06-27 MED ORDER — NEBIVOLOL HCL 2.5 MG PO TABS: 2.5000 mg | ORAL_TABLET | Freq: Two times a day (BID) | ORAL | 3 refills | Status: DC

## 2017-06-27 NOTE — Patient Instructions (Signed)
Your physician wants you to follow-up in: ONE YEAR WITH DR CRENSHAW You will receive a reminder letter in the mail two months in advance. If you don't receive a letter, please call our office to schedule the follow-up appointment.   If you need a refill on your cardiac medications before your next appointment, please call your pharmacy.  

## 2017-07-30 ENCOUNTER — Telehealth: Payer: Self-pay | Admitting: Emergency Medicine

## 2017-07-30 ENCOUNTER — Telehealth: Payer: Self-pay | Admitting: *Deleted

## 2017-07-30 ENCOUNTER — Other Ambulatory Visit: Payer: Self-pay | Admitting: Physician Assistant

## 2017-07-30 MED ORDER — ATOVAQUONE-PROGUANIL HCL 250-100 MG PO TABS: 1.0000 | ORAL_TABLET | Freq: Every day | ORAL | 0 refills | Status: DC

## 2017-07-30 NOTE — Telephone Encounter (Signed)
Patient would like to have the same script given to her husband for the trip since they already know it will be covered by their insurance. Please call in Atovquone-proquanil to [facility].

## 2017-07-30 NOTE — Telephone Encounter (Signed)
Ok would she like doxycycline which is cheapest option but you do have to take 4 weeks after return or primaquine which is a little more expensive but take 2 days before travel and 7 days after return.

## 2017-07-30 NOTE — Telephone Encounter (Signed)
Left message for patient to call and give us her decision on which anti-malarial rx she would like per Breeback's note offering doxycycline or primaquine (including differences in dosing).

## 2017-07-30 NOTE — Telephone Encounter (Signed)
Pt left vm stating that she is traveling to [place] early next month and needs an rx for Malaria.  Please advise.

## 2017-07-30 NOTE — Telephone Encounter (Signed)
sent 

## 2017-07-30 NOTE — Telephone Encounter (Signed)
She stated that there are maybe 3 that the CDC recommend & that some are resistant.

## 2017-10-03 ENCOUNTER — Telehealth: Payer: Self-pay

## 2017-10-03 MED ORDER — MECLIZINE HCL 25 MG PO TABS: 25.0000 mg | ORAL_TABLET | Freq: Three times a day (TID) | ORAL | 0 refills | Status: DC | PRN

## 2017-10-03 NOTE — Telephone Encounter (Signed)
[facility] called and complains of vertigo. She started with dizziness and nausea over the weekend. She did the Epley maneuver and it helped and she was able to go to work yesterday. It is back this morning with dizziness, nausea and a headache. I did advise her to drink plenty of fluids and she can take up to 800 mg of ibuprofen as lone as she is not allergic. She is unable to come in due to the dizziness. She wanted to know if there is a medication that could be called to [facility].

## 2017-10-03 NOTE — Telephone Encounter (Signed)
Ok for antivert  up to three times a day. #60. Continue to do epley 3 rounds three times a day.

## 2017-10-03 NOTE — Telephone Encounter (Signed)
Medication sent and patient advised. 

## 2017-10-12 ENCOUNTER — Ambulatory Visit (INDEPENDENT_AMBULATORY_CARE_PROVIDER_SITE_OTHER): Payer: 59 | Admitting: Physician Assistant

## 2017-10-12 ENCOUNTER — Encounter: Payer: Self-pay | Admitting: Physician Assistant

## 2017-10-12 VITALS — BP 94/49 | HR 57 | Ht 66.5 in | Wt 154.0 lb

## 2017-10-12 DIAGNOSIS — R5383 Other fatigue: Secondary | ICD-10-CM

## 2017-10-12 DIAGNOSIS — E039 Hypothyroidism, unspecified: Secondary | ICD-10-CM

## 2017-10-12 DIAGNOSIS — R42 Dizziness and giddiness: Secondary | ICD-10-CM | POA: Diagnosis not present

## 2017-10-12 MED ORDER — AMBULATORY NON FORMULARY MEDICATION: 1 refills | Status: DC

## 2017-10-12 MED ORDER — METHYLPREDNISOLONE 4 MG PO TBPK: ORAL_TABLET | ORAL | 0 refills | Status: DC

## 2017-10-12 NOTE — Progress Notes (Signed)
Subjective:    Patient ID: Sydney Bailey, female    DOB: 09-Nov-1965, 52 y.o.   MRN: [id number]  HPI  Pt is a 52 yo female with PVC's who has a PMH of vertigo who presents to the clinic with new episode that has lasted for 12 days. She is much better but continues to have problems with positional changes. She woke up suddenly and felt dizzy and nauseated. She has not vomited. She also had a dull headache that will not go away. It is located on the top of her head. She intermittently has left ear pain. No fever, chills, sinus pressure. She has had a similar episode a few times the last being in September. epley maneuver performed and dizzy went away. This time she is doing epley but dizziness does not resolve. Given meclizine but just makes her PVCs worse. She has missed a few days off work due to dizziness. No recent triggers, travel, sickness.   She seems more tired than normal wants to make sure labs look ok.   .. Active Ambulatory Problems    Diagnosis Date Noted  . Hypothyroidism 11/22/2007  . PANIC ATTACK 10/02/2008  . Migraine without aura 11/22/2007  . MENORRHAGIA 07/27/2008  . Palpitations 03/19/2013  . Dyspnea 03/19/2013  . Depression 08/11/2014  . Ventricular quadrigeminy 04/21/2016  . PVC's (premature ventricular contractions) 04/21/2016  . Low serum progesterone 06/09/2016  . Menopause 03/27/2017  . ETD (Eustachian tube dysfunction), right 03/27/2017  . No energy 10/14/2017  . Vertigo 10/14/2017   Resolved Ambulatory Problems    Diagnosis Date Noted  . SYNCOPE 02/20/2008  . POSTURAL LIGHTHEADEDNESS 12/25/2007  . FATIGUE 07/27/2008   Past Medical History:  Diagnosis Date  . Allergy   . Anxiety   . Depression   . Family history of anesthesia complication   . Heart palpitations   . Hypothyroidism 11-08  . Migraines   . PONV (postoperative nausea and vomiting)   . PVC's (premature ventricular contractions)       Review of Systems See HPI.     Objective:    Physical Exam  Constitutional: She is oriented to person, place, and time. She appears well-developed and well-nourished.  HENT:  Head: Normocephalic and atraumatic.  Right Ear: External ear normal.  Left Ear: External ear normal.  Nose: Nose normal.  Mouth/Throat: Oropharynx is clear and moist. No oropharyngeal exudate.  TM"s clear bilaterally. Perhaps a little retracted in the left TM.  Negative for any sinus pressure.   Eyes: Pupils are equal, round, and reactive to light. Conjunctivae and EOM are normal.  Neck: Normal range of motion. Neck supple.  Cardiovascular: Normal rate and regular rhythm.  Pulmonary/Chest: Effort normal and breath sounds normal.  Lymphadenopathy:    She has no cervical adenopathy.  Neurological: She is alert and oriented to person, place, and time.  dixhallpike negative for nystagmus but positive for positional dizziness worse to the left.  No ataxia.   Psychiatric: She has a normal mood and affect. Her behavior is normal.          Assessment & Plan:  Marland KitchenMarland KitchenDiagnoses and all orders for this visit:  Vertigo -     methylPREDNISolone (MEDROL DOSEPAK) 4 MG TBPK tablet; Take as directed by package -     AMBULATORY NON FORMULARY MEDICATION; Medication Name: T4 and T3 30 mcg NON PORCINE SR capsules. -     Ambulatory referral to Physical Therapy  Acquired hypothyroidism -     TSH -  T3 -     T4  No energy -     TSH -     T3 -     T4 -     Vitamin D 1,25 dihydroxy -     B12 and Folate Panel   .Marland Kitchen Depression screen [facility] 2/9 10/12/2017 03/27/2017  Decreased Interest 1 0  Down, Depressed, Hopeless 0 0  PHQ - 2 Score 1 0  Altered sleeping 2 -  Tired, decreased energy 2 -  Change in appetite 2 -  Feeling bad or failure about yourself  0 -  Trouble concentrating 1 -  Moving slowly or fidgety/restless 0 -  Suicidal thoughts 0 -  PHQ-9 Score 8 -  Difficult doing work/chores Somewhat difficult -   Recheck labs. At this point she needs to  go to vestibular rehab sounds most consistent with BPPV but dixhallpike did not show nystagmus but did create dizziness. Continue to do epley manevuers. No sign of sinus infection  She is having some ear pain like ETD. Will start medrol dose pak. Start flonase. No red flag and with hx no need for MRI today but if headache does not resolve or worsen or vision changes may need to reconsider.   Time to recheck thyroid and run labs on fatigue.   Marland Kitchen.Spent 30 minutes with patient and greater than 50 percent of visit spent counseling patient regarding treatment plan.

## 2017-10-12 NOTE — Patient Instructions (Signed)

## 2017-10-14 ENCOUNTER — Encounter: Payer: Self-pay | Admitting: Physician Assistant

## 2017-10-14 DIAGNOSIS — R42 Dizziness and giddiness: Secondary | ICD-10-CM | POA: Insufficient documentation

## 2017-10-14 DIAGNOSIS — R5383 Other fatigue: Secondary | ICD-10-CM | POA: Insufficient documentation

## 2017-10-15 ENCOUNTER — Telehealth: Payer: Self-pay

## 2017-10-15 DIAGNOSIS — E039 Hypothyroidism, unspecified: Secondary | ICD-10-CM

## 2017-10-15 MED ORDER — AMBULATORY NON FORMULARY MEDICATION: 1 refills | Status: DC

## 2017-10-15 NOTE — Telephone Encounter (Signed)
Ok sent new dose to pharmacy. Labs only in 4-6 weeks.

## 2017-10-15 NOTE — Progress Notes (Signed)
Call pt: TSH is elevated. We should increase thyroid medication. Where do you want it sent to?

## 2017-10-15 NOTE — Telephone Encounter (Signed)
Pt states that she would rather just go ahead and make the changes that you wanted to. W.Tyria Springer, CCMA

## 2017-10-15 NOTE — Telephone Encounter (Signed)
Your TSH was elevated meaning we should increase your thyroid hormone;however if just picked up that dose and want to see if normalizes itself we can wait and recheck in a few months and then make adjustment. Thyroid is a hormone so there can be some variable readings.

## 2017-10-17 LAB — VITAMIN D 1,25 DIHYDROXY
VITAMIN D 1, 25 (OH) TOTAL: 21 pg/mL (ref 18–72)
VITAMIN D3 1, 25 (OH): 21 pg/mL
Vitamin D2 1, 25 (OH)2: <8 pg/mL

## 2017-10-17 LAB — TSH: TSH: 5 m[IU]/L — AB

## 2017-10-17 LAB — B12 AND FOLATE PANEL
Folate: 11.8 ng/mL
Vitamin B-12: 567 pg/mL (ref 200–1100)

## 2017-10-17 LAB — T3: T3, Total: 128 ng/dL (ref 76–181)

## 2017-10-17 LAB — T4: T4 TOTAL: 6 ug/dL (ref 5.1–11.9)

## 2017-10-17 NOTE — Progress Notes (Signed)
Vitamin D low D3  daily.

## 2018-04-18 ENCOUNTER — Other Ambulatory Visit: Payer: Self-pay | Admitting: Physician Assistant

## 2018-04-18 DIAGNOSIS — E039 Hypothyroidism, unspecified: Secondary | ICD-10-CM

## 2018-04-18 DIAGNOSIS — F3342 Major depressive disorder, recurrent, in full remission: Secondary | ICD-10-CM

## 2018-04-18 DIAGNOSIS — Z0000 Encounter for general adult medical examination without abnormal findings: Secondary | ICD-10-CM

## 2018-04-19 NOTE — Telephone Encounter (Signed)
..  Call patient: We do not have up to date mammogram. If they have had done please get record. If they have not gotten on ok to order and encourage to get it.   Pt needs CPE.

## 2018-04-25 NOTE — Telephone Encounter (Signed)
Pt had Pap and Mammogram done at [facility]. Records requested.  Had flu shot this month at [facility], chart updated.

## 2018-04-25 NOTE — Telephone Encounter (Signed)
Pt advised and scheduled for annual exam. Labs pended for completion prior to appt.

## 2018-04-30 ENCOUNTER — Other Ambulatory Visit: Payer: Self-pay

## 2018-04-30 DIAGNOSIS — Z0000 Encounter for general adult medical examination without abnormal findings: Secondary | ICD-10-CM

## 2018-04-30 DIAGNOSIS — E039 Hypothyroidism, unspecified: Secondary | ICD-10-CM

## 2018-04-30 LAB — LIPID PANEL
CHOL/HDL RATIO: 4.8 (calc) (ref <5.0)
CHOLESTEROL: 239 mg/dL — AB (ref <200)
HDL: 50 mg/dL — AB (ref >50)
LDL CHOLESTEROL (CALC): 166 mg/dL — AB
Non-HDL Cholesterol (Calc): 189 mg/dL (calc) — ABNORMAL HIGH (ref <130)
TRIGLYCERIDES: 114 mg/dL (ref <150)

## 2018-04-30 LAB — CBC
HEMATOCRIT: 41.2 % (ref 35.0–45.0)
Hemoglobin: 14 g/dL (ref 11.7–15.5)
MCH: 32 pg (ref 27.0–33.0)
MCHC: 34 g/dL (ref 32.0–36.0)
MCV: 94.1 fL (ref 80.0–100.0)
MPV: 10.1 fL (ref 7.5–12.5)
Platelets: 246 10*3/uL (ref 140–400)
RBC: 4.38 10*6/uL (ref 3.80–5.10)
RDW: 12 % (ref 11.0–15.0)
WBC: 5.5 10*3/uL (ref 3.8–10.8)

## 2018-04-30 LAB — COMPREHENSIVE METABOLIC PANEL
AG Ratio: 2 (calc) (ref 1.0–2.5)
ALKALINE PHOSPHATASE (APISO): 96 U/L (ref 33–130)
ALT: 15 U/L (ref 6–29)
AST: 19 U/L (ref 10–35)
Albumin: 4.5 g/dL (ref 3.6–5.1)
BUN: 14 mg/dL (ref 7–25)
CHLORIDE: 104 mmol/L (ref 98–110)
CO2: 28 mmol/L (ref 20–32)
CREATININE: 0.7 mg/dL (ref 0.50–1.05)
Calcium: 9.7 mg/dL (ref 8.6–10.4)
GLOBULIN: 2.3 g/dL (ref 1.9–3.7)
Glucose, Bld: 93 mg/dL (ref 65–99)
Potassium: 4.5 mmol/L (ref 3.5–5.3)
Sodium: 139 mmol/L (ref 135–146)
Total Bilirubin: 0.5 mg/dL (ref 0.2–1.2)
Total Protein: 6.8 g/dL (ref 6.1–8.1)

## 2018-04-30 LAB — T4, FREE: Free T4: 0.9 ng/dL (ref 0.8–1.8)

## 2018-04-30 LAB — T3, FREE: T3 FREE: 3.6 pg/mL (ref 2.3–4.2)

## 2018-04-30 LAB — TSH: TSH: 1.64 m[IU]/L

## 2018-05-07 ENCOUNTER — Ambulatory Visit (INDEPENDENT_AMBULATORY_CARE_PROVIDER_SITE_OTHER): Payer: 59 | Admitting: Physician Assistant

## 2018-05-07 ENCOUNTER — Encounter: Payer: Self-pay | Admitting: Physician Assistant

## 2018-05-07 VITALS — BP 111/41 | HR 56 | Ht 66.5 in | Wt 154.0 lb

## 2018-05-07 DIAGNOSIS — R7989 Other specified abnormal findings of blood chemistry: Secondary | ICD-10-CM

## 2018-05-07 DIAGNOSIS — I493 Ventricular premature depolarization: Secondary | ICD-10-CM | POA: Diagnosis not present

## 2018-05-07 DIAGNOSIS — G43009 Migraine without aura, not intractable, without status migrainosus: Secondary | ICD-10-CM

## 2018-05-07 DIAGNOSIS — Z780 Asymptomatic menopausal state: Secondary | ICD-10-CM

## 2018-05-07 DIAGNOSIS — E039 Hypothyroidism, unspecified: Secondary | ICD-10-CM

## 2018-05-07 DIAGNOSIS — E785 Hyperlipidemia, unspecified: Secondary | ICD-10-CM | POA: Insufficient documentation

## 2018-05-07 DIAGNOSIS — Z0000 Encounter for general adult medical examination without abnormal findings: Secondary | ICD-10-CM | POA: Diagnosis not present

## 2018-05-07 DIAGNOSIS — Z1211 Encounter for screening for malignant neoplasm of colon: Secondary | ICD-10-CM

## 2018-05-07 MED ORDER — AMBULATORY NON FORMULARY MEDICATION: 1 refills | Status: DC

## 2018-05-07 MED ORDER — RIZATRIPTAN BENZOATE 10 MG PO TBDP: ORAL_TABLET | ORAL | 5 refills | Status: DC

## 2018-05-07 MED ORDER — AMBULATORY NON FORMULARY MEDICATION: 4 refills | Status: DC

## 2018-05-07 NOTE — Progress Notes (Signed)
Subjective:     Sydney Bailey is a 52 y.o. female and is here for a comprehensive physical exam. The patient reports no problems.   Social History   Socioeconomic History  . Marital status: Married    Spouse name: Not on file  . Number of children: 3  . Years of education: Not on file  . Highest education level: Not on file  Occupational History  . Occupation: Print production planner  Social Needs  . Financial resource strain: Not on file  . Food insecurity:    Worry: Not on file    Inability: Not on file  . Transportation needs:    Medical: Not on file    Non-medical: Not on file  Tobacco Use  . Smoking status: Never Smoker  . Smokeless tobacco: Never Used  Substance and Sexual Activity  . Alcohol use: No  . Drug use: No  . Sexual activity: Not on file  Lifestyle  . Physical activity:    Days per week: Not on file    Minutes per session: Not on file  . Stress: Not on file  Relationships  . Social connections:    Talks on phone: Not on file    Gets together: Not on file    Attends religious service: Not on file    Active member of club or organization: Not on file    Attends meetings of clubs or organizations: Not on file    Relationship status: Not on file  . Intimate partner violence:    Fear of current or ex partner: Not on file    Emotionally abused: Not on file    Physically abused: Not on file    Forced sexual activity: Not on file  Other Topics Concern  . Not on file  Social History Narrative  . Not on file   Health Maintenance  Topic Date Due  . MAMMOGRAM  09/25/2017  . PAP SMEAR  12/10/2017  . COLONOSCOPY  05/08/2019 (Originally 09/25/2017)  . TETANUS/TDAP  03/28/2027  . INFLUENZA VACCINE  Completed  . HIV Screening  Completed    The following portions of the patient's history were reviewed and updated as appropriate: allergies, current medications, past family history, past medical history, past social history, past surgical history and problem  list.  Review of Systems A comprehensive review of systems was negative.   Objective:    BP (!) 111/41   Pulse (!) 56   Ht 5' 6.5" (1.689 m)   Wt 154 lb (69.9 kg)   LMP 09/01/2010   BMI 24.48 kg/m  General appearance: alert, cooperative and appears stated age Head: Normocephalic, without obvious abnormality, atraumatic Eyes: conjunctivae/corneas clear. PERRL, EOM's intact. Fundi benign. Ears: normal TM's and external ear canals both ears Nose: Nares normal. Septum midline. Mucosa normal. No drainage or sinus tenderness. Throat: lips, mucosa, and tongue normal; teeth and gums normal Neck: no adenopathy, no carotid bruit, no JVD, supple, symmetrical, trachea midline and thyroid not enlarged, symmetric, no tenderness/mass/nodules Back: symmetric, no curvature. ROM normal. No CVA tenderness. Lungs: clear to auscultation bilaterally Heart: regularly irregular rhythm Abdomen: soft, non-tender; bowel sounds normal; no masses,  no organomegaly Extremities: extremities normal, atraumatic, no cyanosis or edema Pulses: 2+ and symmetric Skin: Skin color, texture, turgor normal. No rashes or lesions Lymph nodes: Cervical, supraclavicular, and axillary nodes normal. Neurologic: Alert and oriented X 3, normal strength and tone. Normal symmetric reflexes. Normal coordination and gait    Assessment:    Healthy female exam.  Plan:     ..Sydney Bailey was seen today for annual exam.  Diagnoses and all orders for this visit:  Routine physical examination  Hypothyroidism, unspecified type -     AMBULATORY NON FORMULARY MEDICATION; Medication Name: T4 130mcg and T3 40 mcg NON PORCINE SR capsules.  Colon cancer screening -     Cologuard  PVC's (premature ventricular contractions)  Ventricular quadrigeminy  Low serum progesterone -     AMBULATORY NON FORMULARY MEDICATION; Progesterone capsules Take four 35 mg SR progesterone capsules daily at bedtime  Menopause -     AMBULATORY NON  FORMULARY MEDICATION; Progesterone capsules Take four 35 mg SR progesterone capsules daily at bedtime  Migraine without aura and without status migrainosus, not intractable -     rizatriptan (MAXALT-MLT) 10 MG disintegrating tablet; TAKE 1 TABLET (10 MG TOTAL) BY MOUTH ONCE AS NEEDED FOR MIGRAINE. MAY REPEAT IN 2 HOURS IF NEEDED  Hyperlipidemia LDL goal <160  .Marland Kitchen. [facility] 05/07/2018 10/12/2017 03/27/2017  Decreased Interest 0 1 0  Down, Depressed, Hopeless 0 0 0  PHQ - 2 Score 0 1 0  Altered sleeping 1 2 -  Tired, decreased energy 1 2 -  Change in appetite 0 2 -  Feeling bad or failure about yourself  0 0 -  Trouble concentrating 0 1 -  Moving slowly or fidgety/restless 0 0 -  Suicidal thoughts 0 0 -  PHQ-9 Score 2 8 -  Difficult doing work/chores Not difficult at all Somewhat difficult -   .Marland Kitchen. Discussed 150 minutes of exercise a week.  Encouraged vitamin D 1000 units and Calcium 1300mg  or 4 servings of dairy a day.  Pt has mammogram and pap. Will get records cologuard ordered. .   discussed fasting labs.  LDL elevated but 10 year CV risk was 1.6 percent. Discussed lifestyle changes. Concern that LDL increased from 106 2 years ago to 166 this year. bystolic has been linked to some cholesterol elevation. I think benefit outweighs risk and to stay on bystolic patient agrees but will discuss with cardiology at follow up appt.   Encouraged to start b12 and vitamin D.   Refilled medication.    See After Visit Summary for Counseling Recommendations

## 2018-05-07 NOTE — Progress Notes (Signed)
Reviewed with patient in office. CV 10 year risk is 1.6 percent.

## 2018-05-07 NOTE — Patient Instructions (Signed)
Health Maintenance for Postmenopausal Women Menopause is a normal process in which your reproductive ability comes to an end. This process happens gradually over a span of months to years, usually between the ages of 22 and 9. Menopause is complete when you have missed 12 consecutive menstrual periods. It is important to talk with your health care provider about some of the most common conditions that affect postmenopausal women, such as heart disease, cancer, and bone loss (osteoporosis). Adopting a healthy lifestyle and getting preventive care can help to promote your health and wellness. Those actions can also lower your chances of developing some of these common conditions. What should I know about menopause? During menopause, you may experience a number of symptoms, such as:  Moderate-to-severe hot flashes.  Night sweats.  Decrease in sex drive.  Mood swings.  Headaches.  Tiredness.  Irritability.  Memory problems.  Insomnia.  Choosing to treat or not to treat menopausal changes is an individual decision that you make with your health care provider. What should I know about hormone replacement therapy and supplements? Hormone therapy products are effective for treating symptoms that are associated with menopause, such as hot flashes and night sweats. Hormone replacement carries certain risks, especially as you become older. If you are thinking about using estrogen or estrogen with progestin treatments, discuss the benefits and risks with your health care provider. What should I know about heart disease and stroke? Heart disease, heart attack, and stroke become more likely as you age. This may be due, in part, to the hormonal changes that your body experiences during menopause. These can affect how your body processes dietary fats, triglycerides, and cholesterol. Heart attack and stroke are both medical emergencies. There are many things that you can do to help prevent heart disease  and stroke:  Have your blood pressure checked at least every 1-2 years. High blood pressure causes heart disease and increases the risk of stroke.  If you are 53-22 years old, ask your health care provider if you should take aspirin to prevent a heart attack or a stroke.  Do not use any tobacco products, including cigarettes, chewing tobacco, or electronic cigarettes. If you need help quitting, ask your health care provider.  It is important to eat a healthy diet and maintain a healthy weight. ? Be sure to include plenty of vegetables, fruits, low-fat dairy products, and lean protein. ? Avoid eating foods that are high in solid fats, added sugars, or salt (sodium).  Get regular exercise. This is one of the most important things that you can do for your health. ? Try to exercise for at least 150 minutes each week. The type of exercise that you do should increase your heart rate and make you sweat. This is known as moderate-intensity exercise. ? Try to do strengthening exercises at least twice each week. Do these in addition to the moderate-intensity exercise.  Know your numbers.Ask your health care provider to check your cholesterol and your blood glucose. Continue to have your blood tested as directed by your health care provider.  What should I know about cancer screening? There are several types of cancer. Take the following steps to reduce your risk and to catch any cancer development as early as possible. Breast Cancer  Practice breast self-awareness. ? This means understanding how your breasts normally appear and feel. ? It also means doing regular breast self-exams. Let your health care provider know about any changes, no matter how small.  If you are 40  or older, have a clinician do a breast exam (clinical breast exam or CBE) every year. Depending on your age, family history, and medical history, it may be recommended that you also have a yearly breast X-ray (mammogram).  If you  have a family history of breast cancer, talk with your health care provider about genetic screening.  If you are at high risk for breast cancer, talk with your health care provider about having an MRI and a mammogram every year.  Breast cancer (BRCA) gene test is recommended for women who have family members with BRCA-related cancers. Results of the assessment will determine the need for genetic counseling and BRCA1 and for BRCA2 testing. BRCA-related cancers include these types: ? Breast. This occurs in males or females. ? Ovarian. ? Tubal. This may also be called fallopian tube cancer. ? Cancer of the abdominal or pelvic lining (peritoneal cancer). ? Prostate. ? Pancreatic.  Cervical, Uterine, and Ovarian Cancer Your health care provider may recommend that you be screened regularly for cancer of the pelvic organs. These include your ovaries, uterus, and vagina. This screening involves a pelvic exam, which includes checking for microscopic changes to the surface of your cervix (Pap test).  For women ages 21-65, health care providers may recommend a pelvic exam and a Pap test every three years. For women ages 79-65, they may recommend the Pap test and pelvic exam, combined with testing for human papilloma virus (HPV), every five years. Some types of HPV increase your risk of cervical cancer. Testing for HPV may also be done on women of any age who have unclear Pap test results.  Other health care providers may not recommend any screening for nonpregnant women who are considered low risk for pelvic cancer and have no symptoms. Ask your health care provider if a screening pelvic exam is right for you.  If you have had past treatment for cervical cancer or a condition that could lead to cancer, you need Pap tests and screening for cancer for at least 20 years after your treatment. If Pap tests have been discontinued for you, your risk factors (such as having a new sexual partner) need to be  reassessed to determine if you should start having screenings again. Some women have medical problems that increase the chance of getting cervical cancer. In these cases, your health care provider may recommend that you have screening and Pap tests more often.  If you have a family history of uterine cancer or ovarian cancer, talk with your health care provider about genetic screening.  If you have vaginal bleeding after reaching menopause, tell your health care provider.  There are currently no reliable tests available to screen for ovarian cancer.  Lung Cancer Lung cancer screening is recommended for adults 69-62 years old who are at high risk for lung cancer because of a history of smoking. A yearly low-dose CT scan of the lungs is recommended if you:  Currently smoke.  Have a history of at least 30 pack-years of smoking and you currently smoke or have quit within the past 15 years. A pack-year is smoking an average of one pack of cigarettes per day for one year.  Yearly screening should:  Continue until it has been 15 years since you quit.  Stop if you develop a health problem that would prevent you from having lung cancer treatment.  Colorectal Cancer  This type of cancer can be detected and can often be prevented.  Routine colorectal cancer screening usually begins at  age 42 and continues through age 45.  If you have risk factors for colon cancer, your health care provider may recommend that you be screened at an earlier age.  If you have a family history of colorectal cancer, talk with your health care provider about genetic screening.  Your health care provider may also recommend using home test kits to check for hidden blood in your stool.  A small camera at the end of a tube can be used to examine your colon directly (sigmoidoscopy or colonoscopy). This is done to check for the earliest forms of colorectal cancer.  Direct examination of the colon should be repeated every  5-10 years until age 71. However, if early forms of precancerous polyps or small growths are found or if you have a family history or genetic risk for colorectal cancer, you may need to be screened more often.  Skin Cancer  Check your skin from head to toe regularly.  Monitor any moles. Be sure to tell your health care provider: ? About any new moles or changes in moles, especially if there is a change in a mole's shape or color. ? If you have a mole that is larger than the size of a pencil eraser.  If any of your family members has a history of skin cancer, especially at a young age, talk with your health care provider about genetic screening.  Always use sunscreen. Apply sunscreen liberally and repeatedly throughout the day.  Whenever you are outside, protect yourself by wearing long sleeves, pants, a wide-brimmed hat, and sunglasses.  What should I know about osteoporosis? Osteoporosis is a condition in which bone destruction happens more quickly than new bone creation. After menopause, you may be at an increased risk for osteoporosis. To help prevent osteoporosis or the bone fractures that can happen because of osteoporosis, the following is recommended:  If you are 46-71 years old, get at least 1,000 mg of calcium and at least 600 mg of vitamin D per day.  If you are older than age 55 but younger than age 65, get at least 1,200 mg of calcium and at least 600 mg of vitamin D per day.  If you are older than age 54, get at least 1,200 mg of calcium and at least 800 mg of vitamin D per day.  Smoking and excessive alcohol intake increase the risk of osteoporosis. Eat foods that are rich in calcium and vitamin D, and do weight-bearing exercises several times each week as directed by your health care provider. What should I know about how menopause affects my mental health? Depression may occur at any age, but it is more common as you become older. Common symptoms of depression  include:  Low or sad mood.  Changes in sleep patterns.  Changes in appetite or eating patterns.  Feeling an overall lack of motivation or enjoyment of activities that you previously enjoyed.  Frequent crying spells.  Talk with your health care provider if you think that you are experiencing depression. What should I know about immunizations? It is important that you get and maintain your immunizations. These include:  Tetanus, diphtheria, and pertussis (Tdap) booster vaccine.  Influenza every year before the flu season begins.  Pneumonia vaccine.  Shingles vaccine.  Your health care provider may also recommend other immunizations. This information is not intended to replace advice given to you by your health care provider. Make sure you discuss any questions you have with your health care provider. Document Released: 07/14/2005  Document Revised: 12/10/2015 Document Reviewed: 02/23/2015 Elsevier Interactive Patient Education  2018 Elsevier Inc.  

## 2018-05-24 LAB — COLOGUARD: Cologuard: NEGATIVE

## 2018-06-13 ENCOUNTER — Encounter: Payer: Self-pay | Admitting: Physician Assistant

## 2018-07-17 NOTE — Progress Notes (Signed)
HPI: FU palpitations. Labs 2/18 showed normal TSH, Mg and K.Echo4/18 showed normal LV function. Holter 4/18 showed sinus brady, sinus, sinus tach, PVCs and rare couplet. Since last seen,   Current Outpatient Medications  Medication Sig Dispense Refill  . AMBULATORY NON FORMULARY MEDICATION Medication Name: T4 130mcg and T3 40 mcg NON PORCINE SR capsules. 90 capsule 4  . AMBULATORY NON FORMULARY MEDICATION Progesterone capsules Take four 35 mg SR progesterone capsules daily at bedtime 120 capsule 1  . B Complex Vitamins (VITAMIN B-COMPLEX PO) Take 1 tablet by mouth daily.    . citalopram (CELEXA) 40 MG tablet TAKE 1 TABLET BY MOUTH  DAILY 90 tablet 3  . nebivolol (BYSTOLIC) 2.5 MG tablet Take 1 tablet (2.5 mg total) by mouth 2 (two) times daily. 180 tablet 3  . Probiotic Product (PROBIOTIC PO) Take 1 capsule by mouth daily.    . rizatriptan (MAXALT-MLT) 10 MG disintegrating tablet TAKE 1 TABLET (10 MG TOTAL) BY MOUTH ONCE AS NEEDED FOR MIGRAINE. MAY REPEAT IN 2 HOURS IF NEEDED 12 tablet 5   No current facility-administered medications for this visit.      Past Medical History:  Diagnosis Date  . Allergy   . Anxiety   . Depression   . Family history of anesthesia complication    Father had difficult time waking up   . Heart palpitations    PVC's  . Hypothyroidism 11-08   started post- partum  . Migraines   . PONV (postoperative nausea and vomiting)    Difficult waking up; had bad migraine after bunionectomy  . PVC's (premature ventricular contractions)    Pt saw Dr. Jens Somrenshaw for this but pt stated it was fine    Past Surgical History:  Procedure Laterality Date  . BUNIONECTOMY Bilateral    x 2 on both feet  . CHOLECYSTECTOMY N/A 05/22/2013   Procedure: LAPAROSCOPIC CHOLECYSTECTOMY;  Surgeon: Shelly Rubensteinouglas A Blackman, MD;  Location: [facility] OR;  Service: General;  Laterality: N/A;  . essure BTL    . UPPER GI ENDOSCOPY      Social History   Socioeconomic History  .  Marital status: Married    Spouse name: Not on file  . Number of children: 3  . Years of education: Not on file  . Highest education level: Not on file  Occupational History  . Occupation: Print production plannerffice Manager  Social Needs  . Financial resource strain: Not on file  . Food insecurity:    Worry: Not on file    Inability: Not on file  . Transportation needs:    Medical: Not on file    Non-medical: Not on file  Tobacco Use  . Smoking status: Never Smoker  . Smokeless tobacco: Never Used  Substance and Sexual Activity  . Alcohol use: No  . Drug use: No  . Sexual activity: Not on file  Lifestyle  . Physical activity:    Days per week: Not on file    Minutes per session: Not on file  . Stress: Not on file  Relationships  . Social connections:    Talks on phone: Not on file    Gets together: Not on file    Attends religious service: Not on file    Active member of club or organization: Not on file    Attends meetings of clubs or organizations: Not on file    Relationship status: Not on file  . Intimate partner violence:    Fear of current or ex  partner: Not on file    Emotionally abused: Not on file    Physically abused: Not on file    Forced sexual activity: Not on file  Other Topics Concern  . Not on file  Social History Narrative  . Not on file    Family History  Problem Relation Age of Onset  . Heart disease Father        Atrial fibrillation  . Atrial fibrillation Father   . Atrial fibrillation Mother   . Cancer Maternal Aunt        breast  . Cancer Paternal Aunt        non hodgkins lymphoma  . Cancer Maternal Grandmother        breast    ROS: no fevers or chills, productive cough, hemoptysis, dysphasia, odynophagia, melena, hematochezia, dysuria, hematuria, rash, seizure activity, orthopnea, PND, pedal edema, claudication. Remaining systems are negative.  Physical Exam: Well-developed well-nourished in no acute distress.  Skin is warm and dry.  HEENT is  normal.  Neck is supple.  Chest is clear to auscultation with normal expansion.  Cardiovascular exam is regular rate and rhythm.  Abdominal exam nontender or distended. No masses palpated. Extremities show no edema. neuro grossly intact  ECG-sinus rhythm with ventricular trigeminy, no ST changes.  Personally reviewed  A/P  1 palpitations-plan to continue present dose of beta-blocker.  Symptoms are reasonably well controlled.  2 PVCs-as outlined above palpitations are reasonably well controlled and LV function is normal.  Continue beta-blocker.  3 anxiety-Per primary care.  Olga Millers, MD

## 2018-07-31 ENCOUNTER — Encounter: Payer: Self-pay | Admitting: Cardiology

## 2018-07-31 ENCOUNTER — Ambulatory Visit: Payer: 59 | Admitting: Cardiology

## 2018-07-31 VITALS — BP 95/60 | HR 65 | Ht 66.5 in | Wt 156.8 lb

## 2018-07-31 DIAGNOSIS — R002 Palpitations: Secondary | ICD-10-CM | POA: Diagnosis not present

## 2018-07-31 DIAGNOSIS — I493 Ventricular premature depolarization: Secondary | ICD-10-CM | POA: Diagnosis not present

## 2018-07-31 MED ORDER — NEBIVOLOL HCL 2.5 MG PO TABS: 2.5000 mg | ORAL_TABLET | Freq: Two times a day (BID) | ORAL | 3 refills | Status: DC

## 2018-07-31 NOTE — Patient Instructions (Signed)
Your physician wants you to follow-up in: one year with dr crenshaw You will receive a reminder letter in the mail two months in advance. If you don't receive a letter, please call our office to schedule the follow-up appointment.  

## 2018-11-14 ENCOUNTER — Telehealth: Payer: Self-pay | Admitting: Cardiology

## 2018-11-14 NOTE — Telephone Encounter (Signed)
Spoke with pt, she was not allowed to give blood with the [facility] [employer] and her son is organizing a blood drive and she would like to be able to donate. Will forward for dr Stanford Breed review

## 2018-11-14 NOTE — Telephone Encounter (Signed)
New Message:   Pt says she needs a letter from dr Stanford Breed please. She needs this letter so she will be able to donate blood because of her PVC's.

## 2018-11-15 ENCOUNTER — Encounter: Payer: Self-pay | Admitting: *Deleted

## 2018-11-15 NOTE — Telephone Encounter (Signed)
Ok for note to donate blood Kirk Ruths

## 2018-11-15 NOTE — Telephone Encounter (Signed)
Left message for patient letter generated and placed in the mail.

## 2019-02-25 LAB — HM MAMMOGRAPHY

## 2019-03-24 ENCOUNTER — Other Ambulatory Visit: Payer: Self-pay | Admitting: Physician Assistant

## 2019-03-24 DIAGNOSIS — F3342 Major depressive disorder, recurrent, in full remission: Secondary | ICD-10-CM

## 2019-04-24 ENCOUNTER — Other Ambulatory Visit: Payer: Self-pay

## 2019-04-24 DIAGNOSIS — Z20822 Contact with and (suspected) exposure to covid-19: Secondary | ICD-10-CM

## 2019-04-27 ENCOUNTER — Telehealth: Payer: Self-pay

## 2019-04-27 LAB — NOVEL CORONAVIRUS, NAA: SARS-CoV-2, NAA: NOT DETECTED

## 2019-04-27 NOTE — Telephone Encounter (Signed)
Pt called for covid results- advised that results not back.

## 2019-06-12 ENCOUNTER — Encounter: Payer: Self-pay | Admitting: *Deleted

## 2019-06-12 NOTE — Telephone Encounter (Signed)
Received paperwork from [employer] regarding bystolic not being covered by her insurance for the new year. Left message for pt to call to disccuss

## 2019-06-12 NOTE — Telephone Encounter (Signed)
This encounter was created in error - please disregard.

## 2019-06-13 ENCOUNTER — Other Ambulatory Visit: Payer: Self-pay | Admitting: Physician Assistant

## 2019-06-13 DIAGNOSIS — F3342 Major depressive disorder, recurrent, in full remission: Secondary | ICD-10-CM

## 2019-06-16 ENCOUNTER — Encounter: Payer: Self-pay | Admitting: Physician Assistant

## 2019-06-16 ENCOUNTER — Other Ambulatory Visit: Payer: Self-pay | Admitting: Cardiology

## 2019-06-16 DIAGNOSIS — R7989 Other specified abnormal findings of blood chemistry: Secondary | ICD-10-CM

## 2019-06-16 DIAGNOSIS — E039 Hypothyroidism, unspecified: Secondary | ICD-10-CM

## 2019-06-16 DIAGNOSIS — Z780 Asymptomatic menopausal state: Secondary | ICD-10-CM

## 2019-06-17 ENCOUNTER — Other Ambulatory Visit: Payer: Self-pay | Admitting: *Deleted

## 2019-06-17 MED ORDER — NEBIVOLOL HCL 2.5 MG PO TABS: 2.5000 mg | ORAL_TABLET | Freq: Two times a day (BID) | ORAL | 3 refills | Status: DC

## 2019-06-17 NOTE — Telephone Encounter (Signed)
Updated.

## 2019-06-17 NOTE — Telephone Encounter (Signed)
Sydney Bailey, can you please update her insurance info?  Thanks

## 2019-07-01 MED ORDER — AMBULATORY NON FORMULARY MEDICATION: 0 refills | Status: DC

## 2019-07-01 NOTE — Telephone Encounter (Signed)
Patient called back and states they do not fill these at [facility]. Changed back to [facility] in [place] per her request and faxed to [phone] with confirmation received.

## 2019-07-01 NOTE — Telephone Encounter (Signed)
Patient left vm requesting her compounded medications T4 and T3 40 mcg NON PORCINE SR capsules AND Progesterone capsules Take four 35 mg SR progesterone capsules daily at bedtime . Be sent to [facility] due to insurance change.   30 day supply written to be faxed to pharmacy with note that patient needs an appt for further refills.

## 2019-07-16 ENCOUNTER — Other Ambulatory Visit: Payer: Self-pay

## 2019-07-16 ENCOUNTER — Ambulatory Visit (INDEPENDENT_AMBULATORY_CARE_PROVIDER_SITE_OTHER): Payer: BC Managed Care – PPO | Admitting: Physician Assistant

## 2019-07-16 VITALS — BP 105/69 | HR 74 | Ht 66.5 in | Wt 135.0 lb

## 2019-07-16 DIAGNOSIS — Z13220 Encounter for screening for lipoid disorders: Secondary | ICD-10-CM | POA: Diagnosis not present

## 2019-07-16 DIAGNOSIS — Z780 Asymptomatic menopausal state: Secondary | ICD-10-CM

## 2019-07-16 DIAGNOSIS — F3342 Major depressive disorder, recurrent, in full remission: Secondary | ICD-10-CM | POA: Diagnosis not present

## 2019-07-16 DIAGNOSIS — R7989 Other specified abnormal findings of blood chemistry: Secondary | ICD-10-CM

## 2019-07-16 DIAGNOSIS — R42 Dizziness and giddiness: Secondary | ICD-10-CM | POA: Diagnosis not present

## 2019-07-16 DIAGNOSIS — Z0000 Encounter for general adult medical examination without abnormal findings: Secondary | ICD-10-CM

## 2019-07-16 DIAGNOSIS — Z131 Encounter for screening for diabetes mellitus: Secondary | ICD-10-CM | POA: Diagnosis not present

## 2019-07-16 DIAGNOSIS — E039 Hypothyroidism, unspecified: Secondary | ICD-10-CM | POA: Diagnosis not present

## 2019-07-16 DIAGNOSIS — G43009 Migraine without aura, not intractable, without status migrainosus: Secondary | ICD-10-CM

## 2019-07-16 MED ORDER — AMBULATORY NON FORMULARY MEDICATION: 3 refills | Status: DC

## 2019-07-16 MED ORDER — CITALOPRAM HYDROBROMIDE 40 MG PO TABS: ORAL_TABLET | ORAL | 3 refills | Status: DC

## 2019-07-16 MED ORDER — FLUTICASONE PROPIONATE 50 MCG/ACT NA SUSP: 2.0000 | Freq: Every day | NASAL | 6 refills | Status: DC

## 2019-07-16 MED ORDER — RIZATRIPTAN BENZOATE 10 MG PO TBDP: ORAL_TABLET | ORAL | 5 refills | Status: DC

## 2019-07-16 NOTE — Progress Notes (Signed)
Subjective:     Sydney Bailey is a 54 y.o. female and is here for a comprehensive physical exam. The patient reports problems - vertigo episode that started saturday.  hx of vertigo. Not doing epleys. Resting and hydrating. Meclizine not helping. Not as bad as in the past.   Social History   Socioeconomic History  . Marital status: Married    Spouse name: Not on file  . Number of children: 3  . Years of education: Not on file  . Highest education level: Not on file  Occupational History  . Occupation: Glass blower/designer  Tobacco Use  . Smoking status: Never Smoker  . Smokeless tobacco: Never Used  Substance and Sexual Activity  . Alcohol use: No  . Drug use: No  . Sexual activity: Not on file  Other Topics Concern  . Not on file  Social History Narrative  . Not on file   Social Determinants of Health   Financial Resource Strain:   . Difficulty of Paying Living Expenses: Not on file  Food Insecurity:   . Worried About Charity fundraiser in the Last Year: Not on file  . Ran Out of Food in the Last Year: Not on file  Transportation Needs:   . Lack of Transportation (Medical): Not on file  . Lack of Transportation (Non-Medical): Not on file  Physical Activity:   . Days of Exercise per Week: Not on file  . Minutes of Exercise per Session: Not on file  Stress:   . Feeling of Stress : Not on file  Social Connections:   . Frequency of Communication with Friends and Family: Not on file  . Frequency of Social Gatherings with Friends and Family: Not on file  . Attends Religious Services: Not on file  . Active Member of Clubs or Organizations: Not on file  . Attends Archivist Meetings: Not on file  . Marital Status: Not on file  Intimate Partner Violence:   . Fear of Current or Ex-Partner: Not on file  . Emotionally Abused: Not on file  . Physically Abused: Not on file  . Sexually Abused: Not on file   Health Maintenance  Topic Date Due  . PAP SMEAR-Modifier   01/09/2020  . MAMMOGRAM  02/03/2021  . Fecal DNA (Cologuard)  05/20/2021  . TETANUS/TDAP  03/28/2027  . INFLUENZA VACCINE  Completed  . HIV Screening  Completed    The following portions of the patient's history were reviewed and updated as appropriate: allergies, current medications, past family history, past medical history, past social history, past surgical history and problem list.  Review of Systems Pertinent items noted in HPI and remainder of comprehensive ROS otherwise negative.   Objective:    BP 105/69   Pulse 74   Ht 5' 6.5" (1.689 m)   Wt 135 lb (61.2 kg)   LMP 09/01/2010   SpO2 95%   BMI 21.46 kg/m  General appearance: alert, cooperative and appears stated age Head: Normocephalic, without obvious abnormality, atraumatic Eyes: conjunctivae/corneas clear. PERRL, EOM's intact. Fundi benign. Ears: normal TM and external ear canal left ear and abnormal TM right ear - bulging Nose: Nares normal. Septum midline. Mucosa normal. No drainage or sinus tenderness. Throat: lips, mucosa, and tongue normal; teeth and gums normal Neck: no adenopathy, no carotid bruit, no JVD, supple, symmetrical, trachea midline and thyroid not enlarged, symmetric, no tenderness/mass/nodules Back: symmetric, no curvature. ROM normal. No CVA tenderness. Lungs: clear to auscultation bilaterally Heart: regular rate  and rhythm, S1, S2 normal, no murmur, click, rub or gallop Abdomen: soft, non-tender; bowel sounds normal; no masses,  no organomegaly Extremities: extremities normal, atraumatic, no cyanosis or edema Pulses: 2+ and symmetric Skin: Skin color, texture, turgor normal. No rashes or lesions Lymph nodes: Cervical, supraclavicular, and axillary nodes normal. Neurologic: Alert and oriented X 3, normal strength and tone. Normal symmetric reflexes. Normal coordination and gait   dix hallpike negative for nystagmus but right sided dizziness reported with maneuver and nausea.  .. Depression  screen [facility] 2/9 07/16/2019 05/07/2018 10/12/2017 03/27/2017  Decreased Interest 0 0 1 0  Down, Depressed, Hopeless 1 0 0 0  PHQ - 2 Score 1 0 1 0  Altered sleeping 1 1 2  -  Tired, decreased energy 1 1 2  -  Change in appetite 1 0 2 -  Feeling bad or failure about yourself  0 0 0 -  Trouble concentrating 0 0 1 -  Moving slowly or fidgety/restless 0 0 0 -  Suicidal thoughts 0 0 0 -  PHQ-9 Score 4 2 8  -  Difficult doing work/chores Not difficult at all Not difficult at all Somewhat difficult -    Assessment:    Healthy female exam.      Plan:     [name] was seen today for annual exam.  Diagnoses and all orders for this visit:  Routine physical examination -     Thyroid Panel With TSH -     Lipid Panel w/reflex Direct LDL -     COMPLETE METABOLIC PANEL WITH GFR  Recurrent major depressive disorder, in full remission (HCC) -     citalopram (CELEXA) 40 MG tablet; TAKE 1 TABLET BY MOUTH  DAILY.  Vertigo -     fluticasone (FLONASE) 50 MCG/ACT nasal spray; Place 2 sprays into both nostrils daily.  Low serum progesterone -     AMBULATORY NON FORMULARY MEDICATION; Progesterone capsules Take two tablets 70mg  SR progesterone capsules daily at bedtime  Menopause -     AMBULATORY NON FORMULARY MEDICATION; Progesterone capsules Take two tablets 70mg  SR progesterone capsules daily at bedtime  Hypothyroidism, unspecified type -     Thyroid Panel With TSH -     AMBULATORY NON FORMULARY MEDICATION; Medication Name: T4 Marland Kitchen and T3 40 mcg NON PORCINE SR capsules.  Migraine without aura and without status migrainosus, not intractable -     rizatriptan (MAXALT-MLT) 10 MG disintegrating tablet; TAKE 1 TABLET (10 MG TOTAL) BY MOUTH ONCE AS NEEDED FOR MIGRAINE. MAY REPEAT IN 2 HOURS IF NEEDED  Screening for diabetes mellitus -     COMPLETE METABOLIC PANEL WITH GFR  Screening for lipid disorders -     Lipid Panel w/reflex Direct LDL  .Marland Kitchen Discussed 150 minutes of exercise a week.   Encouraged vitamin D 1000 units and Calcium 1300mg  or 4 servings of dairy a day.  Pap and mammogram UTD. cologuard UTD.  Flu done.  First shingles needs 2nd dose. Declines due to reaction.  Fasting labs ordered.  Medications refilled.   Ventricular quadrigeminy- managed by cardiology.  Vertigo-start epleys. If no improvement consider vestibular rehab. flonase to start due to some fluid in right ear. Follow up as needed.    See After Visit Summary for Counseling Recommendations

## 2019-07-16 NOTE — Patient Instructions (Signed)
Health Maintenance, Female Adopting a healthy lifestyle and getting preventive care are important in promoting health and wellness. Ask your health care provider about:  The right schedule for you to have regular tests and exams.  Things you can do on your own to prevent diseases and keep yourself healthy. What should I know about diet, weight, and exercise? Eat a healthy diet   Eat a diet that includes plenty of vegetables, fruits, low-fat dairy products, and lean protein.  Do not eat a lot of foods that are high in solid fats, added sugars, or sodium. Maintain a healthy weight Body mass index (BMI) is used to identify weight problems. It estimates body fat based on height and weight. Your health care provider can help determine your BMI and help you achieve or maintain a healthy weight. Get regular exercise Get regular exercise. This is one of the most important things you can do for your health. Most adults should:  Exercise for at least 150 minutes each week. The exercise should increase your heart rate and make you sweat (moderate-intensity exercise).  Do strengthening exercises at least twice a week. This is in addition to the moderate-intensity exercise.  Spend less time sitting. Even light physical activity can be beneficial. Watch cholesterol and blood lipids Have your blood tested for lipids and cholesterol at 52 years of age, then have this test every 5 years. Have your cholesterol levels checked more often if:  Your lipid or cholesterol levels are high.  You are older than 52 years of age.  You are at high risk for heart disease. What should I know about cancer screening? Depending on your health history and family history, you may need to have cancer screening at various ages. This may include screening for:  Breast cancer.  Cervical cancer.  Colorectal cancer.  Skin cancer.  Lung cancer. What should I know about heart disease, diabetes, and high blood  pressure? Blood pressure and heart disease  High blood pressure causes heart disease and increases the risk of stroke. This is more likely to develop in people who have high blood pressure readings, are of African descent, or are overweight.  Have your blood pressure checked: ? Every 3-5 years if you are 18-39 years of age. ? Every year if you are 40 years old or older. Diabetes Have regular diabetes screenings. This checks your fasting blood sugar level. Have the screening done:  Once every three years after age 40 if you are at a normal weight and have a low risk for diabetes.  More often and at a younger age if you are overweight or have a high risk for diabetes. What should I know about preventing infection? Hepatitis B If you have a higher risk for hepatitis B, you should be screened for this virus. Talk with your health care provider to find out if you are at risk for hepatitis B infection. Hepatitis C Testing is recommended for:  Everyone born from 1945 through 1965.  Anyone with known risk factors for hepatitis C. Sexually transmitted infections (STIs)  Get screened for STIs, including gonorrhea and chlamydia, if: ? You are sexually active and are younger than 52 years of age. ? You are older than 52 years of age and your health care provider tells you that you are at risk for this type of infection. ? Your sexual activity has changed since you were last screened, and you are at increased risk for chlamydia or gonorrhea. Ask your health care provider if   you are at risk.  Ask your health care provider about whether you are at high risk for HIV. Your health care provider may recommend a prescription medicine to help prevent HIV infection. If you choose to take medicine to prevent HIV, you should first get tested for HIV. You should then be tested every 3 months for as long as you are taking the medicine. Pregnancy  If you are about to stop having your period (premenopausal) and  you may become pregnant, seek counseling before you get pregnant.  Take 400 to 800 micrograms (mcg) of folic acid every day if you become pregnant.  Ask for birth control (contraception) if you want to prevent pregnancy. Osteoporosis and menopause Osteoporosis is a disease in which the bones lose minerals and strength with aging. This can result in bone fractures. If you are 65 years old or older, or if you are at risk for osteoporosis and fractures, ask your health care provider if you should:  Be screened for bone loss.  Take a calcium or vitamin D supplement to lower your risk of fractures.  Be given hormone replacement therapy (HRT) to treat symptoms of menopause. Follow these instructions at home: Lifestyle  Do not use any products that contain nicotine or tobacco, such as cigarettes, e-cigarettes, and chewing tobacco. If you need help quitting, ask your health care provider.  Do not use street drugs.  Do not share needles.  Ask your health care provider for help if you need support or information about quitting drugs. Alcohol use  Do not drink alcohol if: ? Your health care provider tells you not to drink. ? You are pregnant, may be pregnant, or are planning to become pregnant.  If you drink alcohol: ? Limit how much you use to 0-1 drink a day. ? Limit intake if you are breastfeeding.  Be aware of how much alcohol is in your drink. In the [place] one drink equals one 12 oz bottle of beer (355 mL), one 5 oz glass of wine (148 mL), or one 1 oz glass of hard liquor (44 mL). General instructions  Schedule regular health, dental, and eye exams.  Stay current with your vaccines.  Tell your health care provider if: ? You often feel depressed. ? You have ever been abused or do not feel safe at home. Summary  Adopting a healthy lifestyle and getting preventive care are important in promoting health and wellness.  Follow your health care provider's instructions about healthy  diet, exercising, and getting tested or screened for diseases.  Follow your health care provider's instructions on monitoring your cholesterol and blood pressure. This information is not intended to replace advice given to you by your health care provider. Make sure you discuss any questions you have with your health care provider. Document Revised: 05/15/2018 Document Reviewed: 05/15/2018 Elsevier Patient Education  2020 Elsevier Inc.  

## 2019-07-17 LAB — COMPLETE METABOLIC PANEL WITH GFR
AG Ratio: 1.7 (calc) (ref 1.0–2.5)
ALT: 10 U/L (ref 6–29)
AST: 17 U/L (ref 10–35)
Albumin: 4.3 g/dL (ref 3.6–5.1)
Alkaline phosphatase (APISO): 83 U/L (ref 37–153)
BUN: 14 mg/dL (ref 7–25)
CO2: 28 mmol/L (ref 20–32)
Calcium: 9.3 mg/dL (ref 8.6–10.4)
Chloride: 105 mmol/L (ref 98–110)
Creat: 0.72 mg/dL (ref 0.50–1.05)
GFR, Est African American: 112 mL/min/{1.73_m2} (ref >=60)
GFR, Est Non African American: 97 mL/min/{1.73_m2} (ref >=60)
Globulin: 2.6 g/dL (calc) (ref 1.9–3.7)
Glucose, Bld: 87 mg/dL (ref 65–139)
Potassium: 4 mmol/L (ref 3.5–5.3)
Sodium: 139 mmol/L (ref 135–146)
Total Bilirubin: 0.5 mg/dL (ref 0.2–1.2)
Total Protein: 6.9 g/dL (ref 6.1–8.1)

## 2019-07-17 LAB — LIPID PANEL W/REFLEX DIRECT LDL
Cholesterol: 197 mg/dL (ref <200)
HDL: 53 mg/dL (ref >=50)
LDL Cholesterol (Calc): 119 mg/dL (calc) — ABNORMAL HIGH
Non-HDL Cholesterol (Calc): 144 mg/dL (calc) — ABNORMAL HIGH (ref <130)
Total CHOL/HDL Ratio: 3.7 (calc) (ref <5.0)
Triglycerides: 137 mg/dL (ref <150)

## 2019-07-17 LAB — THYROID PANEL WITH TSH
Free Thyroxine Index: 1.8 (ref 1.4–3.8)
T3 Uptake: 31 % (ref 22–35)
T4, Total: 5.7 ug/dL (ref 5.1–11.9)
TSH: 0.51 mIU/L

## 2019-07-17 NOTE — Progress Notes (Signed)
[name],   Thyroid panel in normal range. Cholesterol looks so much better. Way to go. Kidney, liver, glucose look great.   Sydney Bailey

## 2019-07-18 ENCOUNTER — Encounter: Payer: Self-pay | Admitting: Physician Assistant

## 2019-07-21 ENCOUNTER — Telehealth: Payer: Self-pay | Admitting: Neurology

## 2019-07-21 DIAGNOSIS — R42 Dizziness and giddiness: Secondary | ICD-10-CM

## 2019-07-21 NOTE — Telephone Encounter (Signed)
Patient called back after visit and decided she does want to do physical therapy for vertigo. Order entered. Patient made aware.   Sydney Bailey - FYI.

## 2019-07-30 ENCOUNTER — Ambulatory Visit: Payer: 59 | Admitting: Cardiology

## 2019-08-05 NOTE — Progress Notes (Signed)
HPI:FU palpitations. Echo4/18 showed normal LV function. Holter 4/18 showed sinus brady, sinus, sinus tach, PVCs and rare couplet. Since last seen,there is no dyspnea, chest pain or syncope.  She has run out of her beta-blocker recently and her palpitations are slightly worse but they were controlled when she was on her previous Bystolic.  Current Outpatient Medications  Medication Sig Dispense Refill  . AMBULATORY NON FORMULARY MEDICATION Progesterone capsules Take two tablets 70mg  SR progesterone capsules daily at bedtime 180 capsule 3  . AMBULATORY NON FORMULARY MEDICATION Medication Name: T4 114mcg and T3 40 mcg NON PORCINE SR capsules. 90 capsule 3  . B Complex Vitamins (VITAMIN B-COMPLEX PO) Take 1 tablet by mouth daily.    . citalopram (CELEXA) 40 MG tablet TAKE 1 TABLET BY MOUTH  DAILY. 90 tablet 3  . fluticasone (FLONASE) 50 MCG/ACT nasal spray Place 2 sprays into both nostrils daily. 16 g 6  . Probiotic Product (PROBIOTIC PO) Take 1 capsule by mouth daily.    . rizatriptan (MAXALT-MLT) 10 MG disintegrating tablet TAKE 1 TABLET (10 MG TOTAL) BY MOUTH ONCE AS NEEDED FOR MIGRAINE. MAY REPEAT IN 2 HOURS IF NEEDED 12 tablet 5  . BYSTOLIC 2.5 MG tablet TAKE 1 TABLET BY MOUTH TWICE A DAY (Patient not taking: Reported on 08/13/2019) 60 tablet 0   No current facility-administered medications for this visit.     Past Medical History:  Diagnosis Date  . Allergy   . Anxiety   . Depression   . Family history of anesthesia complication    Father had difficult time waking up   . Heart palpitations    PVC's  . Hypothyroidism 11-08   started post- partum  . Migraines   . PONV (postoperative nausea and vomiting)    Difficult waking up; had bad migraine after bunionectomy  . PVC's (premature ventricular contractions)    Pt saw Dr. Stanford Breed for this but pt stated it was fine    Past Surgical History:  Procedure Laterality Date  . BUNIONECTOMY Bilateral    x 2 on both feet  .  CHOLECYSTECTOMY N/A 05/22/2013   Procedure: LAPAROSCOPIC CHOLECYSTECTOMY;  Surgeon: Harl Bowie, MD;  Location: [place];  Service: General;  Laterality: N/A;  . essure BTL    . UPPER GI ENDOSCOPY      Social History   Socioeconomic History  . Marital status: Married    Spouse name: Not on file  . Number of children: 3  . Years of education: Not on file  . Highest education level: Not on file  Occupational History  . Occupation: Glass blower/designer  Tobacco Use  . Smoking status: Never Smoker  . Smokeless tobacco: Never Used  Substance and Sexual Activity  . Alcohol use: No  . Drug use: No  . Sexual activity: Not on file  Other Topics Concern  . Not on file  Social History Narrative  . Not on file   Social Determinants of Health   Financial Resource Strain:   . Difficulty of Paying Living Expenses: Not on file  Food Insecurity:   . Worried About Charity fundraiser in the Last Year: Not on file  . Ran Out of Food in the Last Year: Not on file  Transportation Needs:   . Lack of Transportation (Medical): Not on file  . Lack of Transportation (Non-Medical): Not on file  Physical Activity:   . Days of Exercise per Week: Not on file  . Minutes of  Exercise per Session: Not on file  Stress:   . Feeling of Stress : Not on file  Social Connections:   . Frequency of Communication with Friends and Family: Not on file  . Frequency of Social Gatherings with Friends and Family: Not on file  . Attends Religious Services: Not on file  . Active Member of Clubs or Organizations: Not on file  . Attends Banker Meetings: Not on file  . Marital Status: Not on file  Intimate Partner Violence:   . Fear of Current or Ex-Partner: Not on file  . Emotionally Abused: Not on file  . Physically Abused: Not on file  . Sexually Abused: Not on file    Family History  Problem Relation Age of Onset  . Heart disease Father        Atrial fibrillation  . Atrial fibrillation Father    . Atrial fibrillation Mother   . Cancer Maternal Aunt        breast  . Cancer Paternal Aunt        non hodgkins lymphoma  . Cancer Maternal Grandmother        breast    ROS: no fevers or chills, productive cough, hemoptysis, dysphasia, odynophagia, melena, hematochezia, dysuria, hematuria, rash, seizure activity, orthopnea, PND, pedal edema, claudication. Remaining systems are negative.  Physical Exam: Well-developed well-nourished in no acute distress.  Skin is warm and dry.  HEENT is normal.  Neck is supple.  Chest is clear to auscultation with normal expansion.  Cardiovascular exam is regular rate and rhythm.  Abdominal exam nontender or distended. No masses palpated. Extremities show no edema. neuro grossly intact  ECG-sinus rhythm at a rate of 76, occasional PVC, no ST changes.  Personally reviewed  A/P  1 palpitations-symptoms are reasonably well controlled.  She is having difficulty obtaining Bystolic.  I will begin Toprol 12.5 mg twice daily to see if she tolerates.  She has had some difficulties with hypotension in the past.  Advance if needed.  2 PVCs-LV function is normal on last echocardiogram.  Continue beta-blocker.  3 history of anxiety-managed by primary care.  Olga Millers, MD

## 2019-08-08 ENCOUNTER — Other Ambulatory Visit: Payer: Self-pay | Admitting: Cardiology

## 2019-08-13 ENCOUNTER — Other Ambulatory Visit: Payer: Self-pay

## 2019-08-13 ENCOUNTER — Encounter: Payer: Self-pay | Admitting: Cardiology

## 2019-08-13 ENCOUNTER — Ambulatory Visit (INDEPENDENT_AMBULATORY_CARE_PROVIDER_SITE_OTHER): Payer: BC Managed Care – PPO | Admitting: Cardiology

## 2019-08-13 VITALS — BP 121/63 | HR 76 | Ht 66.5 in | Wt 135.1 lb

## 2019-08-13 DIAGNOSIS — I493 Ventricular premature depolarization: Secondary | ICD-10-CM

## 2019-08-13 DIAGNOSIS — R002 Palpitations: Secondary | ICD-10-CM

## 2019-08-13 MED ORDER — METOPROLOL SUCCINATE ER 25 MG PO TB24: 12.5000 mg | ORAL_TABLET | Freq: Every day | ORAL | 3 refills | Status: DC

## 2019-08-13 NOTE — Patient Instructions (Signed)
Medication Instructions:  STOP BYSTOLIC  START METOPROLOL SUCC ER 12.5 MG TWICE DAILY= 1/2 OF THE 25 MG TABLET TWICE DAILY  *If you need a refill on your cardiac medications before your next appointment, please call your pharmacy*   Lab Work: If you have labs (blood work) drawn today and your tests are completely normal, you will receive your results only by: Marland Kitchen MyChart Message (if you have MyChart) OR . A paper copy in the mail If you have any lab test that is abnormal or we need to change your treatment, we will call you to review the results.  Follow-Up: At [facility], you and your health needs are our priority.  As part of our continuing mission to provide you with exceptional heart care, we have created designated Provider Care Teams.  These Care Teams include your primary Cardiologist (physician) and Advanced Practice Providers (APPs -  Physician Assistants and Nurse Practitioners) who all work together to provide you with the care you need, when you need it.  We recommend signing up for the patient portal called "MyChart".  Sign up information is provided on this After Visit Summary.  MyChart is used to connect with patients for Virtual Visits (Telemedicine).  Patients are able to view lab/test results, encounter notes, upcoming appointments, etc.  Non-urgent messages can be sent to your provider as well.   To learn more about what you can do with MyChart, go to [email or web address].    Your next appointment:   12 month(s)  The format for your next appointment:   In Person  Provider:   Olga Millers, MD

## 2019-08-14 ENCOUNTER — Encounter: Payer: Self-pay | Admitting: Physician Assistant

## 2019-09-18 MED ORDER — METOPROLOL SUCCINATE ER 25 MG PO TB24: 12.5000 mg | ORAL_TABLET | Freq: Two times a day (BID) | ORAL | 3 refills | Status: DC

## 2019-09-25 ENCOUNTER — Encounter: Payer: Self-pay | Admitting: Physician Assistant

## 2019-09-26 ENCOUNTER — Encounter: Payer: Self-pay | Admitting: Medical-Surgical

## 2019-09-26 ENCOUNTER — Telehealth (INDEPENDENT_AMBULATORY_CARE_PROVIDER_SITE_OTHER): Payer: BC Managed Care – PPO | Admitting: Medical-Surgical

## 2019-09-26 DIAGNOSIS — W57XXXA Bitten or stung by nonvenomous insect and other nonvenomous arthropods, initial encounter: Secondary | ICD-10-CM

## 2019-09-26 DIAGNOSIS — S80862A Insect bite (nonvenomous), left lower leg, initial encounter: Secondary | ICD-10-CM | POA: Diagnosis not present

## 2019-09-26 MED ORDER — HYDROXYZINE HCL 25 MG PO TABS: 25.0000 mg | ORAL_TABLET | Freq: Three times a day (TID) | ORAL | 0 refills | Status: DC | PRN

## 2019-09-26 MED ORDER — DOXYCYCLINE HYCLATE 100 MG PO TABS: 100.0000 mg | ORAL_TABLET | Freq: Two times a day (BID) | ORAL | 0 refills | Status: AC

## 2019-09-26 NOTE — Progress Notes (Signed)
Virtual Visit via Video Note  I connected with Sydney Bailey on 09/26/19 at  1:20 PM EDT by a video enabled telemedicine application and verified that I am speaking with the correct person using two identifiers.   I discussed the limitations of evaluation and management by telemedicine and the availability of in person appointments. The patient expressed understanding and agreed to proceed.  Subjective:    CC: rash on leg, possible insect bite   HPI: Very pleasant 54 year old female presenting today via MyChart video visit to discuss a rash on her leg that has been present for 14 days.  Reports she suspects being bitten by a bug and endorses finding a tick in the bed with her approximately 2 weeks ago.  The tick was not attached when she found it.  The area on the left lower leg along the calf is very itchy, red, and raised.  The center of the erythema has 1 puncture site, scabbed over, without drainage.  She has been using cortisone topically with minimal relief.  No other interventions.  Denies fever, chills, other skin lesions, GI symptoms, fatigue, weakness, or headache.  Past medical history, Surgical history, Family history not pertinant except as noted below, Social history, Allergies, and medications have been entered into the medical record, reviewed, and corrections made.   Review of Systems: No fevers, chills, night sweats, weight loss, chest pain, or shortness of breath.   Objective:    General: Speaking clearly in complete sentences without any shortness of breath.  Alert and oriented x3.  Normal judgment. No apparent acute distress.     Impression and Recommendations:    1. Insect bite of left lower leg, initial encounter Appearance for rash somewhat blotchy indicating more of an allergic reaction but possible cellulitis from a possible insect bite.  Given she found the tick in her bed that coincides with the timing of the onset of rash, feel it necessary to cover for  tick bite as well as cellulitis.  Doxycycline 100 mg twice daily for 10 days.  May use hydroxyzine 25 mg 3 times daily as needed for itching.  Recommend discontinuing topical cortisone as it is not helping it could be further irritating the site. - hydrOXYzine (ATARAX/VISTARIL) 25 MG tablet; Take 1 tablet (25 mg total) by mouth 3 (three) times daily as needed.  Dispense: 30 tablet; Refill: 0 - doxycycline (VIBRA-TABS) 100 MG tablet; Take 1 tablet (100 mg total) by mouth 2 (two) times daily for 10 days.  Dispense: 20 tablet; Refill: 0  I discussed the assessment and treatment plan with the patient. The patient was provided an opportunity to ask questions and all were answered. The patient agreed with the plan and demonstrated an understanding of the instructions.   The patient was advised to call back or seek an in-person evaluation if the symptoms worsen or if the condition fails to improve as anticipated.  Return if symptoms worsen or fail to improve.   20 minutes of non-face-to-face time was provided during this encounter.  Thayer Ohm, DNP, APRN, FNP-BC [facility]

## 2019-10-20 ENCOUNTER — Encounter: Payer: Self-pay | Admitting: *Deleted

## 2020-01-09 LAB — HM PAP SMEAR: HM Pap smear: NEGATIVE

## 2020-02-26 DIAGNOSIS — Z6823 Body mass index (BMI) 23.0-23.9, adult: Secondary | ICD-10-CM | POA: Diagnosis not present

## 2020-02-26 DIAGNOSIS — Z01419 Encounter for gynecological examination (general) (routine) without abnormal findings: Secondary | ICD-10-CM | POA: Diagnosis not present

## 2020-02-26 DIAGNOSIS — Z1231 Encounter for screening mammogram for malignant neoplasm of breast: Secondary | ICD-10-CM | POA: Diagnosis not present

## 2020-03-01 ENCOUNTER — Other Ambulatory Visit: Payer: Self-pay | Admitting: Obstetrics and Gynecology

## 2020-03-01 DIAGNOSIS — R928 Other abnormal and inconclusive findings on diagnostic imaging of breast: Secondary | ICD-10-CM

## 2020-03-03 ENCOUNTER — Other Ambulatory Visit: Payer: Self-pay

## 2020-03-03 ENCOUNTER — Ambulatory Visit
Admission: RE | Admit: 2020-03-03 | Discharge: 2020-03-03 | Disposition: A | Discharge status: Home / Self Care | Payer: BC Managed Care – PPO | Source: Ambulatory Visit | Attending: Obstetrics and Gynecology | Admitting: Obstetrics and Gynecology

## 2020-03-03 ENCOUNTER — Ambulatory Visit: Payer: BC Managed Care – PPO

## 2020-03-03 DIAGNOSIS — R928 Other abnormal and inconclusive findings on diagnostic imaging of breast: Secondary | ICD-10-CM

## 2020-06-18 ENCOUNTER — Encounter: Payer: Self-pay | Admitting: Physician Assistant

## 2020-06-25 ENCOUNTER — Telehealth: Payer: BC Managed Care – PPO | Admitting: Physician Assistant

## 2020-06-30 ENCOUNTER — Encounter: Payer: Self-pay | Admitting: Physician Assistant

## 2020-07-01 ENCOUNTER — Encounter: Payer: Self-pay | Admitting: Nurse Practitioner

## 2020-07-01 ENCOUNTER — Telehealth (INDEPENDENT_AMBULATORY_CARE_PROVIDER_SITE_OTHER): Payer: BC Managed Care – PPO | Admitting: Nurse Practitioner

## 2020-07-01 DIAGNOSIS — B948 Sequelae of other specified infectious and parasitic diseases: Secondary | ICD-10-CM

## 2020-07-01 DIAGNOSIS — R7989 Other specified abnormal findings of blood chemistry: Secondary | ICD-10-CM

## 2020-07-01 DIAGNOSIS — R0600 Dyspnea, unspecified: Secondary | ICD-10-CM | POA: Diagnosis not present

## 2020-07-01 DIAGNOSIS — U099 Post covid-19 condition, unspecified: Secondary | ICD-10-CM

## 2020-07-01 DIAGNOSIS — E039 Hypothyroidism, unspecified: Secondary | ICD-10-CM

## 2020-07-01 DIAGNOSIS — E785 Hyperlipidemia, unspecified: Secondary | ICD-10-CM | POA: Diagnosis not present

## 2020-07-01 DIAGNOSIS — Z131 Encounter for screening for diabetes mellitus: Secondary | ICD-10-CM

## 2020-07-01 DIAGNOSIS — R0602 Shortness of breath: Secondary | ICD-10-CM

## 2020-07-01 MED ORDER — LEVALBUTEROL TARTRATE 45 MCG/ACT IN AERO: 1.0000 | INHALATION_SPRAY | Freq: Four times a day (QID) | RESPIRATORY_TRACT | 1 refills | Status: DC | PRN

## 2020-07-01 MED ORDER — ALBUTEROL SULFATE HFA 108 (90 BASE) MCG/ACT IN AERS
1.0000 | INHALATION_SPRAY | Freq: Four times a day (QID) | RESPIRATORY_TRACT | 3 refills | Status: DC | PRN
Start: 2020-07-01 — End: 2022-05-10

## 2020-07-01 NOTE — Patient Instructions (Signed)
Shortness of Breath, Adult Shortness of breath is when a person has trouble breathing enough air or when a person feels like she or he is having trouble breathing in enough air. Shortness of breath could be a sign of a medical problem. Follow these instructions at home:  Pay attention to any changes in your symptoms.  Do not use any products that contain nicotine or tobacco, such as cigarettes, e-cigarettes, and chewing tobacco.  Do not smoke. Smoking is a common cause of shortness of breath. If you need help quitting, ask your health care provider.  Avoid things that can irritate your airways, such as: ? Mold. ? Dust. ? Air pollution. ? Chemical fumes. ? Things that can cause allergy symptoms (allergens), if you have allergies.  Keep your living space clean and free of mold and dust.  Rest as needed. Slowly return to your usual activities.  Take over-the-counter and prescription medicines only as told by your health care provider. This includes oxygen therapy and inhaled medicines.  Keep all follow-up visits as told by your health care provider. This is important.   Contact a health care provider if:  Your condition does not improve as soon as expected.  You have a hard time doing your normal activities, even after you rest.  You have new symptoms. Get help right away if:  Your shortness of breath gets worse.  You have shortness of breath when you are resting.  You feel light-headed or you faint.  You have a cough that is not controlled with medicines.  You cough up blood.  You have pain with breathing.  You have pain in your chest, arms, shoulders, or abdomen.  You have a fever.  You cannot walk up stairs or exercise the way that you normally do. These symptoms may represent a serious problem that is an emergency. Do not wait to see if the symptoms will go away. Get medical help right away. Call your local emergency services (911 in the [place] Do not drive yourself  to the hospital. Summary  Shortness of breath is when a person has trouble breathing enough air. It can be a sign of a medical problem.  Avoid things that irritate your lungs, such as smoking, pollution, mold, and dust.  Pay attention to changes in your symptoms and contact your health care provider if you have a hard time completing daily activities because of shortness of breath. This information is not intended to replace advice given to you by your health care provider. Make sure you discuss any questions you have with your health care provider. Document Revised: 10/22/2017 Document Reviewed: 10/22/2017 Elsevier Patient Education  2021 Elsevier Inc.  

## 2020-07-01 NOTE — Progress Notes (Signed)
LVM for pt to called office

## 2020-07-01 NOTE — Progress Notes (Signed)
Pt. stated had covid on 1 /13/2022 having concern catching her breath when walking or just doing nothing

## 2020-07-01 NOTE — Progress Notes (Signed)
Virtual Video Visit via MyChart Note-- Converted to telephone visit due to connection issues.   I connected with  Sydney Bailey on 07/01/20 at 11:20 AM EST by the video enabled telemedicine application for , MyChart, and verified that I am speaking with the correct person using two identifiers.   I introduced myself as a Publishing rights manager with the practice. We discussed the limitations of evaluation and management by telemedicine and the availability of in person appointments. The patient expressed understanding and agreed to proceed.  Participating parties in this visit include: The patient and the nurse practitioner listed. Hyman Hopes, DNP, FNP also present observing visit.  The patient is: At home I am: In the office  Subjective:    CC:  Chief Complaint  Patient presents with  . Shortness of Breath    HPI: Sydney Bailey is a 55 y.o. year old female presenting today via MyChart today for persistent shortness of breath after COVID-19 diagnosis about two weeks ago. She reports that this is her second time with COVID, but her symptoms were not as severe with this encounter than the previous. She reports that her symptoms were improving, but she is still experiencing mild fatigue and started to experience shortness of breath this past Sunday. She also reports that she has noticed wheezing with exertion. She does not have a history of asthma.   She denies chest pain, fevers, cough, increased mucous production. She has intermittent palpitations at baseline, but nothing that is worse than her baseline.   She is vaccinated- but no booster. Reports muscle aches and body pain prolonged after both vaccines and does not wish to have a booster due to side effects.   She is also due for her annual labs.   Past medical history, Surgical history, Family history not pertinant except as noted below, Social history, Allergies, and medications have been entered into the medical record, reviewed,  and corrections made.   Review of Systems:  All review of systems negative except what is listed in the HPI   Objective:    General:  Speaking clearly in complete sentences. Absent shortness of breath noted.   Alert and oriented x3.   Normal judgment.  Absent acute distress.   Impression and Recommendations:    1. Persistent shortness of breath after COVID-19 Symptoms and presentation consistent with prolonged shortness of breath after COVID-19 illness. Given that she is still Sydney Bailey in the recovery phase, it is difficult to determine if her symptoms may be related to the healing process of the lungs or a possible ongoing chronic issue that has developed from COVID. No warning signs concerning for PE or pneumonia present at this time.  Will start treatment with levalbuterol as needed to help with symptoms. Avoid albuterol due to palpitations history.  Discussed with patient to try this treatment option for the next 2 weeks to allow time for the lungs to heal and recover. If symptoms are not improved at all, or continue, may need to consider spirometry or chest xray to rule out other causes of symptoms.  Patient agreeable to plan. Follow-up if symptoms worsen or fail to improve.  - levalbuterol (XOPENEX HFA) 45 MCG/ACT inhaler; Inhale 1-2 puffs into the lungs every 6 (six) hours as needed for wheezing or shortness of breath.  Dispense: 15 g; Refill: 1  2. Hypothyroidism, unspecified type Due for labs- meds will need refilled once results are in.  Orders placed.  - Thyroid Panel With TSH; Future  3. Hyperlipidemia  LDL goal <160 Due for labs.  - Lipid Panel w/reflex Direct LDL; Future  4. Low serum progesterone Due for labs- meds will need refilled once results are in.  Orders placed.  - Progesterone; Future  5. Screening for diabetes mellitus Due for labs. - COMPLETE METABOLIC PANEL WITH GFR; Future   Follow-up if symptoms worsen or fail to improve.    I discussed the  assessment and treatment plan with the patient. The patient was provided an opportunity to ask questions and all were answered. The patient agreed with the plan and demonstrated an understanding of the instructions.   The patient was advised to call back or seek an in-person evaluation if the symptoms worsen or if the condition fails to improve as anticipated.  I provided 20 minutes of non-face-to-face interaction with this MYCHART visit including intake, same-day documentation, and chart review.   Tollie Eth, NP

## 2020-07-12 ENCOUNTER — Other Ambulatory Visit: Payer: Self-pay | Admitting: Neurology

## 2020-07-12 DIAGNOSIS — E785 Hyperlipidemia, unspecified: Secondary | ICD-10-CM

## 2020-07-12 DIAGNOSIS — Z131 Encounter for screening for diabetes mellitus: Secondary | ICD-10-CM

## 2020-07-12 DIAGNOSIS — R7989 Other specified abnormal findings of blood chemistry: Secondary | ICD-10-CM

## 2020-07-12 DIAGNOSIS — E039 Hypothyroidism, unspecified: Secondary | ICD-10-CM

## 2020-07-14 DIAGNOSIS — E039 Hypothyroidism, unspecified: Secondary | ICD-10-CM | POA: Diagnosis not present

## 2020-07-14 DIAGNOSIS — R7989 Other specified abnormal findings of blood chemistry: Secondary | ICD-10-CM | POA: Diagnosis not present

## 2020-07-14 DIAGNOSIS — E785 Hyperlipidemia, unspecified: Secondary | ICD-10-CM | POA: Diagnosis not present

## 2020-07-14 DIAGNOSIS — Z131 Encounter for screening for diabetes mellitus: Secondary | ICD-10-CM | POA: Diagnosis not present

## 2020-07-15 LAB — THYROID PANEL WITH TSH
Free Thyroxine Index: 1.3 — ABNORMAL LOW (ref 1.4–3.8)
T3 Uptake: 30 % (ref 22–35)
T4, Total: 4.2 ug/dL — ABNORMAL LOW (ref 5.1–11.9)
TSH: 4.31 mIU/L

## 2020-07-15 LAB — LIPID PANEL W/REFLEX DIRECT LDL
Cholesterol: 253 mg/dL — ABNORMAL HIGH (ref <200)
HDL: 49 mg/dL — ABNORMAL LOW (ref >=50)
LDL Cholesterol (Calc): 167 mg/dL (calc) — ABNORMAL HIGH
Non-HDL Cholesterol (Calc): 204 mg/dL (calc) — ABNORMAL HIGH (ref <130)
Total CHOL/HDL Ratio: 5.2 (calc) — ABNORMAL HIGH (ref <5.0)
Triglycerides: 208 mg/dL — ABNORMAL HIGH (ref <150)

## 2020-07-15 LAB — COMPLETE METABOLIC PANEL WITH GFR
AG Ratio: 1.6 (calc) (ref 1.0–2.5)
ALT: 11 U/L (ref 6–29)
AST: 15 U/L (ref 10–35)
Albumin: 4.2 g/dL (ref 3.6–5.1)
Alkaline phosphatase (APISO): 83 U/L (ref 37–153)
BUN: 13 mg/dL (ref 7–25)
CO2: 28 mmol/L (ref 20–32)
Calcium: 9.3 mg/dL (ref 8.6–10.4)
Chloride: 107 mmol/L (ref 98–110)
Creat: 0.62 mg/dL (ref 0.50–1.05)
GFR, Est African American: 120 mL/min/{1.73_m2} (ref >=60)
GFR, Est Non African American: 104 mL/min/{1.73_m2} (ref >=60)
Globulin: 2.6 g/dL (calc) (ref 1.9–3.7)
Glucose, Bld: 93 mg/dL (ref 65–99)
Potassium: 4.2 mmol/L (ref 3.5–5.3)
Sodium: 141 mmol/L (ref 135–146)
Total Bilirubin: 0.4 mg/dL (ref 0.2–1.2)
Total Protein: 6.8 g/dL (ref 6.1–8.1)

## 2020-07-15 LAB — PROGESTERONE: Progesterone: 5.8 ng/mL

## 2020-07-16 ENCOUNTER — Encounter: Payer: BC Managed Care – PPO | Admitting: Physician Assistant

## 2020-07-16 DIAGNOSIS — E039 Hypothyroidism, unspecified: Secondary | ICD-10-CM | POA: Diagnosis not present

## 2020-07-16 MED ORDER — LEVOTHYROXINE SODIUM 50 MCG PO TABS: 50.0000 ug | ORAL_TABLET | Freq: Every day | ORAL | 3 refills | Status: DC

## 2020-07-16 NOTE — Assessment & Plan Note (Signed)
TSH is in the upper limit of normal but T3 and T4 are low, adding 50 mcg of levothyroxine followed by repeat TSH in 6 weeks, she can follow-up with PCP after 6 weeks to evaluate if there is any improvement in fatigue. Goal TSH less than 2.5.

## 2020-07-16 NOTE — Progress Notes (Signed)
Sydney Bailey,   Labs were drawn at a visit in office.  Kidney, liver, glucose look great.  Cholesterol is not looking great. Your good cholesterol is low and your bad cholesterol is high as well as triglycerides. At this point unless there was some major diet changes happening I would consider a cholesterol lowering drug to help prevent your CV risk.  Your TSH is normal range but to the opposite end of last years and more toward the HYPO thyroid side.   I would love to discuss all this with you virtually or in person. To get a plan for the future.

## 2020-07-16 NOTE — Telephone Encounter (Signed)
I spent 5 total minutes of online digital evaluation and management services. 

## 2020-07-18 ENCOUNTER — Other Ambulatory Visit: Payer: Self-pay | Admitting: Physician Assistant

## 2020-07-18 DIAGNOSIS — F3342 Major depressive disorder, recurrent, in full remission: Secondary | ICD-10-CM

## 2020-07-20 ENCOUNTER — Other Ambulatory Visit: Payer: Self-pay

## 2020-07-20 ENCOUNTER — Encounter: Payer: Self-pay | Admitting: Physician Assistant

## 2020-07-20 ENCOUNTER — Ambulatory Visit: Payer: BC Managed Care – PPO | Admitting: Physician Assistant

## 2020-07-20 VITALS — BP 105/70 | HR 78 | Ht 66.5 in | Wt 158.0 lb

## 2020-07-20 DIAGNOSIS — I493 Ventricular premature depolarization: Secondary | ICD-10-CM

## 2020-07-20 DIAGNOSIS — F3342 Major depressive disorder, recurrent, in full remission: Secondary | ICD-10-CM | POA: Diagnosis not present

## 2020-07-20 DIAGNOSIS — R5383 Other fatigue: Secondary | ICD-10-CM

## 2020-07-20 DIAGNOSIS — E039 Hypothyroidism, unspecified: Secondary | ICD-10-CM | POA: Diagnosis not present

## 2020-07-20 DIAGNOSIS — G43009 Migraine without aura, not intractable, without status migrainosus: Secondary | ICD-10-CM | POA: Diagnosis not present

## 2020-07-20 DIAGNOSIS — E785 Hyperlipidemia, unspecified: Secondary | ICD-10-CM

## 2020-07-20 MED ORDER — AMBULATORY NON FORMULARY MEDICATION: 1 refills | Status: DC

## 2020-07-20 MED ORDER — CITALOPRAM HYDROBROMIDE 40 MG PO TABS
ORAL_TABLET | ORAL | 1 refills | Status: DC
Start: 2020-07-20 — End: 2021-04-27

## 2020-07-20 NOTE — Progress Notes (Addendum)
Subjective:    Patient ID: Sydney Bailey, female    DOB: Dec 04, 1965, 55 y.o.   MRN: [id number]  HPI  Patient is a 55 year old female with hypothyroidism, migraines, depression, PVCs, low serum progesterone who presents to the clinic for follow-up and to discuss labs.  She feels completely exhausted.  She is sleeping okay at night but wakes up feeling tired.  She does not feel like it is necessarily depression.  She does feel like she has had a lot going on but not sad or unmotivated by it.  She denies any significant issues with arrhythmias or palpitations.  Migraines are controlled.    .. Active Ambulatory Problems    Diagnosis Date Noted   Hypothyroidism 11/22/2007   PANIC ATTACK 10/02/2008   Migraine without aura 11/22/2007   MENORRHAGIA 07/27/2008   Palpitations 03/19/2013   Dyspnea 03/19/2013   Depression 08/11/2014   Ventricular quadrigeminy 04/21/2016   PVC's (premature ventricular contractions) 04/21/2016   Low serum progesterone 06/09/2016   Menopause 03/27/2017   ETD (Eustachian tube dysfunction), right 03/27/2017   No energy 10/14/2017   Vertigo 10/14/2017   Hyperlipidemia LDL goal <160 05/07/2018   Persistent shortness of breath after COVID-19 07/01/2020   Recurrent major depressive disorder, in full remission (HCC) 07/20/2020   Resolved Ambulatory Problems    Diagnosis Date Noted   SYNCOPE 02/20/2008   POSTURAL LIGHTHEADEDNESS 12/25/2007   FATIGUE 07/27/2008   Past Medical History:  Diagnosis Date   Allergy    Anxiety    Family history of anesthesia complication    Heart palpitations    Migraines    PONV (postoperative nausea and vomiting)       Review of Systems  All other systems reviewed and are negative.      Objective:   Physical Exam Vitals reviewed.  Constitutional:      Appearance: Normal appearance.  Neck:     Vascular: No carotid bruit.  Cardiovascular:     Rate and Rhythm: Normal rate and regular  rhythm.     Pulses: Normal pulses.  Pulmonary:     Effort: Pulmonary effort is normal.     Breath sounds: Normal breath sounds.  Musculoskeletal:     Right lower leg: No edema.     Left lower leg: No edema.  Neurological:     General: No focal deficit present.     Mental Status: She is alert and oriented to person, place, and time.  Psychiatric:        Mood and Affect: Mood normal.    .. Depression screen [facility] 2/9 07/16/2019 05/07/2018 10/12/2017 03/27/2017  Decreased Interest 0 0 1 0  Down, Depressed, Hopeless 1 0 0 0  PHQ - 2 Score 1 0 1 0  Altered sleeping 1 1 2  -  Tired, decreased energy 1 1 2  -  Change in appetite 1 0 2 -  Feeling bad or failure about yourself  0 0 0 -  Trouble concentrating 0 0 1 -  Moving slowly or fidgety/restless 0 0 0 -  Suicidal thoughts 0 0 0 -  PHQ-9 Score 4 2 8  -  Difficult doing work/chores Not difficult at all Not difficult at all Somewhat difficult -   .. GAD 7 : Generalized Anxiety Score 07/16/2019 05/07/2018 10/12/2017  Nervous, Anxious, on Edge 1 1 1   Control/stop worrying 0 0 0  Worry too much - different things 0 0 0  Trouble relaxing 0 1 1  Restless 0 1 0  Easily  annoyed or irritable 0 1 0  Afraid - awful might happen 0 1 0  Total GAD 7 Score 1 5 2   Anxiety Difficulty Not difficult at all Not difficult at all Not difficult at all           Assessment & Plan:  Marland KitchenShantavia was seen today for follow-up.  Diagnoses and all orders for this visit:  Hypothyroidism, unspecified type -     B12 and Folate Panel -     VITAMIN D 25 Hydroxy (Vit-D Deficiency, Fractures) -     Thyroid Panel With TSH -     CBC with Differential/Platelet -     Fe+TIBC+Fer -     AMBULATORY NON FORMULARY MEDICATION; T4 Dennison Bulla and T3 40 mcg NON PORCINE SR capsules.  Migraine without aura and without status migrainosus, not intractable  Ventricular quadrigeminy -     B12 and Folate Panel -     VITAMIN D 25 Hydroxy (Vit-D Deficiency, Fractures) -     Thyroid  Panel With TSH -     CBC with Differential/Platelet -     Fe+TIBC+Fer  PVC's (premature ventricular contractions) -     B12 and Folate Panel -     VITAMIN D 25 Hydroxy (Vit-D Deficiency, Fractures) -     Thyroid Panel With TSH -     CBC with Differential/Platelet -     Fe+TIBC+Fer  Recurrent major depressive disorder, in full remission (HCC) -     citalopram (CELEXA) 40 MG tablet; TAKE 1 TABLET BY MOUTH EVERY DAY.  No energy -     B12 and Folate Panel -     VITAMIN D 25 Hydroxy (Vit-D Deficiency, Fractures) -     Thyroid Panel With TSH -     CBC with Differential/Platelet -     Fe+TIBC+Fer  Hyperlipidemia LDL goal <160   Vitals look great. PHQ GAD numbers are stable and not out of control. Reviewed labs with patient.  Her thyroid did make a big jump from 1 year ago.  Her TSH was 0.5 and now her TSH is 4.5.  She is on natural compounded thyroid medication.  I called [facility] and suggested a 20 MCG increase.  This was sent to the pharmacy.  Labs are pending to recheck in 4 to 6 weeks with other labs to more fully evaluate fatigue.  Discussed with patient her LDL level had jumped quite a bit up but her CV risk is still 1.5%.  Will not start a statin but did suggest weight loss and exercise.  Over the past year she has gained the weight she previously lost.  Suggested her get back on track with a healthier diet.  Spent 30 minutes with patient discussing treatment plan, medications, weight loss.

## 2020-07-22 ENCOUNTER — Other Ambulatory Visit: Payer: Self-pay | Admitting: Neurology

## 2020-07-22 DIAGNOSIS — E039 Hypothyroidism, unspecified: Secondary | ICD-10-CM

## 2020-07-22 MED ORDER — AMBULATORY NON FORMULARY MEDICATION: 1 refills | Status: DC

## 2020-07-22 NOTE — Progress Notes (Signed)
RX sent to [facility] with no sig. RX reordered and will send with updated sig.

## 2020-07-29 ENCOUNTER — Encounter: Payer: Self-pay | Admitting: Neurology

## 2020-08-09 ENCOUNTER — Other Ambulatory Visit: Payer: Self-pay | Admitting: Neurology

## 2020-08-09 DIAGNOSIS — Z780 Asymptomatic menopausal state: Secondary | ICD-10-CM

## 2020-08-09 DIAGNOSIS — R7989 Other specified abnormal findings of blood chemistry: Secondary | ICD-10-CM

## 2020-08-09 MED ORDER — AMBULATORY NON FORMULARY MEDICATION: 3 refills | Status: DC

## 2020-08-09 NOTE — Progress Notes (Signed)
Patient left vm requesting medication to be sent. RX faxed to [facility].

## 2020-08-12 ENCOUNTER — Encounter: Payer: Self-pay | Admitting: Physician Assistant

## 2020-08-13 ENCOUNTER — Telehealth (INDEPENDENT_AMBULATORY_CARE_PROVIDER_SITE_OTHER): Payer: BC Managed Care – PPO | Admitting: Family Medicine

## 2020-08-13 ENCOUNTER — Encounter: Payer: Self-pay | Admitting: Family Medicine

## 2020-08-13 DIAGNOSIS — F419 Anxiety disorder, unspecified: Secondary | ICD-10-CM | POA: Diagnosis not present

## 2020-08-13 DIAGNOSIS — F410 Panic disorder [episodic paroxysmal anxiety] without agoraphobia: Secondary | ICD-10-CM

## 2020-08-13 MED ORDER — ALPRAZOLAM 0.5 MG PO TABS: ORAL_TABLET | ORAL | 0 refills | Status: DC

## 2020-08-13 NOTE — Patient Instructions (Signed)
[email or web address].NIH.Gov">  Generalized Anxiety Disorder, Adult Generalized anxiety disorder (GAD) is a mental health condition. Unlike normal worries, anxiety related to GAD is not triggered by a specific event. These worries do not fade or get better with time. GAD interferes with relationships, work, and school. GAD symptoms can vary from mild to severe. People with severe GAD can have intense waves of anxiety with physical symptoms that are similar to panic attacks. What are the causes? The exact cause of GAD is not known, but the following are believed to have an impact:  Differences in natural brain chemicals.  Genes passed down from parents to children.  Differences in the way threats are perceived.  Development during childhood.  Personality. What increases the risk? The following factors may make you more likely to develop this condition:  Being female.  Having a family history of anxiety disorders.  Being very shy.  Experiencing very stressful life events, such as the death of a loved one.  Having a very stressful family environment. What are the signs or symptoms? People with GAD often worry excessively about many things in their lives, such as their health and family. Symptoms may also include:  Mental and emotional symptoms: ? Worrying excessively about natural disasters. ? Fear of being late. ? Difficulty concentrating. ? Fears that others are judging your performance.  Physical symptoms: ? Fatigue. ? Headaches, muscle tension, muscle twitches, trembling, or feeling shaky. ? Feeling like your heart is pounding or beating very fast. ? Feeling out of breath or like you cannot take a deep breath. ? Having trouble falling asleep or staying asleep, or experiencing restlessness. ? Sweating. ? Nausea, diarrhea, or irritable bowel syndrome (IBS).  Behavioral symptoms: ? Experiencing erratic moods or irritability. ? Avoidance of new situations. ? Avoidance of  people. ? Extreme difficulty making decisions. How is this diagnosed? This condition is diagnosed based on your symptoms and medical history. You will also have a physical exam. Your health care provider may perform tests to rule out other possible causes of your symptoms. To be diagnosed with GAD, a person must have anxiety that:  Is out of his or her control.  Affects several different aspects of his or her life, such as work and relationships.  Causes distress that makes him or her unable to take part in normal activities.  Includes at least three symptoms of GAD, such as restlessness, fatigue, trouble concentrating, irritability, muscle tension, or sleep problems. Before your health care provider can confirm a diagnosis of GAD, these symptoms must be present more days than they are not, and they must last for 6 months or longer. How is this treated? This condition may be treated with:  Medicine. Antidepressant medicine is usually prescribed for long-term daily control. Anti-anxiety medicines may be added in severe cases, especially when panic attacks occur.  Talk therapy (psychotherapy). Certain types of talk therapy can be helpful in treating GAD by providing support, education, and guidance. Options include: ? Cognitive behavioral therapy (CBT). People learn coping skills and self-calming techniques to ease their physical symptoms. They learn to identify unrealistic thoughts and behaviors and to replace them with more appropriate thoughts and behaviors. ? Acceptance and commitment therapy (ACT). This treatment teaches people how to be mindful as a way to cope with unwanted thoughts and feelings. ? Biofeedback. This process trains you to manage your body's response (physiological response) through breathing techniques and relaxation methods. You will work with a therapist while machines are used to monitor your physical   symptoms.  Stress management techniques. These include yoga,  meditation, and exercise. A mental health specialist can help determine which treatment is best for you. Some people see improvement with one type of therapy. However, other people require a combination of therapies.   Follow these instructions at home: Lifestyle  Maintain a consistent routine and schedule.  Anticipate stressful situations. Create a plan, and allow extra time to work with your plan.  Practice stress management or self-calming techniques that you have learned from your therapist or your health care provider. General instructions  Take over-the-counter and prescription medicines only as told by your health care provider.  Understand that you are likely to have setbacks. Accept this and be kind to yourself as you persist to take better care of yourself.  Recognize and accept your accomplishments, even if you judge them as small.  Keep all follow-up visits as told by your health care provider. This is important. Contact a health care provider if:  Your symptoms do not get better.  Your symptoms get worse.  You have signs of depression, such as: ? A persistently sad or irritable mood. ? Loss of enjoyment in activities that used to bring you joy. ? Change in weight or eating. ? Changes in sleeping habits. ? Avoiding friends or family members. ? Loss of energy for normal tasks. ? Feelings of guilt or worthlessness. Get help right away if:  You have serious thoughts about hurting yourself or others. If you ever feel like you may hurt yourself or others, or have thoughts about taking your own life, get help right away. Go to your nearest emergency department or:  Call your local emergency services (911 in the [place]  Call a suicide crisis helpline, such as the National Suicide Prevention Lifeline at 1-800-273-8255. This is open 24 hours a day in the [place]  Text the Crisis Text Line at 741741 (in the [place] Summary  Generalized anxiety disorder (GAD) is a mental  health condition that involves worry that is not triggered by a specific event.  People with GAD often worry excessively about many things in their lives, such as their health and family.  GAD may cause symptoms such as restlessness, trouble concentrating, sleep problems, frequent sweating, nausea, diarrhea, headaches, and trembling or muscle twitching.  A mental health specialist can help determine which treatment is best for you. Some people see improvement with one type of therapy. However, other people require a combination of therapies. This information is not intended to replace advice given to you by your health care provider. Make sure you discuss any questions you have with your health care provider. Document Revised: 03/12/2019 Document Reviewed: 03/12/2019 Elsevier Patient Education  2021 Elsevier Inc.  

## 2020-08-13 NOTE — Progress Notes (Signed)
Virtual Visit via Telephone Note  I connected with  Sydney Bailey on 08/13/20 at  9:10 AM EST by telephone and verified that I am speaking with the correct person using two identifiers.   I discussed the limitations, risks, security and privacy concerns of performing an evaluation and management service by telephone and the availability of in person appointments. I also discussed with the patient that there may be a patient responsible charge related to this service. The patient expressed understanding and agreed to proceed.  Participating parties included in this telephone visit include: The patient and the nurse practitioner listed.  The patient is: At home I am: In the office  Subjective:    CC: anxiety  HPI: Sydney Bailey is a 55 y.o. year old female presenting today via telephone visit to discuss anxiety.  Patient reports she had several stressors over the last several years, one major stressor being a family situation. She reports she has been managing fine over the past several years with her citalopram 40 mg, but some new information regarding her family situation came out yesterday and it is causing her a lot of anxiety. She reports history of panic attacks and is afraid of letting that take over her thoughts. States she used Xanax many years ago during other stressful events and she was able to get relief with a very low dose used infrequently. She would like to have something on hand as this most recent stressor is intensifying. She reports she does have a good support system, her husband is a Education officer, environmental and she has the name of a counselor she is planning to reach out to. States she is just very overwhelmed and her anxiety has skyrocketed since hearing the most recent news regarding her family situation.    Past medical history, Surgical history, Family history not pertinant except as noted below, Social history, Allergies, and medications have been entered into the medical record,  reviewed, and corrections made.   Review of Systems:  All review of systems negative except what is listed in the HPI  Objective:    General:  Patient speaking clearly in complete sentences. No shortness of breath noted.   Alert and oriented x3.   Normal judgment.  No apparent acute distress. Patient is audibly anxious and tearful during call.  Impression and Recommendations:     1. PANIC ATTACK 2. Anxiety Patient with recent increase in anxiety and panic attacks from a specific trigger that will likely continue causing anxiety for her during this season of life. She reports various allergies and drug interaction problems in the past, so she is afraid to try anything new. We did discuss hydroxyzine as an alternative to xanax, but given that she has used the xanax successfully in the past, she prefers to stick with it. Educated on safety of medication and limited supply that would be prescribed to her without refills. She is planning to reach out to a counselor she knows and is aware of other stress management techniques. Encouraged her to follow-up with [place] if her symptoms worsen.  - ALPRAZolam (XANAX) 0.5 MG tablet; Take 0.5-1 tablet up to three times daily as needed for anxiety  Dispense: 15 tablet; Refill: 0  Follow-up if symptoms worsen or fail to improve.   I discussed the assessment and treatment plan with the patient. The patient was provided an opportunity to ask questions and all were answered. The patient agreed with the plan and demonstrated an understanding of the instructions.  The patient was advised to call back or seek an in-person evaluation if the symptoms worsen or if the condition fails to improve as anticipated.  I provided 20 minutes of non-face-to-face time during this TELEPHONE encounter.    Clayborne Dana, NP

## 2020-09-08 ENCOUNTER — Telehealth: Payer: Self-pay | Admitting: Neurology

## 2020-09-08 DIAGNOSIS — R5383 Other fatigue: Secondary | ICD-10-CM | POA: Diagnosis not present

## 2020-09-08 DIAGNOSIS — E039 Hypothyroidism, unspecified: Secondary | ICD-10-CM | POA: Diagnosis not present

## 2020-09-08 DIAGNOSIS — I493 Ventricular premature depolarization: Secondary | ICD-10-CM | POA: Diagnosis not present

## 2020-09-08 NOTE — Telephone Encounter (Signed)
Patient left vm wanting to know if labs need to be fasting, made her aware these do not.

## 2020-09-09 ENCOUNTER — Encounter: Payer: Self-pay | Admitting: Physician Assistant

## 2020-09-09 DIAGNOSIS — E039 Hypothyroidism, unspecified: Secondary | ICD-10-CM

## 2020-09-09 LAB — CBC WITH DIFFERENTIAL/PLATELET
Absolute Monocytes: 469 cells/uL (ref 200–950)
Basophils Absolute: 48 cells/uL (ref 0–200)
Basophils Relative: 0.7 %
Eosinophils Absolute: 69 cells/uL (ref 15–500)
Eosinophils Relative: 1 %
HCT: 40.8 % (ref 35.0–45.0)
Hemoglobin: 13.6 g/dL (ref 11.7–15.5)
Lymphs Abs: 2408 cells/uL (ref 850–3900)
MCH: 31.1 pg (ref 27.0–33.0)
MCHC: 33.3 g/dL (ref 32.0–36.0)
MCV: 93.2 fL (ref 80.0–100.0)
MPV: 10 fL (ref 7.5–12.5)
Monocytes Relative: 6.8 %
Neutro Abs: 3905 cells/uL (ref 1500–7800)
Neutrophils Relative %: 56.6 %
Platelets: 297 10*3/uL (ref 140–400)
RBC: 4.38 10*6/uL (ref 3.80–5.10)
RDW: 12.7 % (ref 11.0–15.0)
Total Lymphocyte: 34.9 %
WBC: 6.9 10*3/uL (ref 3.8–10.8)

## 2020-09-09 LAB — THYROID PANEL WITH TSH
Free Thyroxine Index: 1.4 (ref 1.4–3.8)
T3 Uptake: 30 % (ref 22–35)
T4, Total: 4.7 ug/dL — ABNORMAL LOW (ref 5.1–11.9)
TSH: 4.35 mIU/L

## 2020-09-09 LAB — IRON,TIBC AND FERRITIN PANEL
%SAT: 32 % (calc) (ref 16–45)
Ferritin: 25 ng/mL (ref 16–232)
Iron: 116 ug/dL (ref 45–160)
TIBC: 357 mcg/dL (calc) (ref 250–450)

## 2020-09-09 NOTE — Progress Notes (Signed)
Sydney Bailey,   Iron looks great.  Iron stores a little low. Increase iron rich foods.  TSH in normal range but upper limits of normal meaning more on the HYPO side of normal. Thoughts on another increase in thyroid medication if you are having hypo thyroid symptoms. Typically goal TSH range is 1-2.

## 2020-09-10 ENCOUNTER — Telehealth: Payer: Self-pay | Admitting: Physician Assistant

## 2020-09-10 MED ORDER — AMBULATORY NON FORMULARY MEDICATION: 0 refills | Status: DC

## 2020-09-10 NOTE — Telephone Encounter (Signed)
Spoke with pharmacist, she recommends T4 190 mcg, T3 50 mcg.

## 2020-09-10 NOTE — Addendum Note (Signed)
Addended bySilvio Pate on: 09/10/2020 09:39 AM   Modules accepted: Orders

## 2020-09-10 NOTE — Telephone Encounter (Signed)
Tried to call [facility] and pharmacist in a meeting until 10 am. Will call back.   Currently taking T4 160 mcg T3 40 mcg NON PORCINE SR 1 capsule daily.

## 2020-09-10 NOTE — Telephone Encounter (Signed)
RX faxed to [facility] at [phone] with confirmation received.  

## 2020-10-06 ENCOUNTER — Encounter: Payer: Self-pay | Admitting: Physician Assistant

## 2020-10-11 ENCOUNTER — Encounter: Payer: Self-pay | Admitting: Physician Assistant

## 2020-10-11 ENCOUNTER — Other Ambulatory Visit: Payer: Self-pay

## 2020-10-11 ENCOUNTER — Telehealth (INDEPENDENT_AMBULATORY_CARE_PROVIDER_SITE_OTHER): Payer: BC Managed Care – PPO | Admitting: Physician Assistant

## 2020-10-11 ENCOUNTER — Ambulatory Visit (INDEPENDENT_AMBULATORY_CARE_PROVIDER_SITE_OTHER): Payer: BC Managed Care – PPO

## 2020-10-11 DIAGNOSIS — M5441 Lumbago with sciatica, right side: Secondary | ICD-10-CM | POA: Diagnosis not present

## 2020-10-11 DIAGNOSIS — M25551 Pain in right hip: Secondary | ICD-10-CM | POA: Diagnosis not present

## 2020-10-11 DIAGNOSIS — G8929 Other chronic pain: Secondary | ICD-10-CM | POA: Diagnosis not present

## 2020-10-11 DIAGNOSIS — M545 Low back pain, unspecified: Secondary | ICD-10-CM | POA: Diagnosis not present

## 2020-10-11 MED ORDER — TRAMADOL HCL 50 MG PO TABS: 50.0000 mg | ORAL_TABLET | Freq: Three times a day (TID) | ORAL | 0 refills | Status: AC | PRN

## 2020-10-11 NOTE — Progress Notes (Signed)
Pt having lower back and right hip pain. Pt states she is going to [place] next month so wanted to get it checked out before then.

## 2020-10-11 NOTE — Telephone Encounter (Signed)
Patient has been virtually scheduled with PCP.
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
## 2020-10-12 MED ORDER — METAXALONE 800 MG PO TABS: 800.0000 mg | ORAL_TABLET | Freq: Three times a day (TID) | ORAL | 0 refills | Status: DC | PRN

## 2020-10-13 ENCOUNTER — Other Ambulatory Visit: Payer: Self-pay

## 2020-10-13 ENCOUNTER — Ambulatory Visit (INDEPENDENT_AMBULATORY_CARE_PROVIDER_SITE_OTHER): Payer: BC Managed Care – PPO | Admitting: Rehabilitative and Restorative Service Providers"

## 2020-10-13 ENCOUNTER — Encounter: Payer: Self-pay | Admitting: Physician Assistant

## 2020-10-13 DIAGNOSIS — G8929 Other chronic pain: Secondary | ICD-10-CM

## 2020-10-13 DIAGNOSIS — M5136 Other intervertebral disc degeneration, lumbar region: Secondary | ICD-10-CM | POA: Insufficient documentation

## 2020-10-13 DIAGNOSIS — M545 Low back pain, unspecified: Secondary | ICD-10-CM | POA: Diagnosis not present

## 2020-10-13 DIAGNOSIS — R29898 Other symptoms and signs involving the musculoskeletal system: Secondary | ICD-10-CM

## 2020-10-13 DIAGNOSIS — M6281 Muscle weakness (generalized): Secondary | ICD-10-CM | POA: Diagnosis not present

## 2020-10-13 DIAGNOSIS — M51369 Other intervertebral disc degeneration, lumbar region without mention of lumbar back pain or lower extremity pain: Secondary | ICD-10-CM | POA: Insufficient documentation

## 2020-10-13 DIAGNOSIS — M47816 Spondylosis without myelopathy or radiculopathy, lumbar region: Secondary | ICD-10-CM | POA: Insufficient documentation

## 2020-10-13 NOTE — Patient Instructions (Addendum)
  Access Code: [email or web address]: [email or web address]: 05/11/2022Prepared by: Rahul Malinak HoltExercises  Prone Press Up - 2 x daily - 7 x weekly - 1 sets - 10 reps - 2-3 sec hold  Supine Piriformis Stretch with Leg Straight - 2 x daily - 7 x weekly - 1 sets - 3 reps - 30 sec hold  Supine Transversus Abdominis Bracing with Pelvic Floor Contraction - 2 x daily - 7 x weekly - 1 sets - 10 reps - 10sec hold Patient Education  Hospital doctor  Trigger Point Dry Needling

## 2020-10-13 NOTE — Progress Notes (Signed)
Besse,   Right hip joint looks great.
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
## 2020-10-13 NOTE — Progress Notes (Signed)
[place],   Mild scoliosis. Arthritis in the facets. Degenerative disc changes at L2 and L3. No acute concerning changes. Treatment plan is appropriate.

## 2020-10-20 ENCOUNTER — Encounter: Payer: Self-pay | Admitting: Physical Therapy

## 2020-10-20 ENCOUNTER — Ambulatory Visit (INDEPENDENT_AMBULATORY_CARE_PROVIDER_SITE_OTHER): Payer: BC Managed Care – PPO | Admitting: Physical Therapy

## 2020-10-20 ENCOUNTER — Telehealth: Payer: Self-pay | Admitting: Cardiology

## 2020-10-20 ENCOUNTER — Other Ambulatory Visit: Payer: Self-pay

## 2020-10-20 DIAGNOSIS — M545 Low back pain, unspecified: Secondary | ICD-10-CM | POA: Diagnosis not present

## 2020-10-20 NOTE — Patient Instructions (Addendum)
  Access Code: [email or web address] URL: [email or web address] Date: 10/20/2020 Prepared by: [facility] [address]  Exercises Prone Press Up - 2 x daily - 7 x weekly - 1 sets - 10 reps - 2-3 sec hold Supine Piriformis Stretch with Leg Straight - 2 x daily - 7 x weekly - 1 sets - 3 reps - 30 sec hold Supine Transversus Abdominis Bracing with Pelvic Floor Contraction - 2 x daily - 7 x weekly - 1 sets - 10 reps - 10sec hold Bridge - 1 x daily - 7 x weekly - 1 sets - 10 reps

## 2020-10-20 NOTE — Telephone Encounter (Signed)
New Message:      Pt wants Dr Jens Som to know she will be sending a form for him to fill out please. She donates Plasma and she needs this form filled out to be authorized to do this.

## 2020-10-20 NOTE — Therapy (Signed)
[facility] [place] [address] [place], [place], [address] Phone: [phone]   Fax:  [phone]  Physical Therapy Treatment  Patient Details  Name: Sydney Bailey MRN: [id number] Date of Birth: 11-Feb-1966 Referring Provider (PT): Tandy Gaw, [place]   Encounter Date: 10/20/2020   PT End of Session - 10/20/20 1351    Visit Number 2    Number of Visits 12    Date for PT Re-Evaluation 11/24/20    PT Start Time 1349    PT Stop Time 1430    PT Time Calculation (min) 41 min    Activity Tolerance Patient tolerated treatment well    Behavior During Therapy [facility] for tasks assessed/performed           Past Medical History:  Diagnosis Date  . Allergy   . Anxiety   . Depression   . Family history of anesthesia complication    Father had difficult time waking up   . Heart palpitations    PVC's  . Hypothyroidism 11-08   started post- partum  . Migraines   . PONV (postoperative nausea and vomiting)    Difficult waking up; had bad migraine after bunionectomy  . PVC's (premature ventricular contractions)    Pt saw Dr. Jens Som for this but pt stated it was fine    Past Surgical History:  Procedure Laterality Date  . BUNIONECTOMY Bilateral    x 2 on both feet  . CHOLECYSTECTOMY N/A 05/22/2013   Procedure: LAPAROSCOPIC CHOLECYSTECTOMY;  Surgeon: Shelly Rubenstein, MD;  Location: [facility] OR;  Service: General;  Laterality: N/A;  . essure BTL    . UPPER GI ENDOSCOPY      There were no vitals filed for this visit.   Subjective Assessment - 10/20/20 1351    Subjective Pt reports that her back hasn't "gone out" any since last visit.  She has been doing stretches at least 1x/daily. She has had some soreness after each completion.    Patient Stated Goals prevent increase in back pain during travel next month    Currently in Pain? No/denies    Pain Score 0-No pain    Pain Location Back              [facility] PT Assessment -  10/20/20 0001      Assessment   Medical Diagnosis LBP Rt posterior hip/thigh pain    Referring Provider (PT) Tandy Gaw, PA-C    Onset Date/Surgical Date 09/26/20   history of LBP x 30 yrs   Hand Dominance Right    Next MD Visit PRN    Prior Therapy chiropractic care several years ago             [facility] Adult PT Treatment/Exercise - 10/20/20 0001      Lumbar Exercises: Stretches   Passive Hamstring Stretch Right;Left;2 reps;30 seconds    Hip Flexor Stretch Right;Left;2 reps;20 seconds   seated with leg back.   Press Ups 5 reps and 5 reps of POE    Piriformis Stretch Left;Right;2 reps;20 seconds   supine travell     Lumbar Exercises: Seated   Sit to Stand 5 reps   TA engaged.   Other Seated Lumbar Exercises sit to/from supine via log roll x 3 reps      Lumbar Exercises: Supine   Ab Set 10 reps;5 seconds    AB Set Limitations in sitting, with lap press, and in supine with tactile cues.    Bridge 10 reps;2 seconds  NuStep L5: 5 min, LE only        PT Education - 10/20/20 1604    Education Details HEP - added bridge and hip flexor stretch.  Initiated posture / body mechanics education and importance of TA contraction with transitional movements.    Person(s) Educated Patient    Methods Explanation;Handout;Demonstration;Verbal cues    Comprehension Verbalized understanding;Returned demonstration               PT Long Term Goals - 10/13/20 1706      PT LONG TERM GOAL #1   Title Decrease pain in LB and Rt posterior hip by 50-75%    Time 6    Period Weeks    Status New    Target Date 11/24/20      PT LONG TERM GOAL #2   Title Improve core strength and stability allowing patient to preform normal functional activities without flare up of LBP and Rt posterior hip pain    Time 6    Period Weeks    Status New    Target Date 11/24/20      PT LONG TERM GOAL #3   Title Patient to verbalize and demonstrate good body mechanics for ADL's; lifting;  transfers; transitioinal movements    Time 6    Period Weeks    Status New    Target Date 11/24/20      PT LONG TERM GOAL #4   Title Independent in HEP    Time 6    Period Weeks    Status New    Target Date 11/24/20      PT LONG TERM GOAL #5   Title Improve Functional limitation score to 66    Time 6    Period Weeks    Status New    Target Date 11/24/20                 Plan - 10/20/20 1422    Clinical Impression Statement Pt reporting positive response to exercises, reporting less soreness in hips after completion of piriformis stretch.  Reviewed current HEP and gave cues for proper form of exercises.  Pt tolerated all exercises well today without production of pain.  Pt will return next week as she is going to [place].  Encouraged pt to utilize lumbar support in car, bring TENS, and continue daily HEP.  Goals are ongoing.    Stability/Clinical Decision Making Stable/Uncomplicated    Rehab Potential Good    PT Frequency 2x / week    PT Duration 6 weeks    PT Treatment/Interventions ADLs/Self Care Home Management;Aquatic Therapy;Cryotherapy;Electrical Stimulation;Iontophoresis 4mg /ml Dexamethasone;Moist Heat;Ultrasound;Functional mobility training;Therapeutic activities;Therapeutic exercise;Balance training;Neuromuscular re-education;Patient/family education;Manual techniques;Dry needling;Taping    PT Next Visit Plan progress HEP; continue spine care education; ergonomic education; trial of DN and/or manual work Rt posterior hip/LB(tight piriformis/gluts/QL) progress with core strengthening and stabilization; modalities as indicated (pt has TENS unit at home)    PT Home Exercise Plan [email or web address]    Consulted and Agree with Plan of Care Patient           Patient will benefit from skilled therapeutic intervention in order to improve the following deficits and impairments:  Decreased range of motion,Pain,Decreased activity tolerance,Hypomobility,Impaired  flexibility,Improper body mechanics,Decreased mobility,Decreased strength,Postural dysfunction  Visit Diagnosis: Acute bilateral low back pain without sciatica     Problem List Patient Active Problem List   Diagnosis Date Noted  . DDD (degenerative disc disease), lumbar 10/13/2020  . Facet arthritis of lumbar region 10/13/2020  .  Right hip pain 10/11/2020  . Chronic right-sided low back pain with right-sided sciatica 10/11/2020  . Recurrent major depressive disorder, in full remission (HCC) 07/20/2020  . Persistent shortness of breath after COVID-19 07/01/2020  . Hyperlipidemia LDL goal <160 05/07/2018  . No energy 10/14/2017  . Vertigo 10/14/2017  . Menopause 03/27/2017  . ETD (Eustachian tube dysfunction), right 03/27/2017  . Low serum progesterone 06/09/2016  . Ventricular quadrigeminy 04/21/2016  . PVC's (premature ventricular contractions) 04/21/2016  . Depression 08/11/2014  . Palpitations 03/19/2013  . Dyspnea 03/19/2013  . PANIC ATTACK 10/02/2008  . MENORRHAGIA 07/27/2008  . Hypothyroidism 11/22/2007  . Migraine without aura 11/22/2007   Mayer Camel, PTA 10/20/20 4:06 PM  [facility] [place] [address] [place], [place], [address] Phone: [phone]   Fax:  [phone]  Name: ESTEL TONELLI MRN: [id number] Date of Birth: 1965-08-03

## 2020-10-20 NOTE — Telephone Encounter (Signed)
Returned call to patient, who states that she had form faxed to the [facility] office for Dr. Jens Som to fill out the 2nd page, and then form will need to be faxed back to number provided on the form.   Advised patient that I would forward message to Dr. Ludwig Clarks nurse but that he was not in office today. Patient verbalized understanding.

## 2020-10-22 NOTE — Telephone Encounter (Signed)
Spoke with pt, questions on paperwork discussed with the patient. Aware will get signed and faxed in today.

## 2020-10-25 ENCOUNTER — Encounter: Payer: BC Managed Care – PPO | Admitting: Rehabilitative and Restorative Service Providers"

## 2020-10-26 ENCOUNTER — Other Ambulatory Visit: Payer: Self-pay | Admitting: Cardiology

## 2020-10-26 DIAGNOSIS — R002 Palpitations: Secondary | ICD-10-CM

## 2020-10-27 ENCOUNTER — Encounter: Payer: Self-pay | Admitting: Rehabilitative and Restorative Service Providers"

## 2020-10-27 ENCOUNTER — Other Ambulatory Visit: Payer: Self-pay

## 2020-10-27 ENCOUNTER — Ambulatory Visit (INDEPENDENT_AMBULATORY_CARE_PROVIDER_SITE_OTHER): Payer: BC Managed Care – PPO | Admitting: Rehabilitative and Restorative Service Providers"

## 2020-10-27 DIAGNOSIS — R29898 Other symptoms and signs involving the musculoskeletal system: Secondary | ICD-10-CM | POA: Diagnosis not present

## 2020-10-27 DIAGNOSIS — M545 Low back pain, unspecified: Secondary | ICD-10-CM

## 2020-10-27 DIAGNOSIS — G8929 Other chronic pain: Secondary | ICD-10-CM

## 2020-10-27 DIAGNOSIS — M6281 Muscle weakness (generalized): Secondary | ICD-10-CM | POA: Diagnosis not present

## 2020-10-27 NOTE — Therapy (Signed)
[facility] [place] [address] [place], [place], [address] Phone: [phone]   Fax:  [phone]  Physical Therapy Treatment  Patient Details  Name: Sydney Bailey MRN: [id number] Date of Birth: [birth year] Referring Provider (PT): Tandy Gaw, [place]   Encounter Date: 10/27/2020   PT End of Session - 10/27/20 1026    Visit Number 3    Number of Visits 12    Date for PT Re-Evaluation 11/24/20    PT Start Time 1025   pt late for appt   PT Stop Time 1113    PT Time Calculation (min) 48 min           Past Medical History:  Diagnosis Date  . Allergy   . Anxiety   . Depression   . Family history of anesthesia complication    Father had difficult time waking up   . Heart palpitations    PVC's  . Hypothyroidism 11-08   started post- partum  . Migraines   . PONV (postoperative nausea and vomiting)    Difficult waking up; had bad migraine after bunionectomy  . PVC's (premature ventricular contractions)    Pt saw Dr. Jens Som for this but pt stated it was fine    Past Surgical History:  Procedure Laterality Date  . BUNIONECTOMY Bilateral    x 2 on both feet  . CHOLECYSTECTOMY N/A 05/22/2013   Procedure: LAPAROSCOPIC CHOLECYSTECTOMY;  Surgeon: Shelly Rubenstein, MD;  Location: [facility] OR;  Service: General;  Laterality: N/A;  . essure BTL    . UPPER GI ENDOSCOPY      There were no vitals filed for this visit.   Subjective Assessment - 10/27/20 1026    Subjective Patient reports that she had trouble travelling. She has pain with sitting and driving. Pain up to 4-5/10 when out of town. She did some of the exercises.    Currently in Pain? Yes    Pain Score 1     Pain Location Back    Pain Orientation Right;Lower    Pain Descriptors / Indicators Nagging;Sharp    Pain Type Acute pain;Chronic pain              [facility] PT Assessment - 10/27/20 0001      Assessment   Medical Diagnosis LBP Rt posterior hip/thigh  pain    Referring Provider (PT) Tandy Gaw, PA-C    Onset Date/Surgical Date 09/26/20    Hand Dominance Right    Next MD Visit PRN    Prior Therapy chiropractic care several years ago      Flexibility   Hamstrings decreased tightness    Piriformis decreased tightness      Palpation   Palpation comment muscular tightness Rt> Lt QL/lats; piriformis; gluts and Rt psoas                         [facility] Adult PT Treatment/Exercise - 10/27/20 0001      Lumbar Exercises: Stretches   Passive Hamstring Stretch Right;Left;2 reps;30 seconds    Hip Flexor Stretch Right;Left;2 reps;20 seconds   seated with leg back.   Press Ups 10 reps   2-3 min   Piriformis Stretch Left;Right;3 reps;30 seconds   supine travell     Lumbar Exercises: Seated   Sit to Stand 10 reps   VC for hinged hip; core engaged     Lumbar Exercises: Supine   AB Set Limitations 3 part core 10 sec hold  x 10 TC for correct technique; VC to breathe    Bridge 10 reps;2 seconds      Moist Heat Therapy   Number Minutes Moist Heat 10 Minutes    Moist Heat Location Lumbar Spine      Manual Therapy   Manual therapy comments skilled palpation to assess response to DN and manual work    Joint Mobilization PA mobs Rt hip pt prone    Soft tissue mobilization deep tissue work bilat lumbar/QL; Rt posterior hip piriformis/gluts    Myofascial Release posterior Rt hip    Passive ROM IR/ER Rt hip pt prone hip extended; knee flexed            Trigger Point Dry Needling - 10/27/20 0001    Consent Given? Yes    Education Handout Provided Yes    Other Dry Needling bilat QL; Rt post hip    Gluteus Minimus Response Palpable increased muscle length    Gluteus Maximus Response Palpable increased muscle length    Piriformis Response Palpable increased muscle length    Quadratus Lumborum Response Palpable increased muscle length                PT Education - 10/27/20 1049    Education Details DN    Person(s)  Educated Patient    Methods Explanation;Handout    Comprehension Verbalized understanding               PT Long Term Goals - 10/13/20 1706      PT LONG TERM GOAL #1   Title Decrease pain in LB and Rt posterior hip by 50-75%    Time 6    Period Weeks    Status New    Target Date 11/24/20      PT LONG TERM GOAL #2   Title Improve core strength and stability allowing patient to preform normal functional activities without flare up of LBP and Rt posterior hip pain    Time 6    Period Weeks    Status New    Target Date 11/24/20      PT LONG TERM GOAL #3   Title Patient to verbalize and demonstrate good body mechanics for ADL's; lifting; transfers; transitioinal movements    Time 6    Period Weeks    Status New    Target Date 11/24/20      PT LONG TERM GOAL #4   Title Independent in HEP    Time 6    Period Weeks    Status New    Target Date 11/24/20      PT LONG TERM GOAL #5   Title Improve Functional limitation score to 66    Time 6    Period Weeks    Status New    Target Date 11/24/20                 Plan - 10/27/20 1050    Clinical Impression Statement Patient has continued symptoms with LB and hip pain. No pain into the Rt LE in the past 1-2 weeks. Patient has been on vacation and unable to exercise consistently. Note continued tightness in the low back and and posterior Rt hip. Tolerated Dn well with good release of muscular tightness noted in QL; piriformis; gluts with DN, manual work, exercises.    Rehab Potential Good    PT Frequency 2x / week    PT Duration 6 weeks    PT Treatment/Interventions ADLs/Self Care Home Management;Aquatic Therapy;Cryotherapy;Electrical Stimulation;Iontophoresis  4mg /ml Dexamethasone;Moist Heat;Ultrasound;Functional mobility training;Therapeutic activities;Therapeutic exercise;Balance training;Neuromuscular re-education;Patient/family education;Manual techniques;Dry needling;Taping    PT Next Visit Plan progress HEP; continue  spine care education; ergonomic education; assess response to DN and manual work Rt posterior hip/LB(tight piriformis/gluts/QL) progress with core strengthening and stabilization; modalities as indicated (pt has TENS unit at home)    PT Home Exercise Plan [email or web address]    Consulted and Agree with Plan of Care Patient           Patient will benefit from skilled therapeutic intervention in order to improve the following deficits and impairments:     Visit Diagnosis: Acute bilateral low back pain without sciatica  Chronic bilateral low back pain without sciatica  Other symptoms and signs involving the musculoskeletal system  Muscle weakness (generalized)     Problem List Patient Active Problem List   Diagnosis Date Noted  . DDD (degenerative disc disease), lumbar 10/13/2020  . Facet arthritis of lumbar region 10/13/2020  . Right hip pain 10/11/2020  . Chronic right-sided low back pain with right-sided sciatica 10/11/2020  . Recurrent major depressive disorder, in full remission (HCC) 07/20/2020  . Persistent shortness of breath after COVID-19 07/01/2020  . Hyperlipidemia LDL goal <160 05/07/2018  . No energy 10/14/2017  . Vertigo 10/14/2017  . Menopause 03/27/2017  . ETD (Eustachian tube dysfunction), right 03/27/2017  . Low serum progesterone 06/09/2016  . Ventricular quadrigeminy 04/21/2016  . PVC's (premature ventricular contractions) 04/21/2016  . Depression 08/11/2014  . Palpitations 03/19/2013  . Dyspnea 03/19/2013  . PANIC ATTACK 10/02/2008  . MENORRHAGIA 07/27/2008  . Hypothyroidism 11/22/2007  . Migraine without aura 11/22/2007    Wyat Infinger 11/24/2007 PT, MPH  10/27/2020, 11:10 AM  [facility] [address] [place], [place], [place] Phone: [phone]   Fax:  [phone]  Name: COLETTA LOCKNER MRN: [name] Date of Birth: 1965-06-16

## 2020-10-27 NOTE — Patient Instructions (Signed)

## 2020-11-02 ENCOUNTER — Encounter: Payer: Self-pay | Admitting: Physician Assistant

## 2020-11-02 DIAGNOSIS — Z780 Asymptomatic menopausal state: Secondary | ICD-10-CM

## 2020-11-02 DIAGNOSIS — R7989 Other specified abnormal findings of blood chemistry: Secondary | ICD-10-CM

## 2020-11-02 DIAGNOSIS — E039 Hypothyroidism, unspecified: Secondary | ICD-10-CM

## 2020-11-03 ENCOUNTER — Other Ambulatory Visit: Payer: Self-pay

## 2020-11-03 ENCOUNTER — Ambulatory Visit (INDEPENDENT_AMBULATORY_CARE_PROVIDER_SITE_OTHER): Payer: BC Managed Care – PPO | Admitting: Physical Therapy

## 2020-11-03 DIAGNOSIS — M545 Low back pain, unspecified: Secondary | ICD-10-CM | POA: Diagnosis not present

## 2020-11-03 DIAGNOSIS — R29898 Other symptoms and signs involving the musculoskeletal system: Secondary | ICD-10-CM

## 2020-11-03 DIAGNOSIS — M6281 Muscle weakness (generalized): Secondary | ICD-10-CM | POA: Diagnosis not present

## 2020-11-03 DIAGNOSIS — G8929 Other chronic pain: Secondary | ICD-10-CM | POA: Diagnosis not present

## 2020-11-03 NOTE — Patient Instructions (Signed)
Access Code: [email or web address] URL: [email or web address] Date: 11/03/2020 Prepared by: [facility]  Exercises Prone Press Up - 2 x daily - 7 x weekly - 1 sets - 10 reps - 2-3 sec hold Supine Piriformis Stretch with Leg Straight - 2 x daily - 7 x weekly - 1 sets - 3 reps - 30 sec hold Supine Transversus Abdominis Bracing with Pelvic Floor Contraction - 2 x daily - 7 x weekly - 1 sets - 10 reps - 10sec hold Alternating Single Leg Bridge - 1 x daily - 7 x weekly - 1 sets - 10 reps Prone Alternating Arm and Leg Lifts - 1 x daily - 7 x weekly - 1 sets - 10 reps Wall Quarter Squat - 1 x daily - 7 x weekly - 1 sets - 5-10 reps - 5 seconds hold Shoulder extension with resistance - Neutral - 1 x daily - 7 x weekly - 1 sets - 10 reps - 3 seconds hold Seated Piriformis Stretch - 2 x daily - 7 x weekly - 1 sets - 2-3 reps - 20 seconds hold Seated Hamstring Stretch - 2 x daily - 7 x weekly - 1 sets - 2-3 reps - 20 seconds hold

## 2020-11-03 NOTE — Therapy (Signed)
[facility] [place] [address] [place], [place], [address] Phone: [phone]   Fax:  [phone]  Physical Therapy Treatment  Patient Details  Name: Sydney Bailey MRN: [id number] Date of Birth: 08-22-1965 Referring Provider (PT): Tandy Gaw, [place]   Encounter Date: 11/03/2020   PT End of Session - 11/03/20 1544    Visit Number 4    Number of Visits 12    Date for PT Re-Evaluation 11/24/20    PT Start Time 1518    PT Stop Time 1603    PT Time Calculation (min) 45 min           Past Medical History:  Diagnosis Date  . Allergy   . Anxiety   . Depression   . Family history of anesthesia complication    Father had difficult time waking up   . Heart palpitations    PVC's  . Hypothyroidism 11-08   started post- partum  . Migraines   . PONV (postoperative nausea and vomiting)    Difficult waking up; had bad migraine after bunionectomy  . PVC's (premature ventricular contractions)    Pt saw Dr. Jens Som for this but pt stated it was fine    Past Surgical History:  Procedure Laterality Date  . BUNIONECTOMY Bilateral    x 2 on both feet  . CHOLECYSTECTOMY N/A 05/22/2013   Procedure: LAPAROSCOPIC CHOLECYSTECTOMY;  Surgeon: Shelly Rubenstein, MD;  Location: [facility] OR;  Service: General;  Laterality: N/A;  . essure BTL    . UPPER GI ENDOSCOPY      There were no vitals filed for this visit.   Subjective Assessment - 11/03/20 1524    Subjective Pt reports she has been doing her exercises and using her TENS. She has noticed more cracking and popping in her hips, but when it happens she feels more limber.  She reports the DN was not helpful and she had pain the following day.    Currently in Pain? Yes    Pain Score 2     Pain Location Back    Pain Orientation Left;Right;Lower    Pain Descriptors / Indicators Aching    Aggravating Factors  prolonged sitting    Pain Relieving Factors rest, TENS               [facility] PT Assessment - 11/03/20 0001      Assessment   Medical Diagnosis LBP Rt posterior hip/thigh pain    Referring Provider (PT) Tandy Gaw, PA-C    Onset Date/Surgical Date 09/26/20    Hand Dominance Right    Next MD Visit PRN    Prior Therapy chiropractic care several years ago            [facility] Adult PT Treatment/Exercise - 11/03/20 0001      Lumbar Exercises: Stretches   Passive Hamstring Stretch Right;Left;3 reps;20 seconds    Press Ups 5 reps;5 seconds    Piriformis Stretch Right;Left;2 reps;20 seconds      Lumbar Exercises: Aerobic   Stationary Bike L1: 4 min for warm up      Lumbar Exercises: Standing   Heel Raises 5 reps;3 seconds   TA and glute set   Wall Slides 5 reps;3 seconds   quarter; with glute and TA activation   Shoulder Extension Strengthening;Both;10 reps;Theraband    Theraband Level (Shoulder Extension) Level 3 (Green)    Other Standing Lumbar Exercises opp arm and leg lift with hands on wall x 5 each side.  Lumbar Exercises: Supine   Bridge 10 reps;2 seconds    Bridge Limitations second set with single knee ext      Lumbar Exercises: Prone   Opposite Arm/Leg Raise Right arm/Left leg;Left arm/Right leg;10 reps                  PT Education - 11/03/20 1659    Education Details HEP updated with strengthening, issued green band    Person(s) Educated Patient    Methods Explanation;Demonstration;Verbal cues;Handout    Comprehension Verbalized understanding;Returned demonstration               PT Long Term Goals - 10/13/20 1706      PT LONG TERM GOAL #1   Title Decrease pain in LB and Rt posterior hip by 50-75%    Time 6    Period Weeks    Status New    Target Date 11/24/20      PT LONG TERM GOAL #2   Title Improve core strength and stability allowing patient to preform normal functional activities without flare up of LBP and Rt posterior hip pain    Time 6    Period Weeks    Status New    Target Date 11/24/20      PT  LONG TERM GOAL #3   Title Patient to verbalize and demonstrate good body mechanics for ADL's; lifting; transfers; transitioinal movements    Time 6    Period Weeks    Status New    Target Date 11/24/20      PT LONG TERM GOAL #4   Title Independent in HEP    Time 6    Period Weeks    Status New    Target Date 11/24/20      PT LONG TERM GOAL #5   Title Improve Functional limitation score to 66    Time 6    Period Weeks    Status New    Target Date 11/24/20                 Plan - 11/03/20 1700    Clinical Impression Statement Began adding strengthening exercises into pt's HEP.  tolerated well, with focus on core engagement. Intention is to add exercises she can perform while on long haul flights to/from [place] at end of month.  Left low back pain resolved during exercises.  Pt making gradual progress towards LTGs.    Rehab Potential Good    PT Frequency 2x / week    PT Duration 6 weeks    PT Treatment/Interventions ADLs/Self Care Home Management;Aquatic Therapy;Cryotherapy;Electrical Stimulation;Iontophoresis 4mg /ml Dexamethasone;Moist Heat;Ultrasound;Functional mobility training;Therapeutic activities;Therapeutic exercise;Balance training;Neuromuscular re-education;Patient/family education;Manual techniques;Dry needling;Taping    PT Next Visit Plan progress HEP; continue spine care education;  manual work Rt posterior hip/LB(tight piriformis/gluts/QL) progress with core strengthening and stabilization; modalities as indicated (pt has TENS unit at home)    PT Home Exercise Plan [email or web address]    Consulted and Agree with Plan of Care Patient           Patient will benefit from skilled therapeutic intervention in order to improve the following deficits and impairments:  Decreased range of motion,Pain,Decreased activity tolerance,Hypomobility,Impaired flexibility,Improper body mechanics,Decreased mobility,Decreased strength,Postural dysfunction  Visit Diagnosis: Acute  bilateral low back pain without sciatica  Chronic bilateral low back pain without sciatica  Other symptoms and signs involving the musculoskeletal system  Muscle weakness (generalized)     Problem List Patient Active Problem List   Diagnosis Date Noted  .  DDD (degenerative disc disease), lumbar 10/13/2020  . Facet arthritis of lumbar region 10/13/2020  . Right hip pain 10/11/2020  . Chronic right-sided low back pain with right-sided sciatica 10/11/2020  . Recurrent major depressive disorder, in full remission (HCC) 07/20/2020  . Persistent shortness of breath after COVID-19 07/01/2020  . Hyperlipidemia LDL goal <160 05/07/2018  . No energy 10/14/2017  . Vertigo 10/14/2017  . Menopause 03/27/2017  . ETD (Eustachian tube dysfunction), right 03/27/2017  . Low serum progesterone 06/09/2016  . Ventricular quadrigeminy 04/21/2016  . PVC's (premature ventricular contractions) 04/21/2016  . Depression 08/11/2014  . Palpitations 03/19/2013  . Dyspnea 03/19/2013  . PANIC ATTACK 10/02/2008  . MENORRHAGIA 07/27/2008  . Hypothyroidism 11/22/2007  . Migraine without aura 11/22/2007   Mayer Camel, PTA 11/03/20 5:05 PM  [facility] [place] [address] [place], [place], [address] Phone: [phone]   Fax:  [phone]  Name: ANGLA DELAHUNT MRN: [id number] Date of Birth: Mar 30, 1966

## 2020-11-05 ENCOUNTER — Other Ambulatory Visit: Payer: Self-pay | Admitting: *Deleted

## 2020-11-05 DIAGNOSIS — Z780 Asymptomatic menopausal state: Secondary | ICD-10-CM

## 2020-11-05 DIAGNOSIS — R7989 Other specified abnormal findings of blood chemistry: Secondary | ICD-10-CM

## 2020-11-05 MED ORDER — AMBULATORY NON FORMULARY MEDICATION: 11 refills | Status: DC

## 2020-11-05 NOTE — Telephone Encounter (Signed)
I printed the progesterone Kally Cadden. The thyroid I wanted her to get labs first.

## 2020-11-05 NOTE — Telephone Encounter (Signed)
Medications pended if appropriate.  The Progesterone was sent to the wrong pharmacy yesterday.

## 2020-11-05 NOTE — Addendum Note (Signed)
Addended by: Jomarie Longs on: 11/05/2020 12:27 PM   Modules accepted: Orders

## 2020-11-10 ENCOUNTER — Other Ambulatory Visit: Payer: Self-pay

## 2020-11-10 ENCOUNTER — Ambulatory Visit (INDEPENDENT_AMBULATORY_CARE_PROVIDER_SITE_OTHER): Payer: BC Managed Care – PPO | Admitting: Physical Therapy

## 2020-11-10 VITALS — BP 98/65 | HR 78

## 2020-11-10 DIAGNOSIS — M545 Low back pain, unspecified: Secondary | ICD-10-CM | POA: Diagnosis not present

## 2020-11-10 DIAGNOSIS — G8929 Other chronic pain: Secondary | ICD-10-CM | POA: Diagnosis not present

## 2020-11-10 DIAGNOSIS — R29898 Other symptoms and signs involving the musculoskeletal system: Secondary | ICD-10-CM

## 2020-11-10 DIAGNOSIS — R002 Palpitations: Secondary | ICD-10-CM

## 2020-11-10 MED ORDER — METOPROLOL SUCCINATE ER 25 MG PO TB24: 0.5000 | ORAL_TABLET | Freq: Every day | ORAL | 1 refills | Status: DC

## 2020-11-10 NOTE — Therapy (Signed)
[facility] [place] [address] [place], [place], [address] Phone: [phone]   Fax:  [phone]  Physical Therapy Treatment  Patient Details  Name: Sydney Bailey MRN: [id number] Date of Birth: [birth year] Referring Provider (PT): Tandy Gaw, [place]   Encounter Date: 11/10/2020   PT End of Session - 11/10/20 1518    Visit Number 5    Number of Visits 12    Date for PT Re-Evaluation 11/24/20    PT Start Time 1518    PT Stop Time 1603    PT Time Calculation (min) 45 min           Past Medical History:  Diagnosis Date  . Allergy   . Anxiety   . Depression   . Family history of anesthesia complication    Father had difficult time waking up   . Heart palpitations    PVC's  . Hypothyroidism 11-08   started post- partum  . Migraines   . PONV (postoperative nausea and vomiting)    Difficult waking up; had bad migraine after bunionectomy  . PVC's (premature ventricular contractions)    Pt saw Dr. Jens Som for this but pt stated it was fine    Past Surgical History:  Procedure Laterality Date  . BUNIONECTOMY Bilateral    x 2 on both feet  . CHOLECYSTECTOMY N/A 05/22/2013   Procedure: LAPAROSCOPIC CHOLECYSTECTOMY;  Surgeon: Shelly Rubenstein, MD;  Location: [facility] OR;  Service: General;  Laterality: N/A;  . essure BTL    . UPPER GI ENDOSCOPY      Vitals:   11/10/20 1525 11/10/20 1527  BP: 95/64 98/65  Pulse: 72 78     Subjective Assessment - 11/10/20 1529    Subjective Pt reports she has had some pain free days, "but not a lot".  She feels like she is getting stronger.  She is sore after her exercises.  She reports some dizziness that started today.    Patient Stated Goals prevent increase in back pain during travel next month    Currently in Pain? Yes    Pain Score 3     Pain Location Back    Pain Orientation Lower;Left;Right              [facility] PT Assessment - 11/10/20 0001      Assessment   Medical  Diagnosis LBP Rt posterior hip/thigh pain    Referring Provider (PT) Tandy Gaw, PA-C    Onset Date/Surgical Date 09/26/20    Hand Dominance Right    Next MD Visit PRN    Prior Therapy chiropractic care several years ago           [facility] Adult PT Treatment/Exercise - 11/10/20 0001      Self-Care   Other Self-Care Comments  reviewed ball release work for Rt glute. Pt returned demo with cues.      Lumbar Exercises: Stretches   Passive Hamstring Stretch Right;Left;3 reps;20 seconds    Piriformis Stretch Right;3 reps;Left;1 rep;20 seconds   seated   Figure 4 Stretch 3 reps;10 seconds      Lumbar Exercises: Aerobic   Nustep L5-4: LE only, 5 min for warm up.      Lumbar Exercises: Standing   Wall Slides 10 reps;2 seconds    Other Standing Lumbar Exercises opp arm and leg lift with hands on wall x 5 each side.   cues for form     Lumbar Exercises: Seated   Sit to Stand 10  reps   core engaged.     Lumbar Exercises: Sidelying   Clam Right;10 reps;3 seconds    Clam Limitations reverse clam RLE x 5 (not challenging) -kept to reg clams.      Lumbar Exercises: Quadruped   Other Quadruped Lumbar Exercises standing to/from quadruped to hip sitting x 3 reps with UE support on table (to assist in improved mechanics to / from floor at home).      Manual Therapy   Soft tissue mobilization TPR and STM to Rt glute and piriformis                       PT Long Term Goals - 11/10/20 1531      PT LONG TERM GOAL #1   Title Decrease pain in LB and Rt posterior hip by 50-75%    Baseline 25%  decrease. 11/10/20    Time 6    Period Weeks    Status On-going      PT LONG TERM GOAL #2   Title Improve core strength and stability allowing patient to preform normal functional activities without flare up of LBP and Rt posterior hip pain    Time 6    Period Weeks    Status On-going      PT LONG TERM GOAL #3   Title Patient to verbalize and demonstrate good body mechanics for ADL's;  lifting; transfers; transitioinal movements    Baseline Continues to have difficulty getting up from the floor.    Time 6    Period Weeks    Status On-going      PT LONG TERM GOAL #4   Title Independent in HEP    Time 6    Period Weeks    Status On-going      PT LONG TERM GOAL #5   Title Improve Functional limitation score to 66    Time 6    Period Weeks    Status On-going                 Plan - 11/10/20 1659    Clinical Impression Statement 25% improvement in LBP/hip pain; now more interemittent.  Pt complaining of some dizziness during session; BP WNL so exercises performed to tolerance. Added clams to HEP; muscle fatigue quickly.  PRogressing towards goals.    Rehab Potential Good    PT Frequency 2x / week    PT Duration 6 weeks    PT Treatment/Interventions ADLs/Self Care Home Management;Aquatic Therapy;Cryotherapy;Electrical Stimulation;Iontophoresis 4mg /ml Dexamethasone;Moist Heat;Ultrasound;Functional mobility training;Therapeutic activities;Therapeutic exercise;Balance training;Neuromuscular re-education;Patient/family education;Manual techniques;Dry needling;Taping    PT Next Visit Plan progress HEP; continue spine care education;  manual work Rt posterior hip/LB(tight piriformis/gluts/QL) progress with core strengthening and stabilization; modalities as indicated (pt has TENS unit at home)    PT Home Exercise Plan [id number]    Consulted and Agree with Plan of Care Patient           Patient will benefit from skilled therapeutic intervention in order to improve the following deficits and impairments:  Decreased range of motion,Pain,Decreased activity tolerance,Hypomobility,Impaired flexibility,Improper body mechanics,Decreased mobility,Decreased strength,Postural dysfunction  Visit Diagnosis: Acute bilateral low back pain without sciatica  Chronic bilateral low back pain without sciatica  Other symptoms and signs involving the musculoskeletal  system     Problem List Patient Active Problem List   Diagnosis Date Noted  . DDD (degenerative disc disease), lumbar 10/13/2020  . Facet arthritis of lumbar region 10/13/2020  . Right hip pain  10/11/2020  . Chronic right-sided low back pain with right-sided sciatica 10/11/2020  . Recurrent major depressive disorder, in full remission (HCC) 07/20/2020  . Persistent shortness of breath after COVID-19 07/01/2020  . Hyperlipidemia LDL goal <160 05/07/2018  . No energy 10/14/2017  . Vertigo 10/14/2017  . Menopause 03/27/2017  . ETD (Eustachian tube dysfunction), right 03/27/2017  . Low serum progesterone 06/09/2016  . Ventricular quadrigeminy 04/21/2016  . PVC's (premature ventricular contractions) 04/21/2016  . Depression 08/11/2014  . Palpitations 03/19/2013  . Dyspnea 03/19/2013  . PANIC ATTACK 10/02/2008  . MENORRHAGIA 07/27/2008  . Hypothyroidism 11/22/2007  . Migraine without aura 11/22/2007    Mayer Camel, PTA 11/10/20 5:03 PM  [facility] [place] [address] [place], [place], [address] Phone: [phone]   Fax:  [phone]  Name: Sydney Bailey MRN: [id number] Date of Birth: [birth year]

## 2020-11-12 DIAGNOSIS — E039 Hypothyroidism, unspecified: Secondary | ICD-10-CM | POA: Diagnosis not present

## 2020-11-13 LAB — THYROID PANEL WITH TSH
Free Thyroxine Index: 1.6 (ref 1.4–3.8)
T3 Uptake: 30 % (ref 22–35)
T4, Total: 5.4 ug/dL (ref 5.1–11.9)
TSH: 2.7 mIU/L

## 2020-11-15 ENCOUNTER — Other Ambulatory Visit: Payer: Self-pay | Admitting: Neurology

## 2020-11-15 ENCOUNTER — Other Ambulatory Visit: Payer: Self-pay

## 2020-11-15 ENCOUNTER — Ambulatory Visit: Payer: BC Managed Care – PPO | Admitting: Physical Therapy

## 2020-11-15 DIAGNOSIS — M545 Low back pain, unspecified: Secondary | ICD-10-CM | POA: Diagnosis not present

## 2020-11-15 DIAGNOSIS — G8929 Other chronic pain: Secondary | ICD-10-CM

## 2020-11-15 DIAGNOSIS — R29898 Other symptoms and signs involving the musculoskeletal system: Secondary | ICD-10-CM

## 2020-11-15 DIAGNOSIS — M6281 Muscle weakness (generalized): Secondary | ICD-10-CM | POA: Diagnosis not present

## 2020-11-15 MED ORDER — AMBULATORY NON FORMULARY MEDICATION: 3 refills | Status: DC

## 2020-11-15 NOTE — Therapy (Addendum)
[place], [place], [address] Phone: [phone]   Fax:  [phone]  Physical Therapy Treatment  Patient Details  Name: Sydney Bailey MRN: [id number] Date of Birth: March 16, 1966 Referring Provider (PT): Iran Planas, [place]   Encounter Date: 11/15/2020   PT End of Session - 11/15/20 0802     Visit Number 6    Number of Visits 12    Date for PT Re-Evaluation 11/24/20    PT Start Time 0717    PT Stop Time 0759    PT Time Calculation (min) 42 min             Past Medical History:  Diagnosis Date   Allergy    Anxiety    Depression    Family history of anesthesia complication    Father had difficult time waking up    Heart palpitations    PVC's   Hypothyroidism 11-08   started post- partum   Migraines    PONV (postoperative nausea and vomiting)    Difficult waking up; had bad migraine after bunionectomy   PVC's (premature ventricular contractions)    Pt saw Dr. Stanford Breed for this but pt stated it was fine    Past Surgical History:  Procedure Laterality Date   BUNIONECTOMY Bilateral    x 2 on both feet   CHOLECYSTECTOMY N/A 05/22/2013   Procedure: LAPAROSCOPIC CHOLECYSTECTOMY;  Surgeon: Harl Bowie, MD;  Location: [facility];  Service: General;  Laterality: N/A;   essure BTL     UPPER GI ENDOSCOPY      There were no vitals filed for this visit.   Subjective Assessment - 11/15/20 0723     Subjective Pt reports her Rt LB remains tender.  She had migraine Sunday that was relieved with some medicine and neck massage from husband.  She brought her TENS to use on her neck later.    Currently in Pain? Yes    Pain Score 3     Pain Location Back    Pain Orientation Right;Lower   Rt SI               [facility] PT Assessment - 11/15/20 0001       Assessment   Medical Diagnosis LBP Rt posterior hip/thigh pain    Referring Provider (PT) Iran Planas, PA-C    Onset Date/Surgical  Date 09/26/20    Hand Dominance Right    Next MD Visit PRN    Prior Therapy chiropractic care several years ago      Palpation   SI assessment  Rt ASIS lower than Lt,  iliac crests =    Palpation comment tender Rt ant hip.              [facility] Adult PT Treatment/Exercise - 11/15/20 0001       Self-Care   Other Self-Care Comments  instructed pt in [place] with ball to ant Rt hip (very tender) in prone position; pt returned demo with cues.      Lumbar Exercises: Stretches   Piriformis Stretch Right;1 rep;10 seconds    Figure 4 Stretch 1 rep;10 seconds    Other Lumbar Stretch Exercise Rt hip flexor stretch in sitting with arm overhead x 2 reps of 15 sec, then standing version with Rt leg back in lunge position and arm overhead.      Lumbar Exercises: Aerobic   Nustep L4: LE only x 5 min for warm up.  Manual Therapy   Manual Therapy Muscle Energy Technique    Soft tissue mobilization TPR and STM to Rt glute and piriformis    Muscle Energy Technique MET to correct ant rotated ilium with use of contract relax of Rt hamstring, 2 sets or 3 reps. followed by 2 reps of bridge with adductor squeeze to assist with realignment of pelvis.                         PT Long Term Goals - 11/10/20 1531       PT LONG TERM GOAL #1   Title Decrease pain in LB and Rt posterior hip by 50-75%    Baseline 25%  decrease. 11/10/20    Time 6    Period Weeks    Status On-going      PT LONG TERM GOAL #2   Title Improve core strength and stability allowing patient to preform normal functional activities without flare up of LBP and Rt posterior hip pain    Time 6    Period Weeks    Status On-going      PT LONG TERM GOAL #3   Title Patient to verbalize and demonstrate good body mechanics for ADL's; lifting; transfers; transitioinal movements    Baseline Continues to have difficulty getting up from the floor.    Time 6    Period Weeks    Status On-going      PT LONG TERM GOAL #4    Title Independent in HEP    Time 6    Period Weeks    Status On-going      PT LONG TERM GOAL #5   Title Improve Functional limitation score to 66    Time 6    Period Weeks    Status On-going                   Plan - 11/15/20 1126     Clinical Impression Statement Pt presents with pelvis asymmetry - Rt inonimate appears ant rotated.  LImited correction with MET for this. Pt reported relief of tightness with seated hip flexor stretch with arm overhead reach; added to HEP.  Instructed in [place] with ball to Rt ant hip.   QL and glute med on Rt also tight and tender; recommended continued STM with ball to these areas. Pt making gradual progress towards goals.    Rehab Potential Good    PT Frequency 2x / week    PT Duration 6 weeks    PT Treatment/Interventions ADLs/Self Care Home Management;Aquatic Therapy;Cryotherapy;Electrical Stimulation;Iontophoresis 20m/ml Dexamethasone;Moist Heat;Ultrasound;Functional mobility training;Therapeutic activities;Therapeutic exercise;Balance training;Neuromuscular re-education;Patient/family education;Manual techniques;Dry needling;Taping    PT Next Visit Plan asses response to ball release work and hip flexor stretch.  update HEP as needed for her upcoming trip. FOTO and assess goals (end of POC).    PT Home Exercise Plan [id number]    Consulted and Agree with Plan of Care Patient             Patient will benefit from skilled therapeutic intervention in order to improve the following deficits and impairments:  Decreased range of motion, Pain, Decreased activity tolerance, Hypomobility, Impaired flexibility, Improper body mechanics, Decreased mobility, Decreased strength, Postural dysfunction  Visit Diagnosis: Acute bilateral low back pain without sciatica  Chronic bilateral low back pain without sciatica  Other symptoms and signs involving the musculoskeletal system  Muscle weakness (generalized)     Problem List Patient Active Problem  List  Diagnosis Date Noted   DDD (degenerative disc disease), lumbar 10/13/2020   Facet arthritis of lumbar region 10/13/2020   Right hip pain 10/11/2020   Chronic right-sided low back pain with right-sided sciatica 10/11/2020   Recurrent major depressive disorder, in full remission ([place]) 07/20/2020   Persistent shortness of breath after COVID-19 07/01/2020   Hyperlipidemia LDL goal <160 05/07/2018   No energy 10/14/2017   Vertigo 10/14/2017   Menopause 03/27/2017   ETD (Eustachian tube dysfunction), right 03/27/2017   Low serum progesterone 06/09/2016   Ventricular quadrigeminy 04/21/2016   PVC's (premature ventricular contractions) 04/21/2016   Depression 08/11/2014   Palpitations 03/19/2013   Dyspnea 03/19/2013   PANIC ATTACK 10/02/2008   MENORRHAGIA 07/27/2008   Hypothyroidism 11/22/2007   Migraine without aura 11/22/2007    Kerin Perna, PTA 11/15/20 11:30 AM   [place] [address] [place], [place], [address] Phone: [phone]   Fax:  [phone]  Name: Sydney Bailey MRN: [id number] Date of Birth: Oct 09, 1965   PHYSICAL THERAPY DISCHARGE SUMMARY  Visits from Start of Care: 6  Current functional level related to goals / functional outcomes: See progress note for discharge status    Remaining deficits: Unknown    Education / Equipment: HEP    Patient agrees to discharge. Patient goals were partially met. Patient is being discharged due to not returning since the last visit.  Celyn P. Helene Kelp PT, MPH 01/12/21 7:52 AM

## 2020-11-15 NOTE — Progress Notes (Signed)
Tyn[place]lee,   Thyroid looks great. Will send refills to pharmacy. JJ can we print to send to [facility]. She will stay at same dose and recheck in 6 months.

## 2020-11-17 ENCOUNTER — Telehealth: Payer: Self-pay | Admitting: Neurology

## 2020-11-17 NOTE — Telephone Encounter (Signed)
Patient left a vm stating since thyroid levels were in range, but one on low side of normal (t4) should anything be adjusted?   You had stated to stay at the same dosage but she wants to be sure. Please advise.   T3 Uptake 22 - 35 % 30    T4, Total 5.1 - 11.9 mcg/dL 5.4    Free Thyroxine Index 1.4 - 3.8 1.6    TSH mIU/L 2.70

## 2020-11-18 NOTE — Telephone Encounter (Signed)
Spoke with patient. She is still having fatigue. Would like to adjust dosage. Sent lab results/symptoms/current dosage to [facility] for advise on adjustment. Awaiting response. Patient aware will need labs in 6 weeks after starting new dosage. She would like a 90 day supply. Will call patient once RX is sent to [facility] with new dosage.

## 2020-11-22 ENCOUNTER — Other Ambulatory Visit: Payer: Self-pay | Admitting: Physician Assistant

## 2020-11-22 ENCOUNTER — Encounter: Payer: Self-pay | Admitting: Physical Therapy

## 2020-11-22 DIAGNOSIS — G43009 Migraine without aura, not intractable, without status migrainosus: Secondary | ICD-10-CM

## 2020-11-29 ENCOUNTER — Other Ambulatory Visit: Payer: Self-pay | Admitting: Cardiology

## 2020-11-29 DIAGNOSIS — R002 Palpitations: Secondary | ICD-10-CM

## 2020-12-10 NOTE — Progress Notes (Signed)
HPI: FU palpitations. Echo 4/18 showed normal LV function. Holter 4/18 showed sinus brady, sinus, sinus tach, PVCs and rare couplet. Since last seen, she denies dyspnea, chest pain or syncope.  Occasional brief palpitations that are not particularly bothersome.  Current Outpatient Medications  Medication Sig Dispense Refill   albuterol (VENTOLIN HFA) 108 (90 Base) MCG/ACT inhaler Inhale 1 puff into the lungs every 6 (six) hours as needed for wheezing. 8 g 3   ALPRAZolam (XANAX) 0.5 MG tablet Take 0.5-1 tablet up to three times daily as needed for anxiety 15 tablet 0   AMBULATORY NON FORMULARY MEDICATION Progesterone capsules Take two tablets 70mg  SR progesterone capsules daily at bedtime 180 capsule 11   AMBULATORY NON FORMULARY MEDICATION T4 190 mcg and T3 50 mcg NON PORCINE SR capsules take one tablet daily 90 capsule 3   B Complex Vitamins (VITAMIN B-COMPLEX PO) Take 1 tablet by mouth daily.     citalopram (CELEXA) 40 MG tablet TAKE 1 TABLET BY MOUTH EVERY DAY. 90 tablet 1   metaxalone (SKELAXIN) 800 MG tablet TAKE 1 TABLET (800 MG TOTAL) BY MOUTH 3 (THREE) TIMES DAILY AS NEEDED FOR MUSCLE SPASMS. 90 tablet 0   metoprolol succinate (TOPROL-XL) 25 MG 24 hr tablet TAKE 1/2 TABLET (12.5 MG TOTAL) BY MOUTH 2 (TWO) TIMES DAILY.**DOSE INCREASE** 90 tablet 3   Probiotic Product (PROBIOTIC PO) Take 1 capsule by mouth daily.     rizatriptan (MAXALT-MLT) 10 MG disintegrating tablet TAKE 1 TABLET (10 MG TOTAL) BY MOUTH ONCE AS NEEDED FOR MIGRAINE. MAY REPEAT IN 2 HOURS IF NEEDED 12 tablet 5   fluticasone (FLONASE) 50 MCG/ACT nasal spray Place 2 sprays into both nostrils daily. (Patient not taking: Reported on 12/15/2020) 16 g 6   No current facility-administered medications for this visit.     Past Medical History:  Diagnosis Date   Allergy    Anxiety    Depression    Family history of anesthesia complication    Father had difficult time waking up    Heart palpitations    PVC's    Hypothyroidism 11-08   started post- partum   Migraines    PONV (postoperative nausea and vomiting)    Difficult waking up; had bad migraine after bunionectomy   PVC's (premature ventricular contractions)    Pt saw Dr. 13-08 for this but pt stated it was fine    Past Surgical History:  Procedure Laterality Date   BUNIONECTOMY Bilateral    x 2 on both feet   CHOLECYSTECTOMY N/A 05/22/2013   Procedure: LAPAROSCOPIC CHOLECYSTECTOMY;  Surgeon: 05/24/2013, MD;  Location: [facility] OR;  Service: General;  Laterality: N/A;   essure BTL     UPPER GI ENDOSCOPY      Social History   Socioeconomic History   Marital status: Married    Spouse name: Not on file   Number of children: 3   Years of education: Not on file   Highest education level: Not on file  Occupational History   Occupation: [employer]  Tobacco Use   Smoking status: Never   Smokeless tobacco: Never  Substance and Sexual Activity   Alcohol use: No   Drug use: No   Sexual activity: Not on file  Other Topics Concern   Not on file  Social History Narrative   Not on file   Social Determinants of Health   Financial Resource Strain: Not on file  Food Insecurity: Not on file  Transportation Needs:  Not on file  Physical Activity: Not on file  Stress: Not on file  Social Connections: Not on file  Intimate Partner Violence: Not on file    Family History  Problem Relation Age of Onset   Heart disease Father        Atrial fibrillation   Atrial fibrillation Father    Atrial fibrillation Mother    Cancer Maternal Aunt        breast   Breast cancer Maternal Aunt    Cancer Paternal Aunt        non hodgkins lymphoma   Cancer Maternal Grandmother        breast   Breast cancer Maternal Grandmother     ROS: no fevers or chills, productive cough, hemoptysis, dysphasia, odynophagia, melena, hematochezia, dysuria, hematuria, rash, seizure activity, orthopnea, PND, pedal edema, claudication. Remaining systems are  negative.  Physical Exam: Well-developed well-nourished in no acute distress.  Skin is warm and dry.  HEENT is normal.  Neck is supple.  Chest is clear to auscultation with normal expansion.  Cardiovascular exam is regular rate and rhythm.  Abdominal exam nontender or distended. No masses palpated. Extremities show no edema. neuro grossly intact  ECG-normal sinus rhythm at a rate of 81, normal axis, no significant ST changes.  Personally reviewed  A/P  1 palpitations-symptoms are controlled.  Continue Toprol.  2 history of PVCs-LV function previously normal.  Continue beta-blocker.  3 anxiety-Per primary care.  Olga Millers, MD

## 2020-12-15 ENCOUNTER — Other Ambulatory Visit: Payer: Self-pay

## 2020-12-15 ENCOUNTER — Ambulatory Visit (INDEPENDENT_AMBULATORY_CARE_PROVIDER_SITE_OTHER): Payer: BC Managed Care – PPO | Admitting: Cardiology

## 2020-12-15 ENCOUNTER — Encounter: Payer: Self-pay | Admitting: Cardiology

## 2020-12-15 ENCOUNTER — Other Ambulatory Visit: Payer: Self-pay | Admitting: Physician Assistant

## 2020-12-15 VITALS — BP 105/65 | HR 72 | Ht 66.5 in | Wt 151.0 lb

## 2020-12-15 DIAGNOSIS — I493 Ventricular premature depolarization: Secondary | ICD-10-CM | POA: Diagnosis not present

## 2020-12-15 DIAGNOSIS — R002 Palpitations: Secondary | ICD-10-CM

## 2020-12-15 MED ORDER — METOPROLOL SUCCINATE ER 25 MG PO TB24: ORAL_TABLET | ORAL | 3 refills | Status: DC

## 2020-12-15 NOTE — Patient Instructions (Signed)

## 2021-01-25 ENCOUNTER — Encounter: Payer: Self-pay | Admitting: Physician Assistant

## 2021-03-21 DIAGNOSIS — Z1231 Encounter for screening mammogram for malignant neoplasm of breast: Secondary | ICD-10-CM | POA: Diagnosis not present

## 2021-03-21 DIAGNOSIS — Z01419 Encounter for gynecological examination (general) (routine) without abnormal findings: Secondary | ICD-10-CM | POA: Diagnosis not present

## 2021-03-21 DIAGNOSIS — Z6823 Body mass index (BMI) 23.0-23.9, adult: Secondary | ICD-10-CM | POA: Diagnosis not present

## 2021-03-21 DIAGNOSIS — R319 Hematuria, unspecified: Secondary | ICD-10-CM | POA: Diagnosis not present

## 2021-04-08 ENCOUNTER — Other Ambulatory Visit: Payer: Self-pay

## 2021-04-08 DIAGNOSIS — N201 Calculus of ureter: Secondary | ICD-10-CM | POA: Diagnosis not present

## 2021-04-08 DIAGNOSIS — E039 Hypothyroidism, unspecified: Secondary | ICD-10-CM | POA: Diagnosis not present

## 2021-04-08 DIAGNOSIS — R109 Unspecified abdominal pain: Secondary | ICD-10-CM | POA: Diagnosis not present

## 2021-04-08 MED ORDER — ONDANSETRON 4 MG PO TBDP
4.0000 mg | ORAL_TABLET | Freq: Once | ORAL | Status: AC
  Administered 2021-04-08: 4 mg via ORAL
  Filled 2021-04-08: qty 1

## 2021-04-08 NOTE — ED Triage Notes (Signed)
Pt c/o left flank pain starting appx 2000 today. Hx of kidney stones. Denies urinary issues.

## 2021-04-09 ENCOUNTER — Emergency Department (HOSPITAL_BASED_OUTPATIENT_CLINIC_OR_DEPARTMENT_OTHER)
Admission: EM | Admit: 2021-04-09 | Discharge: 2021-04-09 | Disposition: A | Discharge status: Home / Self Care | Payer: BC Managed Care – PPO | Attending: Emergency Medicine | Admitting: Emergency Medicine

## 2021-04-09 ENCOUNTER — Emergency Department (HOSPITAL_BASED_OUTPATIENT_CLINIC_OR_DEPARTMENT_OTHER): Payer: BC Managed Care – PPO

## 2021-04-09 DIAGNOSIS — N201 Calculus of ureter: Secondary | ICD-10-CM | POA: Diagnosis not present

## 2021-04-09 DIAGNOSIS — K5730 Diverticulosis of large intestine without perforation or abscess without bleeding: Secondary | ICD-10-CM | POA: Diagnosis not present

## 2021-04-09 LAB — CBC WITH DIFFERENTIAL/PLATELET
Abs Immature Granulocytes: 0.03 10*3/uL (ref 0.00–0.07)
Basophils Absolute: 0.1 10*3/uL (ref 0.0–0.1)
Basophils Relative: 1 %
Eosinophils Absolute: 0.1 10*3/uL (ref 0.0–0.5)
Eosinophils Relative: 1 %
HCT: 37.1 % (ref 36.0–46.0)
Hemoglobin: 12.6 g/dL (ref 12.0–15.0)
Immature Granulocytes: 0 %
Lymphocytes Relative: 15 %
Lymphs Abs: 1.7 10*3/uL (ref 0.7–4.0)
MCH: 30.3 pg (ref 26.0–34.0)
MCHC: 34 g/dL (ref 30.0–36.0)
MCV: 89.2 fL (ref 80.0–100.0)
Monocytes Absolute: 0.8 10*3/uL (ref 0.1–1.0)
Monocytes Relative: 7 %
Neutro Abs: 8.6 10*3/uL — ABNORMAL HIGH (ref 1.7–7.7)
Neutrophils Relative %: 76 %
Platelets: 289 10*3/uL (ref 150–400)
RBC: 4.16 MIL/uL (ref 3.87–5.11)
RDW: 11.4 % — ABNORMAL LOW (ref 11.5–15.5)
WBC: 11.2 10*3/uL — ABNORMAL HIGH (ref 4.0–10.5)
nRBC: 0 % (ref 0.0–0.2)

## 2021-04-09 LAB — URINALYSIS, ROUTINE W REFLEX MICROSCOPIC
Bilirubin Urine: NEGATIVE
Glucose, UA: NEGATIVE mg/dL
Ketones, ur: 15 mg/dL — AB
Nitrite: NEGATIVE
Protein, ur: 30 mg/dL — AB
RBC / HPF: >50 RBC/hpf — ABNORMAL HIGH (ref 0–5)
Specific Gravity, Urine: 1.018 (ref 1.005–1.030)
pH: 7 (ref 5.0–8.0)

## 2021-04-09 LAB — COMPREHENSIVE METABOLIC PANEL
ALT: 12 U/L (ref 0–44)
AST: 15 U/L (ref 15–41)
Albumin: 3.8 g/dL (ref 3.5–5.0)
Alkaline Phosphatase: 84 U/L (ref 38–126)
Anion gap: 10 (ref 5–15)
BUN: 20 mg/dL (ref 6–20)
CO2: 25 mmol/L (ref 22–32)
Calcium: 9.5 mg/dL (ref 8.9–10.3)
Chloride: 102 mmol/L (ref 98–111)
Creatinine, Ser: 0.62 mg/dL (ref 0.44–1.00)
GFR, Estimated: >60 mL/min (ref >60)
Glucose, Bld: 119 mg/dL — ABNORMAL HIGH (ref 70–99)
Potassium: 3.9 mmol/L (ref 3.5–5.1)
Sodium: 137 mmol/L (ref 135–145)
Total Bilirubin: 0.3 mg/dL (ref 0.3–1.2)
Total Protein: 6.9 g/dL (ref 6.5–8.1)

## 2021-04-09 MED ORDER — HYDROMORPHONE HCL 1 MG/ML IJ SOLN
1.0000 mg | Freq: Once | INTRAMUSCULAR | Status: AC
  Administered 2021-04-09: 1 mg via INTRAVENOUS
  Filled 2021-04-09: qty 1

## 2021-04-09 MED ORDER — TAMSULOSIN HCL 0.4 MG PO CAPS
0.4000 mg | ORAL_CAPSULE | Freq: Once | ORAL | Status: AC
  Administered 2021-04-09: 0.4 mg via ORAL
  Filled 2021-04-09: qty 1

## 2021-04-09 MED ORDER — METOCLOPRAMIDE HCL 5 MG/ML IJ SOLN
10.0000 mg | Freq: Once | INTRAMUSCULAR | Status: AC
  Administered 2021-04-09: 10 mg via INTRAVENOUS
  Filled 2021-04-09: qty 2

## 2021-04-09 MED ORDER — TAMSULOSIN HCL 0.4 MG PO CAPS: ORAL_CAPSULE | ORAL | 0 refills | Status: DC

## 2021-04-09 MED ORDER — ONDANSETRON HCL 4 MG/2ML IJ SOLN
4.0000 mg | Freq: Once | INTRAMUSCULAR | Status: AC
  Administered 2021-04-09: 4 mg via INTRAVENOUS
  Filled 2021-04-09: qty 2

## 2021-04-09 MED ORDER — METOCLOPRAMIDE HCL 10 MG PO TABS: 10.0000 mg | ORAL_TABLET | Freq: Four times a day (QID) | ORAL | 1 refills | Status: DC | PRN

## 2021-04-09 MED ORDER — HYDROMORPHONE HCL 2 MG PO TABS: 2.0000 mg | ORAL_TABLET | ORAL | 0 refills | Status: DC | PRN

## 2021-04-09 NOTE — ED Notes (Signed)
MD notified of new onset RLQ abd pain. Tender upon palpation. No masses or board like abd felt.

## 2021-04-09 NOTE — ED Notes (Signed)
Pt verbalizes understanding of discharge instructions. Opportunity for questioning and answers were provided. Armand removed by staff, pt discharged from ED to home with husband. Educated to pick up Rx. Strainer given for UA. F/u with Urology.

## 2021-04-09 NOTE — ED Notes (Signed)
Pt became sweaty and diaphoretic. Temp checked 97.8 F. Pt given fan, ice pak and ice chips.

## 2021-04-09 NOTE — ED Notes (Signed)
Patient transported to CT 

## 2021-04-09 NOTE — ED Provider Notes (Signed)
[facility] Provider Note: Georgena Spurling, MD, FACEP  CSN: [id number] MRN: [id number] ARRIVAL: 04/08/21 at [place]  Flank Pain   HISTORY OF PRESENT ILLNESS  04/09/21 12:54 AM Sydney Bailey is a 55 y.o. female with left flank pain that began about 8 PM yesterday evening.  She has a history of kidney stones and this pain is similar.  She rates it as a 10 out of 10 and describes it as sharp and shooting.  It radiates to the left lower quadrant.  Nothing makes it better or worse.  She has had associated nausea and vomiting.  She denies hematuria or dysuria.  She was given Zofran ODT on arrival for treatment of her vomiting with some improvement.   Past Medical History:  Diagnosis Date   Allergy    Anxiety    Depression    Family history of anesthesia complication    Father had difficult time waking up    Heart palpitations    PVC's   Hypothyroidism 11-08   started post- partum   Migraines    PONV (postoperative nausea and vomiting)    Difficult waking up; had bad migraine after bunionectomy   PVC's (premature ventricular contractions)    Pt saw Dr. Stanford Breed for this but pt stated it was fine    Past Surgical History:  Procedure Laterality Date   BUNIONECTOMY Bilateral    x 2 on both feet   CHOLECYSTECTOMY N/A 05/22/2013   Procedure: LAPAROSCOPIC CHOLECYSTECTOMY;  Surgeon: Harl Bowie, MD;  Location: [facility];  Service: General;  Laterality: N/A;   essure BTL     UPPER GI ENDOSCOPY      Family History  Problem Relation Age of Onset   Heart disease Father        Atrial fibrillation   Atrial fibrillation Father    Atrial fibrillation Mother    Cancer Maternal Aunt        breast   Breast cancer Maternal Aunt    Cancer Paternal Aunt        non hodgkins lymphoma   Cancer Maternal Grandmother        breast   Breast cancer Maternal Grandmother     Social History   Tobacco Use   Smoking status: Never   Smokeless  tobacco: Never  Substance Use Topics   Alcohol use: No   Drug use: No    Prior to Admission medications   Medication Sig Start Date End Date Taking? Authorizing Provider  HYDROmorphone (DILAUDID) 2 MG tablet Take 1 tablet (2 mg total) by mouth every 4 (four) hours as needed for severe pain. 04/09/21  Yes Inanna Telford, MD  metoCLOPramide (REGLAN) 10 MG tablet Take 1 tablet (10 mg total) by mouth every 6 (six) hours as needed for nausea (nausea/headache). 04/09/21  Yes Jamekia Gannett, MD  tamsulosin (FLOMAX) 0.4 MG CAPS capsule Take 1 capsule daily until stone passes. 04/09/21  Yes Arly Salminen, MD  albuterol (VENTOLIN HFA) 108 (90 Base) MCG/ACT inhaler Inhale 1 puff into the lungs every 6 (six) hours as needed for wheezing. 07/01/20   Orma Render, NP  ALPRAZolam Duanne Moron) 0.5 MG tablet Take 0.5-1 tablet up to three times daily as needed for anxiety 08/13/20   Terrilyn Saver, NP  AMBULATORY NON FORMULARY MEDICATION Progesterone capsules Take two tablets 70mg  SR progesterone capsules daily at bedtime 11/05/20   Breeback, [name] L, PA-C  AMBULATORY NON FORMULARY MEDICATION T4 190 mcg and  T3 50 mcg NON PORCINE SR capsules take one tablet daily 11/15/20   Breeback, [name] L, PA-C  B Complex Vitamins (VITAMIN B-COMPLEX PO) Take 1 tablet by mouth daily.    [provider]  citalopram (CELEXA) 40 MG tablet TAKE 1 TABLET BY MOUTH EVERY DAY. 07/20/20   Breeback, [name] L, PA-C  metaxalone (SKELAXIN) 800 MG tablet TAKE 1 TABLET (800 MG TOTAL) BY MOUTH 3 (THREE) TIMES DAILY AS NEEDED FOR MUSCLE SPASMS. 12/15/20   Breeback, Lesly Rubenstein L, PA-C  metoprolol succinate (TOPROL-XL) 25 MG 24 hr tablet TAKE 1/2 TABLET (12.5 MG TOTAL) BY MOUTH 2 (TWO) TIMES DAILY.**DOSE INCREASE** 12/15/20   Lewayne Bunting, MD  Probiotic Product (PROBIOTIC PO) Take 1 capsule by mouth daily.    [provider]  rizatriptan (MAXALT-MLT) 10 MG disintegrating tablet TAKE 1 TABLET (10 MG TOTAL) BY MOUTH ONCE AS NEEDED FOR MIGRAINE. MAY REPEAT  IN 2 HOURS IF NEEDED 11/23/20   Breeback, [name] L, PA-C    Allergies Baclofen, Cyclobenzaprine, Cyclobenzaprine hcl, Etodolac, Topamax, Topiramate, Iodine, and Prednisone   REVIEW OF SYSTEMS  Negative except as noted here or in the History of Present Illness.   PHYSICAL EXAMINATION  Initial Vital Signs Blood pressure 128/70, pulse 72, temperature 98.2 F (36.8 C), temperature source Oral, resp. rate 18, height 5' 6.5" (1.689 m), weight 65.8 kg, last menstrual period 09/01/2010, SpO2 99 %.  Examination General: Well-developed, well-nourished female in no acute distress; appearance consistent with age of record HENT: normocephalic; atraumatic Eyes: pupils equal, round and reactive to light; extraocular muscles intact Neck: supple Heart: regular rate and rhythm Lungs: clear to auscultation bilaterally Abdomen: soft; nondistended; nontender; bowel sounds present GU: No CVA tenderness Extremities: No deformity; full range of motion; pulses normal Neurologic: Awake, alert and oriented; motor function intact in all extremities and symmetric; no facial droop Skin: Warm and dry Psychiatric: Grimacing   RESULTS  Summary of this visit's results, reviewed and interpreted by myself:   EKG Interpretation  Date/Time:    Ventricular Rate:    PR Interval:    QRS Duration:   QT Interval:    QTC Calculation:   R Axis:     Text Interpretation:         Laboratory Studies: Results for orders placed or performed during the hospital encounter of 04/09/21 (from the past 24 hour(s))  Urinalysis, Routine w reflex microscopic Urine, Clean Catch     Status: Abnormal   Collection Time: 04/08/21 11:50 PM  Result Value Ref Range   Color, Urine YELLOW YELLOW   APPearance HAZY (A) CLEAR   Specific Gravity, Urine 1.018 1.005 - 1.030   pH 7.0 5.0 - 8.0   Glucose, UA NEGATIVE NEGATIVE mg/dL   Hgb urine dipstick LARGE (A) NEGATIVE   Bilirubin Urine NEGATIVE NEGATIVE   Ketones, ur 15 (A) NEGATIVE  mg/dL   Protein, ur 30 (A) NEGATIVE mg/dL   Nitrite NEGATIVE NEGATIVE   Leukocytes,Ua LARGE (A) NEGATIVE   RBC / HPF >50 (H) 0 - 5 RBC/hpf   WBC, UA 11-20 0 - 5 WBC/hpf   Bacteria, UA RARE (A) NONE SEEN   Squamous Epithelial / LPF 0-5 0 - 5   Mucus PRESENT   CBC with Differential     Status: Abnormal   Collection Time: 04/09/21 12:29 AM  Result Value Ref Range   WBC 11.2 (H) 4.0 - 10.5 K/uL   RBC 4.16 3.87 - 5.11 MIL/uL   Hemoglobin 12.6 12.0 - 15.0 g/dL  HCT 37.1 36.0 - 46.0 %   MCV 89.2 80.0 - 100.0 fL   MCH 30.3 26.0 - 34.0 pg   MCHC 34.0 30.0 - 36.0 g/dL   RDW 48.5 (L) 46.2 - 70.3 %   Platelets 289 150 - 400 K/uL   nRBC 0.0 0.0 - 0.2 %   Neutrophils Relative % 76 %   Neutro Abs 8.6 (H) 1.7 - 7.7 K/uL   Lymphocytes Relative 15 %   Lymphs Abs 1.7 0.7 - 4.0 K/uL   Monocytes Relative 7 %   Monocytes Absolute 0.8 0.1 - 1.0 K/uL   Eosinophils Relative 1 %   Eosinophils Absolute 0.1 0.0 - 0.5 K/uL   Basophils Relative 1 %   Basophils Absolute 0.1 0.0 - 0.1 K/uL   Immature Granulocytes 0 %   Abs Immature Granulocytes 0.03 0.00 - 0.07 K/uL  Comprehensive metabolic panel     Status: Abnormal   Collection Time: 04/09/21 12:29 AM  Result Value Ref Range   Sodium 137 135 - 145 mmol/L   Potassium 3.9 3.5 - 5.1 mmol/L   Chloride 102 98 - 111 mmol/L   CO2 25 22 - 32 mmol/L   Glucose, Bld 119 (H) 70 - 99 mg/dL   BUN 20 6 - 20 mg/dL   Creatinine, Ser 5.00 0.44 - 1.00 mg/dL   Calcium 9.5 8.9 - 93.8 mg/dL   Total Protein 6.9 6.5 - 8.1 g/dL   Albumin 3.8 3.5 - 5.0 g/dL   AST 15 15 - 41 U/L   ALT 12 0 - 44 U/L   Alkaline Phosphatase 84 38 - 126 U/L   Total Bilirubin 0.3 0.3 - 1.2 mg/dL   GFR, Estimated >18 >29 mL/min   Anion gap 10 5 - 15   Imaging Studies: CT Renal Stone Study  Result Date: 04/09/2021 CLINICAL DATA:  Left-sided flank pain for several hours, initial encounter EXAM: CT ABDOMEN AND PELVIS WITHOUT CONTRAST TECHNIQUE: Multidetector CT imaging of the abdomen and  pelvis was performed following the standard protocol without IV contrast. COMPARISON:  None. FINDINGS: Lower chest: No acute abnormality. Hepatobiliary: No focal liver abnormality is seen. Status post cholecystectomy. No biliary dilatation. Pancreas: Unremarkable. No pancreatic ductal dilatation or surrounding inflammatory changes. Spleen: Normal in size without focal abnormality. Adrenals/Urinary Tract: Adrenal glands are within normal limits. Right kidney shows no renal calculi or obstructive changes. Left kidney demonstrates mild hydronephrosis and proximal hydroureter which extends to the mid ureter secondary to 3-4 mm obstructing stone. More distal left ureter is within normal limits. The bladder is within normal limits as well. Stomach/Bowel: No obstructive or inflammatory changes of the colon are seen. Mild diverticular change without diverticulitis is noted. The appendix is not well visualized and may have been surgically removed. No inflammatory changes are seen. Small bowel and stomach appear within normal limits. Vascular/Lymphatic: No significant vascular findings are present. No enlarged abdominal or pelvic lymph nodes. Reproductive: Uterus is within normal limits. Bilateral Essure coils are noted and stable. No adnexal mass is seen. Other: No abdominal wall hernia or abnormality. No abdominopelvic ascites. Musculoskeletal: No acute or significant osseous findings. IMPRESSION: 3-4 mm left mid ureteral stone with obstructive change. Diverticulosis without diverticulitis. No other focal abnormality is seen. Electronically Signed   By: Alcide Clever M.D.   On: 04/09/2021 01:21    ED COURSE and MDM  Nursing notes, initial and subsequent vitals signs, including pulse oximetry, reviewed and interpreted by myself.  Vitals:   04/09/21 0230 04/09/21 0300  04/09/21 0330 04/09/21 0332  BP: 125/63 125/63    Pulse: 65 69 86 85  Resp: 16 17    Temp:      TempSrc:      SpO2: 98% 96% 96% 97%  Weight:       Height:       Medications  tamsulosin (FLOMAX) capsule 0.4 mg (has no administration in time range)  ondansetron (ZOFRAN-ODT) disintegrating tablet 4 mg (4 mg Oral Given 04/08/21 2346)  HYDROmorphone (DILAUDID) injection 1 mg (1 mg Intravenous Given 04/09/21 0056)  ondansetron (ZOFRAN) injection 4 mg (4 mg Intravenous Given 04/09/21 0056)  HYDROmorphone (DILAUDID) injection 1 mg (1 mg Intravenous Given 04/09/21 0133)  metoCLOPramide (REGLAN) injection 10 mg (10 mg Intravenous Given 04/09/21 0247)   3:43 AM Pain and nausea well controlled at this time.  Patient advised of CT findings of mid ureteral stone.  We will provide prescriptions and refer to urology if stone does not pass on its own.   PROCEDURES  Procedures   ED DIAGNOSES     ICD-10-CM   1. Ureterolithiasis  N20.1          Andres Escandon, MD 04/09/21 [phone]

## 2021-04-16 ENCOUNTER — Encounter: Payer: Self-pay | Admitting: Physician Assistant

## 2021-04-16 DIAGNOSIS — Z780 Asymptomatic menopausal state: Secondary | ICD-10-CM

## 2021-04-16 DIAGNOSIS — E039 Hypothyroidism, unspecified: Secondary | ICD-10-CM

## 2021-04-18 ENCOUNTER — Encounter: Payer: Self-pay | Admitting: Physician Assistant

## 2021-04-25 DIAGNOSIS — E039 Hypothyroidism, unspecified: Secondary | ICD-10-CM | POA: Diagnosis not present

## 2021-04-25 DIAGNOSIS — Z1159 Encounter for screening for other viral diseases: Secondary | ICD-10-CM | POA: Diagnosis not present

## 2021-04-25 DIAGNOSIS — Z780 Asymptomatic menopausal state: Secondary | ICD-10-CM | POA: Diagnosis not present

## 2021-04-26 NOTE — Progress Notes (Signed)
TSH is pretty suppressed meaning your active hormones are trending too much. Looks like we need to decrease thyroid dose.  Your progesterone level looks good. Better than 9 months ago. Lets talk tomorrow.

## 2021-04-27 ENCOUNTER — Other Ambulatory Visit: Payer: Self-pay

## 2021-04-27 ENCOUNTER — Encounter: Payer: Self-pay | Admitting: Physician Assistant

## 2021-04-27 ENCOUNTER — Ambulatory Visit (INDEPENDENT_AMBULATORY_CARE_PROVIDER_SITE_OTHER): Payer: BC Managed Care – PPO | Admitting: Physician Assistant

## 2021-04-27 VITALS — BP 107/55 | HR 76 | Temp 98.3°F | Ht 66.5 in | Wt 146.0 lb

## 2021-04-27 DIAGNOSIS — Z1159 Encounter for screening for other viral diseases: Secondary | ICD-10-CM | POA: Diagnosis not present

## 2021-04-27 DIAGNOSIS — R232 Flushing: Secondary | ICD-10-CM

## 2021-04-27 DIAGNOSIS — N200 Calculus of kidney: Secondary | ICD-10-CM | POA: Insufficient documentation

## 2021-04-27 DIAGNOSIS — Z23 Encounter for immunization: Secondary | ICD-10-CM

## 2021-04-27 DIAGNOSIS — E039 Hypothyroidism, unspecified: Secondary | ICD-10-CM

## 2021-04-27 DIAGNOSIS — M549 Dorsalgia, unspecified: Secondary | ICD-10-CM

## 2021-04-27 DIAGNOSIS — F3342 Major depressive disorder, recurrent, in full remission: Secondary | ICD-10-CM

## 2021-04-27 DIAGNOSIS — R7989 Other specified abnormal findings of blood chemistry: Secondary | ICD-10-CM

## 2021-04-27 DIAGNOSIS — N62 Hypertrophy of breast: Secondary | ICD-10-CM

## 2021-04-27 MED ORDER — CITALOPRAM HYDROBROMIDE 40 MG PO TABS: ORAL_TABLET | ORAL | 3 refills | Status: DC

## 2021-04-27 NOTE — Progress Notes (Signed)
Additional lab orders added via [employer].  Estradiol Anti-TPO Thyroglobin Hep C Req #: [id number]

## 2021-04-27 NOTE — Patient Instructions (Addendum)
Will get thyroid ultrasound.   Dietary Guidelines to Help Prevent Kidney Stones Kidney stones are deposits of minerals and salts that form inside your kidneys. Your risk of developing kidney stones may be greater depending on your diet, your lifestyle, the medicines you take, and whether you have certain medical conditions. Most people can lower their chances of developing kidney stones by following the instructions below. Your dietitian may give you more specific instructions depending on your overall health and the type of kidney stones you tend to develop. What are tips for following this plan? Reading food labels  Choose foods with "no salt added" or "low-salt" labels. Limit your salt (sodium) intake to less than 1,500 mg a day. Choose foods with calcium for each meal and snack. Try to eat about 300 mg of calcium at each meal. Foods that contain 200-500 mg of calcium a serving include: 8 oz (237 mL) of milk, calcium-fortifiednon-dairy milk, and calcium-fortifiedfruit juice. Calcium-fortified means that calcium has been added to these drinks. 8 oz (237 mL) of kefir, yogurt, and soy yogurt. 4 oz (114 g) of tofu. 1 oz (28 g) of cheese. 1 cup (150 g) of dried figs. 1 cup (91 g) of cooked broccoli. One 3 oz (85 g) can of sardines or mackerel. Most people need 1,000-1,500 mg of calcium a day. Talk to your dietitian about how much calcium is recommended for you. Shopping Buy plenty of fresh fruits and vegetables. Most people do not need to avoid fruits and vegetables, even if these foods contain nutrients that may contribute to kidney stones. When shopping for convenience foods, choose: Whole pieces of fruit. Pre-made salads with dressing on the side. Low-fat fruit and yogurt smoothies. Avoid buying frozen meals or prepared deli foods. These can be high in sodium. Look for foods with live cultures, such as yogurt and kefir. Choose high-fiber grains, such as whole-wheat breads, oat bran, and  wheat cereals. Cooking Do not add salt to food when cooking. Place a salt shaker on the table and allow each person to add his or her own salt to taste. Use vegetable protein, such as beans, textured vegetable protein (TVP), or tofu, instead of meat in pasta, casseroles, and soups. Meal planning Eat less salt, if told by your dietitian. To do this: Avoid eating processed or pre-made food. Avoid eating fast food. Eat less animal protein, including cheese, meat, poultry, or fish, if told by your dietitian. To do this: Limit the number of times you have meat, poultry, fish, or cheese each week. Eat a diet free of meat at least 2 days a week. Eat only one serving each day of meat, poultry, fish, or seafood. When you prepare animal protein, cut pieces into small portion sizes. For most meat and fish, one serving is about the size of the palm of your hand. Eat at least five servings of fresh fruits and vegetables each day. To do this: Keep fruits and vegetables on hand for snacks. Eat one piece of fruit or a handful of berries with breakfast. Have a salad and fruit at lunch. Have two kinds of vegetables at dinner. Limit foods that are high in a substance called oxalate. These include: Spinach (cooked), rhubarb, beets, sweet potatoes, and Swiss chard. Peanuts. Potato chips, french fries, and baked potatoes with skin on. Nuts and nut products. Chocolate. If you regularly take a diuretic medicine, make sure to eat at least 1 or 2 servings of fruits or vegetables that are high in potassium each day.  These include: Avocado. Banana. Orange, prune, carrot, or tomato juice. Baked potato. Cabbage. Beans and split peas. Lifestyle  Drink enough fluid to keep your urine pale yellow. This is the most important thing you can do. Spread your fluid intake throughout the day. If you drink alcohol: Limit how much you use to: 0-1 drink a day for women who are not pregnant. 0-2 drinks a day for men. Be  aware of how much alcohol is in your drink. In the [place] one drink equals one 12 oz bottle of beer (355 mL), one 5 oz glass of wine (148 mL), or one 1 oz glass of hard liquor (44 mL). Lose weight if told by your health care provider. Work with your dietitian to find an eating plan and weight loss strategies that work best for you. General information Talk to your health care provider and dietitian about taking daily supplements. You may be told the following depending on your health and the cause of your kidney stones: Not to take supplements with vitamin C. To take a calcium supplement. To take a daily probiotic supplement. To take other supplements such as magnesium, fish oil, or vitamin B6. Take over-the-counter and prescription medicines only as told by your health care provider. These include supplements. What foods should I limit? Limit your intake of the following foods, or eat them as told by your dietitian. Vegetables Spinach. Rhubarb. Beets. Canned vegetables. Rosita Fire. Olives. Baked potatoes with skin. Grains Wheat bran. Baked goods. Salted crackers. Cereals high in sugar. Meats and other proteins Nuts. Nut butters. Large portions of meat, poultry, or fish. Salted, precooked, or cured meats, such as sausages, meat loaves, and hot dogs. Dairy Cheese. Beverages Regular soft drinks. Regular vegetable juice. Seasonings and condiments Seasoning blends with salt. Salad dressings. Soy sauce. Ketchup. Barbecue sauce. Other foods Canned soups. Canned pasta sauce. Casseroles. Pizza. Lasagna. Frozen meals. Potato chips. [place] fries. The items listed above may not be a complete list of foods and beverages you should limit. Contact a dietitian for more information. What foods should I avoid? Talk to your dietitian about specific foods you should avoid based on the type of kidney stones you have and your overall health. Fruits Grapefruit. The item listed above may not be a complete list  of foods and beverages you should avoid. Contact a dietitian for more information. Summary Kidney stones are deposits of minerals and salts that form inside your kidneys. You can lower your risk of kidney stones by making changes to your diet. The most important thing you can do is drink enough fluid. Drink enough fluid to keep your urine pale yellow. Talk to your dietitian about how much calcium you should have each day, and eat less salt and animal protein as told by your dietitian. This information is not intended to replace advice given to you by your health care provider. Make sure you discuss any questions you have with your health care provider. Document Revised: 05/15/2019 Document Reviewed: 05/15/2019 Elsevier Patient Education  2022 Elsevier Inc.   Influenza (Flu) Vaccine (Inactivated or Recombinant): What You Need to Know 1. Why get vaccinated? Influenza vaccine can prevent influenza (flu). Flu is a contagious disease that spreads around the [place] every year, usually between October and May. Anyone can get the flu, but it is more dangerous for some people. Infants and young children, people 33 years and older, pregnant people, and people with certain health conditions or a weakened immune system are at greatest risk of flu  complications. Pneumonia, bronchitis, sinus infections, and ear infections are examples of flu-related complications. If you have a medical condition, such as heart disease, cancer, or diabetes, flu can make it worse. Flu can cause fever and chills, sore throat, muscle aches, fatigue, cough, headache, and runny or stuffy nose. Some people may have vomiting and diarrhea, though this is more common in children than adults. In an average year, thousands of people in the [place] die from flu, and many more are hospitalized. Flu vaccine prevents millions of illnesses and flu-related visits to the doctor each year. 2. Influenza vaccines CDC recommends  everyone 6 months and older get vaccinated every flu season. Children 6 months through 77 years of age may need 2 doses during a single flu season. Everyone else needs only 1 dose each flu season. It takes about 2 weeks for protection to develop after vaccination. There are many flu viruses, and they are always changing. Each year a new flu vaccine is made to protect against the influenza viruses believed to be likely to cause disease in the upcoming flu season. Even when the vaccine doesn't exactly match these viruses, it may still provide some protection. Influenza vaccine does not cause flu. Influenza vaccine may be given at the same time as other vaccines. 3. Talk with your health care provider Tell your vaccination provider if the person getting the vaccine: Has had an allergic reaction after a previous dose of influenza vaccine, or has any severe, life-threatening allergies Has ever had Guillain-Barr Syndrome (also called "GBS") In some cases, your health care provider may decide to postpone influenza vaccination until a future visit. Influenza vaccine can be administered at any time during pregnancy. People who are or will be pregnant during influenza season should receive inactivated influenza vaccine. People with minor illnesses, such as a cold, may be vaccinated. People who are moderately or severely ill should usually wait until they recover before getting influenza vaccine. Your health care provider can give you more information. 4. Risks of a vaccine reaction Soreness, redness, and swelling where the shot is given, fever, muscle aches, and headache can happen after influenza vaccination. There may be a very small increased risk of Guillain-Barr Syndrome (GBS) after inactivated influenza vaccine (the flu shot). Young children who get the flu shot along with pneumococcal vaccine (PCV13) and/or DTaP vaccine at the same time might be slightly more likely to have a seizure caused by fever.  Tell your health care provider if a child who is getting flu vaccine has ever had a seizure. People sometimes faint after medical procedures, including vaccination. Tell your provider if you feel dizzy or have vision changes or ringing in the ears. As with any medicine, there is a very remote chance of a vaccine causing a severe allergic reaction, other serious injury, or death. 5. What if there is a serious problem? An allergic reaction could occur after the vaccinated person leaves the clinic. If you see signs of a severe allergic reaction (hives, swelling of the face and throat, difficulty breathing, a fast heartbeat, dizziness, or weakness), call 9-1-1 and get the person to the nearest hospital. For other signs that concern you, call your health care provider. Adverse reactions should be reported to the Vaccine Adverse Event Reporting System (VAERS). Your health care provider will usually file this report, or you can do it yourself. Visit the VAERS website at www.vaers.LAgents.no or call (276) 160-8164. VAERS is only for reporting reactions, and VAERS staff members do not give medical advice.  6. The National Vaccine Injury Compensation Program The [employer] Vaccine Injury Compensation Program (VICP) is a federal program that was created to compensate people who may have been injured by certain vaccines. Claims regarding alleged injury or death due to vaccination have a time limit for filing, which may be as short as two years. Visit the VICP website at SpiritualWord.at or call 204-091-3815 to learn about the program and about filing a claim. 7. How can I learn more? Ask your health care provider. Call your local or state health department. Visit the website of the Food and Drug Administration (FDA) for vaccine package inserts and additional information at FinderList.no. Contact the Centers for Disease Control and Prevention (CDC): Call (252) 107-6081  (1-800-CDC-INFO) or Visit CDC's website at BiotechRoom.com.cy. Vaccine Information Statement Inactivated Influenza Vaccine (01/09/2020) This information is not intended to replace advice given to you by your health care provider. Make sure you discuss any questions you have with your health care provider. Document Revised: 02/10/2021 Document Reviewed: 02/10/2021 Elsevier Patient Education  2022 [employer].

## 2021-04-27 NOTE — Progress Notes (Signed)
Subjective:    Patient ID: Sydney Bailey, female    DOB: Sep 23, 1965, 55 y.o.   MRN: [id number]  HPI Patient is a 55 year old female with PVCs, migraines, hot flashes, menopausal symptoms, hypothyroidism who presents to the clinic for follow-up.  Patient would like to go over her most recent hormonal labs.  She has been having more sweating, hot flashes, vaginal dryness.  Mood is controlled on celexa. No concerns.   Pt has had upper back pain for years. Treated with massage/NSAIDs. 40DD would like consult for breast reduction.   Recent hx of kidney stones. Went to ED had CT scan. Passed with hydration, flomax, and pain medication.   .. Active Ambulatory Problems    Diagnosis Date Noted   Hypothyroidism 11/22/2007   PANIC ATTACK 10/02/2008   Migraine without aura 11/22/2007   MENORRHAGIA 07/27/2008   Palpitations 03/19/2013   Dyspnea 03/19/2013   Depression 08/11/2014   Ventricular quadrigeminy 04/21/2016   PVC's (premature ventricular contractions) 04/21/2016   Low serum progesterone 06/09/2016   Menopause 03/27/2017   ETD (Eustachian tube dysfunction), right 03/27/2017   No energy 10/14/2017   Vertigo 10/14/2017   Hyperlipidemia LDL goal <160 05/07/2018   Persistent shortness of breath after COVID-19 07/01/2020   Recurrent major depressive disorder, in full remission (HCC) 07/20/2020   Right hip pain 10/11/2020   Chronic right-sided low back pain with right-sided sciatica 10/11/2020   DDD (degenerative disc disease), lumbar 10/13/2020   Facet arthritis of lumbar region 10/13/2020   Kidney stones 04/27/2021   Hot flashes 05/03/2021   Resolved Ambulatory Problems    Diagnosis Date Noted   SYNCOPE 02/20/2008   POSTURAL LIGHTHEADEDNESS 12/25/2007   FATIGUE 07/27/2008   Past Medical History:  Diagnosis Date   Allergy    Anxiety    Family history of anesthesia complication    Heart palpitations    Migraines    PONV (postoperative nausea and vomiting)        Review of Systems See HPI.     Objective:   Physical Exam Vitals reviewed.  Constitutional:      Appearance: Normal appearance.  HENT:     Head: Normocephalic.  Cardiovascular:     Rate and Rhythm: Normal rate and regular rhythm.     Pulses: Normal pulses.     Heart sounds: Normal heart sounds.  Pulmonary:     Effort: Pulmonary effort is normal.     Breath sounds: Normal breath sounds.  Musculoskeletal:     Right lower leg: No edema.     Left lower leg: No edema.  Neurological:     General: No focal deficit present.     Mental Status: She is alert and oriented to person, place, and time.  Psychiatric:        Mood and Affect: Mood normal.   .. Depression screen [facility] 2/9 10/11/2020 08/13/2020 07/16/2019 05/07/2018 10/12/2017  Decreased Interest 0 1 0 0 1  Down, Depressed, Hopeless 0 1 1 0 0  PHQ - 2 Score 0 2 1 0 1  Altered sleeping - 1 1 1 2   Tired, decreased energy - 1 1 1 2   Change in appetite - 1 1 0 2  Feeling bad or failure about yourself  - 3 0 0 0  Trouble concentrating - 0 0 0 1  Moving slowly or fidgety/restless - 0 0 0 0  Suicidal thoughts - 0 0 0 0  PHQ-9 Score - 8 4 2 8   Difficult doing work/chores -  Extremely dIfficult Not difficult at all Not difficult at all Somewhat difficult   .Marland Kitchen GAD 7 : Generalized Anxiety Score 08/13/2020 07/16/2019 05/07/2018 10/12/2017  Nervous, Anxious, on Edge 2 1 1 1   Control/stop worrying 1 0 0 0  Worry too much - different things 1 0 0 0  Trouble relaxing 1 0 1 1  Restless 0 0 1 0  Easily annoyed or irritable 2 0 1 0  Afraid - awful might happen 0 0 1 0  Total GAD 7 Score 7 1 5 2   Anxiety Difficulty Somewhat difficult Not difficult at all Not difficult at all Not difficult at all           Assessment & Plan:   Sydney Bailey was seen today for results.  Diagnoses and all orders for this visit:  Hypothyroidism, unspecified type -     Marland Kitchen THYROID; Future  Need for influenza vaccination -     Flu Vaccine QUAD 36mo+IM  (Fluarix, Fluzone & Alfiuria Quad PF)  Need for hepatitis C screening test -     Hepatitis C antibody  Recurrent major depressive disorder, in full remission (HCC) -     citalopram (CELEXA) 40 MG tablet; TAKE 1 TABLET BY MOUTH EVERY DAY.  Hot flashes  Low serum progesterone  Kidney stones  Thyroid labs have been very variable recently.  Discussed most recent labs.  Get thyroid ultrasound.  Will add antibodies to thyroid.  [facility] for suggestion on new dose since patient is in hyper thyroid state.   Will add estrogen testing as well. Discussed to see if adding estrogen could help with some of her symptoms.   Refilled celexa.   Discussed kidney stone prevention.

## 2021-05-02 LAB — THYROID PEROXIDASE ANTIBODY: Thyroperoxidase Ab SerPl-aCnc: 271 IU/mL — ABNORMAL HIGH (ref <9)

## 2021-05-02 LAB — THYROGLOBULIN PANEL
Thyroglobulin Ab: 5 IU/mL — ABNORMAL HIGH (ref <=1)
Thyroglobulin: 0.2 ng/mL — ABNORMAL LOW

## 2021-05-02 LAB — THYROID PANEL WITH TSH
Free Thyroxine Index: 3.6 (ref 1.4–3.8)
T3 Uptake: 36 % — ABNORMAL HIGH (ref 22–35)
T4, Total: 10 ug/dL (ref 5.1–11.9)
TSH: <0.01 mIU/L — ABNORMAL LOW

## 2021-05-02 LAB — HEPATITIS C ANTIBODY
Hepatitis C Ab: NONREACTIVE
SIGNAL TO CUT-OFF: 0.02 (ref <1.00)

## 2021-05-02 LAB — PROGESTERONE: Progesterone: 7.9 ng/mL

## 2021-05-02 LAB — ESTRADIOL: Estradiol: <15 pg/mL

## 2021-05-02 NOTE — Progress Notes (Signed)
I don't think this has been handled I need [facility] in [place] to be called and let them know we are sending labs to be adjusted and I will sign rx.   Let patient know that her antibodies against thyroid are high this is more autoimmune thyroid disease.  Her estrogen level is VERY low like no estrogen as compared to a few years ago.

## 2021-05-03 ENCOUNTER — Encounter: Payer: Self-pay | Admitting: Physician Assistant

## 2021-05-03 ENCOUNTER — Other Ambulatory Visit: Payer: Self-pay

## 2021-05-03 ENCOUNTER — Ambulatory Visit (INDEPENDENT_AMBULATORY_CARE_PROVIDER_SITE_OTHER): Payer: BC Managed Care – PPO | Admitting: Physician Assistant

## 2021-05-03 VITALS — BP 117/62 | HR 120 | Temp 98.6°F | Ht 66.5 in | Wt 146.0 lb

## 2021-05-03 DIAGNOSIS — R319 Hematuria, unspecified: Secondary | ICD-10-CM

## 2021-05-03 DIAGNOSIS — N62 Hypertrophy of breast: Secondary | ICD-10-CM | POA: Insufficient documentation

## 2021-05-03 DIAGNOSIS — Z87442 Personal history of urinary calculi: Secondary | ICD-10-CM | POA: Diagnosis not present

## 2021-05-03 DIAGNOSIS — R109 Unspecified abdominal pain: Secondary | ICD-10-CM

## 2021-05-03 DIAGNOSIS — N200 Calculus of kidney: Secondary | ICD-10-CM

## 2021-05-03 DIAGNOSIS — M549 Dorsalgia, unspecified: Secondary | ICD-10-CM | POA: Insufficient documentation

## 2021-05-03 DIAGNOSIS — R112 Nausea with vomiting, unspecified: Secondary | ICD-10-CM

## 2021-05-03 DIAGNOSIS — R232 Flushing: Secondary | ICD-10-CM | POA: Insufficient documentation

## 2021-05-03 LAB — POCT URINALYSIS DIPSTICK
Bilirubin, UA: NEGATIVE
Glucose, UA: NEGATIVE
Leukocytes, UA: NEGATIVE
Nitrite, UA: NEGATIVE
Protein, UA: NEGATIVE
Spec Grav, UA: >=1.03 — AB (ref 1.010–1.025)
Urobilinogen, UA: 0.2 E.U./dL
pH, UA: 5.5 (ref 5.0–8.0)

## 2021-05-03 MED ORDER — KETOROLAC TROMETHAMINE 60 MG/2ML IM SOLN
60.0000 mg | Freq: Once | INTRAMUSCULAR | Status: AC
  Administered 2021-05-03: 60 mg via INTRAMUSCULAR

## 2021-05-03 MED ORDER — ONDANSETRON 8 MG PO TBDP: 8.0000 mg | ORAL_TABLET | Freq: Three times a day (TID) | ORAL | 1 refills | Status: DC | PRN

## 2021-05-03 NOTE — Progress Notes (Signed)
Subjective:    Patient ID: MAKINZY LEADINGHAM, female    DOB: 11/01/1965, 55 y.o.   MRN: [id number]  HPI Pt is a 55 yo female with recent hx of left sided kidney stone confirmed by CT scan about 53mm by ED on 04/09/2021. Her pain resolved in ED but she does not remember passing the stone. This weekend around Friday she started having left flank pain that has progressively worsened. She had flomax, reglan, and dilaudid that she has been taking but only making it bearable but not eminating the pain. Dilaudid also makes her really sleepy. She has not been able to work. She has also been vomiting. No fever or chills. She also reports hard time urinating. Lots of pelvic pressure.   .. Active Ambulatory Problems    Diagnosis Date Noted   Hypothyroidism 11/22/2007   PANIC ATTACK 10/02/2008   Migraine without aura 11/22/2007   MENORRHAGIA 07/27/2008   Palpitations 03/19/2013   Dyspnea 03/19/2013   Depression 08/11/2014   Ventricular quadrigeminy 04/21/2016   PVC's (premature ventricular contractions) 04/21/2016   Low serum progesterone 06/09/2016   Menopause 03/27/2017   ETD (Eustachian tube dysfunction), right 03/27/2017   No energy 10/14/2017   Vertigo 10/14/2017   Hyperlipidemia LDL goal <160 05/07/2018   Persistent shortness of breath after COVID-19 07/01/2020   Recurrent major depressive disorder, in full remission ([place]) 07/20/2020   Right hip pain 10/11/2020   Chronic right-sided low back pain with right-sided sciatica 10/11/2020   DDD (degenerative disc disease), lumbar 10/13/2020   Facet arthritis of lumbar region 10/13/2020   Kidney stone on left side 04/27/2021   Hot flashes 05/03/2021   Large breasts 05/03/2021   Upper back pain 05/03/2021   Hematuria 05/04/2021   Left flank pain 05/04/2021   Nausea and vomiting 05/04/2021   History of kidney stones 05/04/2021   Resolved Ambulatory Problems    Diagnosis Date Noted   SYNCOPE 02/20/2008   POSTURAL LIGHTHEADEDNESS 12/25/2007    FATIGUE 07/27/2008   Past Medical History:  Diagnosis Date   Allergy    Anxiety    Family history of anesthesia complication    Heart palpitations    Migraines    PONV (postoperative nausea and vomiting)     Review of Systems    See HPI.  Objective:   Physical Exam Vitals reviewed.  Constitutional:      Comments: She is pale and appears fatigued and tense body position  Cardiovascular:     Rate and Rhythm: Tachycardia present.     Pulses: Normal pulses.  Pulmonary:     Effort: Pulmonary effort is normal.  Abdominal:     General: There is no distension.     Palpations: Abdomen is soft. There is no mass.     Tenderness: There is abdominal tenderness. There is left CVA tenderness. There is no right CVA tenderness, guarding or rebound.     Hernia: No hernia is present.  Neurological:     General: No focal deficit present.     Mental Status: She is alert and oriented to person, place, and time.  Psychiatric:        Mood and Affect: Mood normal.      .. Results for orders placed or performed in visit on 05/03/21  POCT urinalysis dipstick  Result Value Ref Range   Color, UA Yellow    Clarity, UA Clear    Glucose, UA Negative Negative   Bilirubin, UA Negative    Ketones, UA 15mg /dL  Spec Grav, UA >=1.030 (A) 1.010 - 1.025   Blood, UA Large    pH, UA 5.5 5.0 - 8.0   Protein, UA Negative Negative   Urobilinogen, UA 0.2 0.2 or 1.0 E.U./dL   Nitrite, UA Negative    Leukocytes, UA Negative Negative   Appearance     Odor         Assessment & Plan:  Marland KitchenMarland KitchenMaryum was seen today for acute visit.  Diagnoses and all orders for this visit:  Kidney stone on left side  History of kidney stones -     ketorolac (TORADOL) injection 60 mg  Left flank pain -     POCT urinalysis dipstick -     ketorolac (TORADOL) injection 60 mg  Hematuria, unspecified type  Nausea and vomiting, unspecified vomiting type -     ondansetron (ZOFRAN-ODT) 8 MG disintegrating tablet; Take  1 tablet (8 mg total) by mouth every 8 (eight) hours as needed for nausea.  Recently had CT confirmed 65mm kidney stone left  ? If this is another stone or the same one Toradol given in office today Continue flomax/dilaudid ok to take up to 4mg  Stop reglan Try zofran Skelaxin to help with muscle tightness and tension due to pain Given hat to try to capture stone Push fluids Referral made to urology

## 2021-05-03 NOTE — Patient Instructions (Addendum)
Dilaudid take 2mg  to 4mg  every 4-6 hours.  Stay on flomax daily.  Zofran as needed for nausea.  Skelaxin for muscle relaxer.   Kidney Stones Kidney stones are solid, rock-like deposits that form inside of the kidneys. The kidneys are a pair of organs that make urine. A kidney stone may form in a kidney and move into other parts of the urinary tract, including the tubes that connect the kidneys to the bladder (ureters), the bladder, and the tube that carries urine out of the body (urethra). As the stone moves through these areas, it can cause intense pain and block the flow of urine. Kidney stones are created when high levels of certain minerals are found in the urine. The stones are usually passed out of the body through urination, but in some cases, medical treatment may be needed to remove them. What are the causes? Kidney stones may be caused by: A condition in which certain glands produce too much parathyroid hormone (primary hyperparathyroidism), which causes too much calcium buildup in the blood. A buildup of uric acid crystals in the bladder (hyperuricosuria). Uric acid is a chemical that the body produces when you eat certain foods. It usually exits the body in the urine. Narrowing (stricture) of one or both of the ureters. A kidney blockage that is present at birth (congenital obstruction). Past surgery on the kidney or the ureters, such as gastric bypass surgery. What increases the risk? The following factors may make you more likely to develop this condition: Having had a kidney stone in the past. Having a family history of kidney stones. Not drinking enough water. Eating a diet that is high in protein, salt (sodium), or sugar. Being overweight or obese. What are the signs or symptoms? Symptoms of a kidney stone may include: Pain in the side of the abdomen, right below the ribs (flank pain). Pain usually spreads (radiates) to the groin. Needing to urinate frequently or  urgently. Painful urination. Blood in the urine (hematuria). Nausea. Vomiting. Fever and chills. How is this diagnosed? This condition may be diagnosed based on: Your symptoms and medical history. A physical exam. Blood tests. Urine tests. These may be done before and after the stone passes out of your body through urination. Imaging tests, such as a CT scan, abdominal X-ray, or ultrasound. A procedure to examine the inside of the bladder (cystoscopy). How is this treated? Treatment for kidney stones depends on the size, location, and makeup of the stones. Kidney stones will often pass out of the body through urination. You may need to: Increase your fluid intake to help pass the stone. In some cases, you may be given fluids through an IV and may need to be monitored at the hospital. Take medicine for pain. Make changes in your diet to help prevent kidney stones from coming back. Sometimes, medical procedures are needed to remove a kidney stone. This may involve: A procedure to break up kidney stones using: A focused beam of light (laser therapy). Shock waves (extracorporeal shock wave lithotripsy). Surgery to remove kidney stones. This may be needed if you have severe pain or have stones that block your urinary tract. Follow these instructions at home: Medicines Take over-the-counter and prescription medicines only as told by your health care provider. Ask your health care provider if the medicine prescribed to you requires you to avoid driving or using heavy machinery. Eating and drinking Drink enough fluid to keep your urine pale yellow. You may be instructed to drink at least  8-10 glasses of water each day. This will help you pass the kidney stone. If directed, change your diet. This may include: Limiting how much sodium you eat. Eating more fruits and vegetables. Limiting how much animal protein--such as red meat, poultry, fish, and eggs--you eat. Follow instructions from your  health care provider about eating or drinking restrictions. General instructions Collect urine samples as told by your health care provider. You may need to collect a urine sample: 24 hours after you pass the stone. 8-12 weeks after passing the kidney stone, and every 6-12 months after that. Strain your urine every time you urinate, for as long as directed. Use the strainer that your health care provider recommends. Do not throw out the kidney stone after passing it. Keep the stone so it can be tested by your health care provider. Testing the makeup of your kidney stone may help prevent you from getting kidney stones in the future. Keep all follow-up visits as told by your health care provider. This is important. You may need follow-up X-rays or ultrasounds to make sure that your stone has passed. How is this prevented? To prevent another kidney stone: Drink enough fluid to keep your urine pale yellow. This is the best way to prevent kidney stones. Eat a healthy diet and follow recommendations from your health care provider about foods to avoid. You may be instructed to eat a low-protein diet. Recommendations vary depending on the type of kidney stone that you have. Maintain a healthy weight. Where to find more information National Kidney Foundation (NKF): www.kidney.org [facility]): www.urologyhealth.org Contact a health care provider if: You have pain that gets worse or does not get better with medicine. Get help right away if: You have a fever or chills. You develop severe pain. You develop new abdominal pain. You faint. You are unable to urinate. Summary Kidney stones are solid, rock-like deposits that form inside of the kidneys. Kidney stones can cause nausea, vomiting, blood in the urine, abdominal pain, and the urge to urinate frequently. Treatment for kidney stones depends on the size, location, and makeup of the stones. Kidney stones will often pass out of the  body through urination. Kidney stones can be prevented by drinking enough fluids, eating a healthy diet, and maintaining a healthy weight. This information is not intended to replace advice given to you by your health care provider. Make sure you discuss any questions you have with your health care provider. Document Revised: 10/04/2018 Document Reviewed: 10/08/2018 Elsevier Patient Education  2022 [employer].

## 2021-05-04 ENCOUNTER — Other Ambulatory Visit: Payer: Self-pay

## 2021-05-04 ENCOUNTER — Ambulatory Visit (INDEPENDENT_AMBULATORY_CARE_PROVIDER_SITE_OTHER): Payer: BC Managed Care – PPO

## 2021-05-04 ENCOUNTER — Encounter: Payer: Self-pay | Admitting: Physician Assistant

## 2021-05-04 DIAGNOSIS — E039 Hypothyroidism, unspecified: Secondary | ICD-10-CM

## 2021-05-04 DIAGNOSIS — Z87442 Personal history of urinary calculi: Secondary | ICD-10-CM | POA: Insufficient documentation

## 2021-05-04 DIAGNOSIS — R946 Abnormal results of thyroid function studies: Secondary | ICD-10-CM | POA: Diagnosis not present

## 2021-05-04 DIAGNOSIS — R319 Hematuria, unspecified: Secondary | ICD-10-CM | POA: Insufficient documentation

## 2021-05-04 DIAGNOSIS — R109 Unspecified abdominal pain: Secondary | ICD-10-CM | POA: Insufficient documentation

## 2021-05-04 DIAGNOSIS — R590 Localized enlarged lymph nodes: Secondary | ICD-10-CM | POA: Diagnosis not present

## 2021-05-04 DIAGNOSIS — R112 Nausea with vomiting, unspecified: Secondary | ICD-10-CM | POA: Insufficient documentation

## 2021-05-04 NOTE — Progress Notes (Signed)
You do have some bilateral cervical lymph nodes that appear reactive.  No nodules Your thyroid does appear inflamed could be from your body attacking it or even from a recent virus that started the attack. Recheck TSH 6 weeks after altering dose of thyroid medication.

## 2021-05-04 NOTE — Progress Notes (Signed)
Per provider's request TSH ordered.

## 2021-05-05 ENCOUNTER — Encounter: Payer: Self-pay | Admitting: Physician Assistant

## 2021-05-05 DIAGNOSIS — N201 Calculus of ureter: Secondary | ICD-10-CM | POA: Diagnosis not present

## 2021-05-06 ENCOUNTER — Other Ambulatory Visit: Payer: Self-pay

## 2021-05-06 DIAGNOSIS — E039 Hypothyroidism, unspecified: Secondary | ICD-10-CM

## 2021-05-06 MED ORDER — AMBULATORY NON FORMULARY MEDICATION: 3 refills | Status: DC

## 2021-05-18 NOTE — Telephone Encounter (Signed)
In the future we will just need to call after we fax anything to this pharmacy to ensure that they have received correspondence from [place]. Will put a flag on the chart, so if someone is covering they are aware.

## 2021-05-20 ENCOUNTER — Ambulatory Visit: Payer: BC Managed Care – PPO | Admitting: Plastic Surgery

## 2021-05-20 ENCOUNTER — Other Ambulatory Visit: Payer: Self-pay

## 2021-05-20 ENCOUNTER — Encounter: Payer: Self-pay | Admitting: Plastic Surgery

## 2021-05-20 VITALS — BP 115/80 | HR 89 | Ht 66.5 in | Wt 144.6 lb

## 2021-05-20 DIAGNOSIS — M5136 Other intervertebral disc degeneration, lumbar region: Secondary | ICD-10-CM

## 2021-05-20 DIAGNOSIS — N62 Hypertrophy of breast: Secondary | ICD-10-CM | POA: Diagnosis not present

## 2021-05-20 DIAGNOSIS — M25551 Pain in right hip: Secondary | ICD-10-CM

## 2021-05-20 DIAGNOSIS — G43009 Migraine without aura, not intractable, without status migrainosus: Secondary | ICD-10-CM | POA: Diagnosis not present

## 2021-05-20 DIAGNOSIS — M47816 Spondylosis without myelopathy or radiculopathy, lumbar region: Secondary | ICD-10-CM | POA: Diagnosis not present

## 2021-05-20 DIAGNOSIS — M51369 Other intervertebral disc degeneration, lumbar region without mention of lumbar back pain or lower extremity pain: Secondary | ICD-10-CM

## 2021-05-20 DIAGNOSIS — M549 Dorsalgia, unspecified: Secondary | ICD-10-CM

## 2021-05-20 NOTE — Progress Notes (Signed)
Patient ID: Sydney Bailey, female    DOB: 10-15-1965, 55 y.o.   MRN: [id number]   Chief Complaint  Patient presents with   Advice Only    Mammary Hyperplasia: The patient is a 55 y.o. female with a history of mammary hyperplasia for several years.  She has extremely large breasts causing symptoms that include the following: Back pain in the upper and lower back, including neck pain. She pulls or pins her bra straps to provide better lift and relief of the pressure and pain. She notices relief by holding her breast up manually.  Her shoulder straps cause grooves and pain and pressure that requires padding for relief. Pain medication is sometimes required with motrin and tylenol.  Activities that are hindered by enlarged breasts include: exercise and running.  She has tried supportive clothing as well as fitted bras without improvement.  She has a long history of neck and back pain.  She has been to a physical therapist for hip pain and she has been to a chiropractic physician on a number of occasions.  Her breasts are extremely large and fairly symmetric with the right slightly longer.  She has hyperpigmentation of the inframammary area on both sides.  The sternal to nipple distance on the right is 33 cm and the left is 31 cm.  The IMF distance is 17 cm.  She is 5 feet 6 inches tall and weighs 144 pounds.  The BMI = 23.3 kg/m.  Preoperative bra size = 40 DD cup. She would like to be a C cup.  The estimated excess breast tissue to be removed at the time of surgery = 400 grams on the left and 400 grams on the right.  Mammogram history: 10/22 negative.  Family history of breast cancer:  yes.  Tobacco use:  none.   The patient expresses the desire to pursue surgical intervention. Past surgical history is positive for cholecystectomy and bilateral foot surgery.    Review of Systems  Constitutional:  Positive for activity change. Negative for appetite change.  Eyes: Negative.   Respiratory:   Negative for chest tightness and shortness of breath.   Gastrointestinal: Negative.   Endocrine: Negative.   Genitourinary: Negative.   Musculoskeletal:  Positive for back pain and neck pain.  Neurological:  Positive for headaches.  Hematological: Negative.   Psychiatric/Behavioral: Negative.     Past Medical History:  Diagnosis Date   Allergy    Anxiety    Depression    Family history of anesthesia complication    Father had difficult time waking up    Heart palpitations    PVC's   Hypothyroidism 11-08   started post- partum   Migraines    PONV (postoperative nausea and vomiting)    Difficult waking up; had bad migraine after bunionectomy   PVC's (premature ventricular contractions)    Pt saw Dr. Jens Som for this but pt stated it was fine    Past Surgical History:  Procedure Laterality Date   BUNIONECTOMY Bilateral    x 2 on both feet   CHOLECYSTECTOMY N/A 05/22/2013   Procedure: LAPAROSCOPIC CHOLECYSTECTOMY;  Surgeon: Shelly Rubenstein, MD;  Location: [facility] OR;  Service: General;  Laterality: N/A;   essure BTL     UPPER GI ENDOSCOPY        Current Outpatient Medications:    albuterol (VENTOLIN HFA) 108 (90 Base) MCG/ACT inhaler, Inhale 1 puff into the lungs every 6 (six) hours as needed for wheezing., Disp:  8 g, Rfl: 3   ALPRAZolam (XANAX) 0.5 MG tablet, Take 0.5-1 tablet up to three times daily as needed for anxiety, Disp: 15 tablet, Rfl: 0   AMBULATORY NON FORMULARY MEDICATION, Progesterone capsules Take two tablets 70mg  SR progesterone capsules daily at bedtime, Disp: 180 capsule, Rfl: 11   AMBULATORY NON FORMULARY MEDICATION, T4 190 mcg and T3 50 mcg NON PORCINE SR capsules take one tablet daily, Disp: 90 capsule, Rfl: 3   B Complex Vitamins (VITAMIN B-COMPLEX PO), Take 1 tablet by mouth daily., Disp: , Rfl:    citalopram (CELEXA) 40 MG tablet, TAKE 1 TABLET BY MOUTH EVERY DAY., Disp: 90 tablet, Rfl: 3   metaxalone (SKELAXIN) 800 MG tablet, TAKE 1 TABLET (800 MG  TOTAL) BY MOUTH 3 (THREE) TIMES DAILY AS NEEDED FOR MUSCLE SPASMS., Disp: 90 tablet, Rfl: 0   metoprolol succinate (TOPROL-XL) 25 MG 24 hr tablet, TAKE 1/2 TABLET (12.5 MG TOTAL) BY MOUTH 2 (TWO) TIMES DAILY.**DOSE INCREASE**, Disp: 90 tablet, Rfl: 3   ondansetron (ZOFRAN-ODT) 8 MG disintegrating tablet, Take 1 tablet (8 mg total) by mouth every 8 (eight) hours as needed for nausea., Disp: 20 tablet, Rfl: 1   Probiotic Product (PROBIOTIC PO), Take 1 capsule by mouth daily., Disp: , Rfl:    rizatriptan (MAXALT-MLT) 10 MG disintegrating tablet, TAKE 1 TABLET (10 MG TOTAL) BY MOUTH ONCE AS NEEDED FOR MIGRAINE. MAY REPEAT IN 2 HOURS IF NEEDED, Disp: 12 tablet, Rfl: 5   Objective:   There were no vitals filed for this visit.  Physical Exam Vitals and nursing note reviewed.  Constitutional:      Appearance: Normal appearance.  HENT:     Head: Normocephalic and atraumatic.  Cardiovascular:     Rate and Rhythm: Normal rate.     Pulses: Normal pulses.  Pulmonary:     Effort: Pulmonary effort is normal. No respiratory distress.     Breath sounds: No wheezing.  Abdominal:     General: There is no distension.     Palpations: Abdomen is soft.     Tenderness: There is no abdominal tenderness. There is no guarding.  Musculoskeletal:        General: No swelling or deformity.  Skin:    General: Skin is warm.     Capillary Refill: Capillary refill takes less than 2 seconds.     Coloration: Skin is not jaundiced.     Findings: No bruising or lesion.  Neurological:     Mental Status: She is alert and oriented to person, place, and time.  Psychiatric:        Mood and Affect: Mood normal.        Behavior: Behavior normal.        Thought Content: Thought content normal.        Judgment: Judgment normal.    Assessment & Plan:  Migraine without aura and without status migrainosus, not intractable  Facet arthritis of lumbar region  DDD (degenerative disc disease), lumbar  Large  breasts  Right hip pain  Upper back pain  The procedure the patient selected and that was best for the patient was discussed. The risk were discussed and include but not limited to the following:  Breast asymmetry, fluid accumulation, firmness of the breast, inability to breast feed, loss of nipple or areola, skin loss, change in skin and nipple sensation, fat necrosis of the breast tissue, bleeding, infection and healing delay.  There are risks of anesthesia and injury to nerves or blood vessels.  Allergic reaction to tape, suture and skin glue are possible.  There will be swelling.  Any of these can lead to the need for revisional surgery.  A breast reduction has potential to interfere with diagnostic procedures in the future.  This procedure is best done when the breast is fully developed.  Changes in the breast will continue to occur over time: pregnancy, weight gain or weigh loss.    Total time: 40 minutes. This includes time spent with the patient during the visit as well as time spent before and after the visit reviewing the chart, documenting the encounter and ordering pertinent studies. and literature emailed to the patient.   Physical therapy:  ordered Mammogram:  requested release of information   The patient is a good candidate for bilateral breast reduction.  We will go ahead and get the physical therapy started and requested release of information for the mammograms.  We will need a copy of the mammogram prior to surgery.  Pictures were obtained of the patient and placed in the chart with the patient's or guardian's permission.   Alena Bills Kyzer Blowe, DO

## 2021-05-21 ENCOUNTER — Encounter: Payer: Self-pay | Admitting: Physician Assistant

## 2021-06-02 ENCOUNTER — Other Ambulatory Visit: Payer: Self-pay | Admitting: Physician Assistant

## 2021-06-02 DIAGNOSIS — F419 Anxiety disorder, unspecified: Secondary | ICD-10-CM

## 2021-06-02 DIAGNOSIS — F410 Panic disorder [episodic paroxysmal anxiety] without agoraphobia: Secondary | ICD-10-CM

## 2021-06-03 MED ORDER — ALPRAZOLAM 0.5 MG PO TABS: ORAL_TABLET | ORAL | 1 refills | Status: DC

## 2021-06-08 ENCOUNTER — Ambulatory Visit: Payer: BC Managed Care – PPO | Admitting: Rehabilitative and Restorative Service Providers"

## 2021-06-13 ENCOUNTER — Other Ambulatory Visit: Payer: Self-pay

## 2021-06-13 ENCOUNTER — Encounter: Payer: Self-pay | Admitting: Rehabilitative and Restorative Service Providers"

## 2021-06-13 ENCOUNTER — Ambulatory Visit: Payer: BC Managed Care – PPO | Attending: Plastic Surgery | Admitting: Rehabilitative and Restorative Service Providers"

## 2021-06-13 DIAGNOSIS — G8929 Other chronic pain: Secondary | ICD-10-CM | POA: Diagnosis not present

## 2021-06-13 DIAGNOSIS — M6281 Muscle weakness (generalized): Secondary | ICD-10-CM | POA: Diagnosis not present

## 2021-06-13 DIAGNOSIS — N62 Hypertrophy of breast: Secondary | ICD-10-CM | POA: Insufficient documentation

## 2021-06-13 DIAGNOSIS — M5136 Other intervertebral disc degeneration, lumbar region: Secondary | ICD-10-CM | POA: Insufficient documentation

## 2021-06-13 DIAGNOSIS — R29898 Other symptoms and signs involving the musculoskeletal system: Secondary | ICD-10-CM

## 2021-06-13 DIAGNOSIS — M546 Pain in thoracic spine: Secondary | ICD-10-CM | POA: Diagnosis not present

## 2021-06-13 DIAGNOSIS — R293 Abnormal posture: Secondary | ICD-10-CM

## 2021-06-13 DIAGNOSIS — M549 Dorsalgia, unspecified: Secondary | ICD-10-CM | POA: Insufficient documentation

## 2021-06-13 NOTE — Patient Instructions (Signed)
Access Code: [email or web address] URL: [email or web address] Date: 06/13/2021 Prepared by: Corlis Leak  Exercises Seated Cervical Retraction - 3 x daily - 7 x weekly - 1 sets - 10 reps Standing Scapular Retraction - 3 x daily - 7 x weekly - 1 sets - 10 reps - 10 hold Shoulder External Rotation and Scapular Retraction - 3 x daily - 7 x weekly - 1 sets - 10 reps - hold Shoulder External Rotation in 45 Degrees Abduction - 2 x daily - 7 x weekly - 1-2 sets - 10 reps - 3 sec hold Doorway Pec Stretch at 60 Degrees Abduction - 3 x daily - 7 x weekly - 1 sets - 3 reps Doorway Pec Stretch at 90 Degrees Abduction - 3 x daily - 7 x weekly - 1 sets - 3 reps - 30 seconds hold Doorway Pec Stretch at 120 Degrees Abduction - 3 x daily - 7 x weekly - 1 sets - 3 reps - 30 second hold hold

## 2021-06-13 NOTE — Therapy (Signed)
[place], [place], [address] Phone: [phone]   Fax:  [phone]  Physical Therapy Evaluation  Patient Details  Name: Sydney Bailey MRN: [id number] Date of Birth: 10-26-1965 Referring Provider (PT): Dr Audelia Hives   Encounter Date: 06/13/2021   PT End of Session - 06/13/21 1555     Visit Number 1    Number of Visits 12    Date for PT Re-Evaluation 08/01/21    PT Start Time [id number]    PT Stop Time 1555    PT Time Calculation (min) 40 min    Activity Tolerance Patient tolerated treatment well             Past Medical History:  Diagnosis Date   Allergy    Anxiety    Depression    Family history of anesthesia complication    Father had difficult time waking up    Heart palpitations    PVC's   Hypothyroidism 11-08   started post- partum   Migraines    PONV (postoperative nausea and vomiting)    Difficult waking up; had bad migraine after bunionectomy   PVC's (premature ventricular contractions)    Pt saw Dr. Stanford Breed for this but pt stated it was fine    Past Surgical History:  Procedure Laterality Date   BUNIONECTOMY Bilateral    x 2 on both feet   CHOLECYSTECTOMY N/A 05/22/2013   Procedure: LAPAROSCOPIC CHOLECYSTECTOMY;  Surgeon: Harl Bowie, MD;  Location: [facility];  Service: General;  Laterality: N/A;   essure BTL     UPPER GI ENDOSCOPY      There were no vitals filed for this visit.    Subjective Assessment - 06/13/21 1523     Subjective Patient reports that hse has constant pain in the midback which has gotten progressively worse in the past year with gradual increase in the past 15 years.    Pertinent History PVC's followed by cardiologist; kidney stone 2022; lumbar DDD    Patient Stated Goals learn exercises to try to upper back between shoulder blades    Currently in Pain? Yes    Pain Score 3     Pain Location Shoulder    Pain Orientation Right;Left     Pain Descriptors / Indicators Aching;Throbbing;Nagging    Pain Type Chronic pain    Pain Radiating Towards upper back all the way across    Pain Onset More than a month ago    Pain Frequency Constant    Aggravating Factors  using arms; driving; typing at work    Pain Relieving Factors heat - temporary help                [facility] PT Assessment - 06/13/21 0001       Assessment   Medical Diagnosis Upper back pain; large breasts    Referring Provider (PT) Dr Lyndee Leo Dillingham    Onset Date/Surgical Date 06/05/21   pain for 15 years   Hand Dominance Right    Next MD Visit after PT    Prior Therapy here for LBP      Precautions   Precautions None      Balance Screen   Has the patient fallen in the past 6 months No    Has the patient had a decrease in activity level because of a fear of falling?  No    Is the patient reluctant to leave their home because of a fear of falling?  No      Home Ecologist residence    Living Arrangements Spouse/significant other      Prior Function   Level of Independence Independent    Vocation Part time employment    Vocation Requirements desk/computer 3 days/wk    Leisure household chores      Sensation   Additional Comments WFL's      Posture/Postural Control   Posture Comments head forward; shoulders rounded and elevated; increased thoracic kyphosis; scapulae abducted and rotated along the thoracic wall      AROM   Cervical Flexion 50 pulling pain    Cervical Extension 56 pulling pain    Cervical - Right Side Bend 33 pulling pain    Cervical - Left Side Bend 39 pulling pain    Cervical - Right Rotation 68 pulling pain    Cervical - Left Rotation 63 pulling pain      Strength   Right Shoulder Flexion 5/5    Right Shoulder Extension 5/5    Right Shoulder ABduction 4+/5    Right Shoulder Horizontal ABduction 4+/5    Left Shoulder Flexion 5/5    Left Shoulder Extension 5/5    Left Shoulder ABduction  4+/5    Left Shoulder Horizontal ABduction --   4+/5     Palpation   Spinal mobility hypomobility cervical and upper thoracic spine with PA and lateral mobs    Palpation comment significant muscular tightness through the ant/lat/posterior cervical muaculature; pecs; upper trap; leveator bilat                        Objective measurements completed on examination: See above findings.       [facility] Adult PT Treatment/Exercise - 06/13/21 0001       Therapeutic Activites    Other Therapeutic Activities myofacial ball release work standing      Neuro Re-ed    Neuro Re-ed Details  postural education re- posterior shoulder girdle activation      Shoulder Exercises: Standing   Other Standing Exercises axial extension 10 sec x 5 reps; scap squeeze 10 sec x 5 reps; L's x 10; W's x 10 with foam roll along spine      Shoulder Exercises: Stretch   Other Shoulder Stretches doorway stretch 3 positions x 30 sec hold x 3 reps each position                     PT Education - 06/13/21 1550     Education Details HEP POC myofacial ball release work; Herbalist) Educated Patient    Methods Explanation;Demonstration;Tactile cues;Verbal cues;Handout    Comprehension Verbalized understanding;Returned demonstration;Verbal cues required;Tactile cues required                 PT Long Term Goals - 06/13/21 1600       PT LONG TERM GOAL #1   Title Decrease pain in upper back byt 50-75% allowing patient to perform normal functional activities    Baseline -    Time 6    Period Weeks    Status New    Target Date 08/01/21      PT LONG TERM GOAL #2   Title Improve posture and alignment with improved posterior shoulder girdle activation to improve upright posture    Time 6    Period Weeks    Status New    Target Date 08/01/21  PT LONG TERM GOAL #3   Title Full pain free cervical ROM to improve functional activity level    Baseline -    Time  6    Period Weeks    Status New    Target Date 08/01/21      PT LONG TERM GOAL #4   Title Independent in HEP    Time 6    Period Weeks    Status New    Target Date 08/01/21      PT LONG TERM GOAL #5   Title Patient to verbalize understanding of importance of continuing with consistent HEP after d/c from PT    Time 6    Period Weeks    Status New    Target Date 08/01/21                    Plan - 06/13/21 1556     Clinical Impression Statement Patient presents with significant increase in upper back and shoulder pain in the past year with history of pain for > 15 years. Patient has poor posture and alignment; limited cervical and thoracic mobility/ROM; significant muscular tightness to palpation through upper body; pain with functional and work activities. Patient will benefit from PT to address problems identified.    Stability/Clinical Decision Making Stable/Uncomplicated    Clinical Decision Making Low    Rehab Potential Good    PT Frequency 2x / week    PT Duration 6 weeks    PT Treatment/Interventions ADLs/Self Care Home Management;Aquatic Therapy;Cryotherapy;Electrical Stimulation;Iontophoresis 4mg /ml Dexamethasone;Moist Heat;Ultrasound;Therapeutic activities;Therapeutic exercise;Neuromuscular re-education;Patient/family education;Manual techniques;Dry needling;Taping    PT Next Visit Plan review HEP; progress with stretching (add prolonged snow angel); progress with posterior shoulder girdle strengthening    PT Home Exercise Plan [email or web address]    Consulted and Agree with Plan of Care Patient             Patient will benefit from skilled therapeutic intervention in order to improve the following deficits and impairments:  Decreased range of motion, Decreased activity tolerance, Pain, Impaired UE functional use, Hypomobility, Impaired flexibility, Improper body mechanics, Decreased mobility, Decreased strength, Postural dysfunction  Visit Diagnosis: Chronic  bilateral thoracic back pain  Abnormal posture  Other symptoms and signs involving the musculoskeletal system  Muscle weakness (generalized)     Problem List Patient Active Problem List   Diagnosis Date Noted   Hematuria 05/04/2021   Left flank pain 05/04/2021   Nausea and vomiting 05/04/2021   History of kidney stones 05/04/2021   Hot flashes 05/03/2021   Large breasts 05/03/2021   Upper back pain 05/03/2021   Kidney stone on left side 04/27/2021   DDD (degenerative disc disease), lumbar 10/13/2020   Facet arthritis of lumbar region 10/13/2020   Right hip pain 10/11/2020   Chronic right-sided low back pain with right-sided sciatica 10/11/2020   Recurrent major depressive disorder, in full remission ([place]) 07/20/2020   Persistent shortness of breath after COVID-19 07/01/2020   Hyperlipidemia LDL goal <160 05/07/2018   No energy 10/14/2017   Vertigo 10/14/2017   Menopause 03/27/2017   ETD (Eustachian tube dysfunction), right 03/27/2017   Low serum progesterone 06/09/2016   Ventricular quadrigeminy 04/21/2016   PVC's (premature ventricular contractions) 04/21/2016   Depression 08/11/2014   Palpitations 03/19/2013   Dyspnea 03/19/2013   PANIC ATTACK 10/02/2008   MENORRHAGIA 07/27/2008   Hypothyroidism 11/22/2007   Migraine without aura 11/22/2007    Raeanne Deschler Nilda Simmer, PT, MPH  06/13/2021, 4:52 PM  [facility] [place], [place], [address] Phone: [phone]   Fax:  [phone]  Name: Sydney Bailey MRN: [id number] Date of Birth: 09-15-1965

## 2021-06-17 ENCOUNTER — Encounter: Payer: Self-pay | Admitting: Physician Assistant

## 2021-06-17 DIAGNOSIS — Z79899 Other long term (current) drug therapy: Secondary | ICD-10-CM

## 2021-06-17 DIAGNOSIS — Z780 Asymptomatic menopausal state: Secondary | ICD-10-CM

## 2021-06-17 DIAGNOSIS — E039 Hypothyroidism, unspecified: Secondary | ICD-10-CM

## 2021-06-17 DIAGNOSIS — R7989 Other specified abnormal findings of blood chemistry: Secondary | ICD-10-CM

## 2021-06-20 ENCOUNTER — Ambulatory Visit: Payer: BC Managed Care – PPO | Admitting: Rehabilitative and Restorative Service Providers"

## 2021-06-20 ENCOUNTER — Other Ambulatory Visit: Payer: Self-pay

## 2021-06-20 ENCOUNTER — Encounter: Payer: Self-pay | Admitting: Rehabilitative and Restorative Service Providers"

## 2021-06-20 DIAGNOSIS — M5136 Other intervertebral disc degeneration, lumbar region: Secondary | ICD-10-CM | POA: Diagnosis not present

## 2021-06-20 DIAGNOSIS — M6281 Muscle weakness (generalized): Secondary | ICD-10-CM

## 2021-06-20 DIAGNOSIS — E039 Hypothyroidism, unspecified: Secondary | ICD-10-CM | POA: Diagnosis not present

## 2021-06-20 DIAGNOSIS — G8929 Other chronic pain: Secondary | ICD-10-CM | POA: Diagnosis not present

## 2021-06-20 DIAGNOSIS — M546 Pain in thoracic spine: Secondary | ICD-10-CM | POA: Diagnosis not present

## 2021-06-20 DIAGNOSIS — R293 Abnormal posture: Secondary | ICD-10-CM

## 2021-06-20 DIAGNOSIS — M549 Dorsalgia, unspecified: Secondary | ICD-10-CM | POA: Diagnosis not present

## 2021-06-20 DIAGNOSIS — N62 Hypertrophy of breast: Secondary | ICD-10-CM | POA: Diagnosis not present

## 2021-06-20 DIAGNOSIS — Z780 Asymptomatic menopausal state: Secondary | ICD-10-CM | POA: Diagnosis not present

## 2021-06-20 DIAGNOSIS — R29898 Other symptoms and signs involving the musculoskeletal system: Secondary | ICD-10-CM | POA: Diagnosis not present

## 2021-06-20 DIAGNOSIS — R7989 Other specified abnormal findings of blood chemistry: Secondary | ICD-10-CM | POA: Diagnosis not present

## 2021-06-20 DIAGNOSIS — Z79899 Other long term (current) drug therapy: Secondary | ICD-10-CM | POA: Diagnosis not present

## 2021-06-20 NOTE — Patient Instructions (Signed)
Access Code: [email or web address] URL: [email or web address] Date: 06/20/2021 Prepared by: Corlis Leak  Exercises Seated Cervical Retraction - 3 x daily - 7 x weekly - 1 sets - 10 reps Standing Scapular Retraction - 3 x daily - 7 x weekly - 1 sets - 10 reps - 10 hold Shoulder External Rotation and Scapular Retraction - 3 x daily - 7 x weekly - 1 sets - 10 reps - hold Shoulder External Rotation in 45 Degrees Abduction - 2 x daily - 7 x weekly - 1-2 sets - 10 reps - 3 sec hold Doorway Pec Stretch at 60 Degrees Abduction - 3 x daily - 7 x weekly - 1 sets - 3 reps Doorway Pec Stretch at 90 Degrees Abduction - 3 x daily - 7 x weekly - 1 sets - 3 reps - 30 seconds hold Doorway Pec Stretch at 120 Degrees Abduction - 3 x daily - 7 x weekly - 1 sets - 3 reps - 30 second hold hold Standing Bilateral Low Shoulder Row with Anchored Resistance - 2 x daily - 7 x weekly - 1-3 sets - 10 reps - 2-3 sec hold Drawing Bow - 1 x daily - 7 x weekly - 1 sets - 10 reps - 3 sec hold Standing Shoulder External Rotation with Resistance - 2 x daily - 7 x weekly - 1-3 sets - 10 reps - 2-3 sec hold

## 2021-06-20 NOTE — Therapy (Signed)
[place], [place], [address] Phone: [phone]   Fax:  [phone]  Physical Therapy Treatment  Patient Details  Name: Sydney Bailey MRN: [id number] Date of Birth: 1966-02-09 Referring Provider (PT): Dr Audelia Hives   Encounter Date: 06/20/2021   PT End of Session - 06/20/21 1438     Visit Number 2    Number of Visits 12    Date for PT Re-Evaluation 08/01/21    PT Start Time [id number]    PT Stop Time 1512    PT Time Calculation (min) 38 min    Activity Tolerance Patient tolerated treatment well             Past Medical History:  Diagnosis Date   Allergy    Anxiety    Depression    Family history of anesthesia complication    Father had difficult time waking up    Heart palpitations    PVC's   Hypothyroidism 11-08   started post- partum   Migraines    PONV (postoperative nausea and vomiting)    Difficult waking up; had bad migraine after bunionectomy   PVC's (premature ventricular contractions)    Pt saw Dr. Stanford Breed for this but pt stated it was fine    Past Surgical History:  Procedure Laterality Date   BUNIONECTOMY Bilateral    x 2 on both feet   CHOLECYSTECTOMY N/A 05/22/2013   Procedure: LAPAROSCOPIC CHOLECYSTECTOMY;  Surgeon: Harl Bowie, MD;  Location: [facility];  Service: General;  Laterality: N/A;   essure BTL     UPPER GI ENDOSCOPY      There were no vitals filed for this visit.   Subjective Assessment - 06/20/21 1439     Subjective Did some exercises over the weekend but had a virus and didn't feel well.    Currently in Pain? Yes    Pain Score 3     Pain Location Shoulder    Pain Orientation Right;Left    Pain Descriptors / Indicators Aching;Throbbing;Nagging    Pain Type Chronic pain    Pain Onset More than a month ago    Pain Frequency Constant                               [facility] Adult PT Treatment/Exercise - 06/20/21 0001        Therapeutic Activites    Other Therapeutic Activities myofacial ball release work standing      Neck Exercises: Seated   Neck Retraction 5 reps;5 secs    Cervical Rotation Right;Left;5 reps    Lateral Flexion Right;Left;5 reps    W Back 10 reps    Money 10 reps    Shoulder Rolls Backwards;10 reps      Shoulder Exercises: Standing   Row Strengthening;Both;10 reps;Theraband    Theraband Level (Shoulder Row) Level 3 (Green)    Row Limitations bow and arrow x 10 each side green TB    Retraction Strengthening;Both;10 reps;Theraband    Theraband Level (Shoulder Retraction) Level 1 (Yellow)    Retraction Limitations posterior shoulder rolls x 10    Other Standing Exercises axial extension 10 sec x 5 reps; scap squeeze 10 sec x 5 reps; L's x 10; W's x 10 with foam roll along spine      Shoulder Exercises: Stretch   Other Shoulder Stretches doorway stretch 3 positions x 30 sec hold x 3 reps each  position    Other Shoulder Stretches prolonged snow angel on green foam roll ~ 2 min                     PT Education - 06/20/21 1454     Education Details HEP    Person(s) Educated Patient    Methods Explanation;Demonstration;Tactile cues;Verbal cues;Handout    Comprehension Verbalized understanding;Returned demonstration;Verbal cues required;Tactile cues required                 PT Long Term Goals - 06/13/21 1600       PT LONG TERM GOAL #1   Title Decrease pain in upper back byt 50-75% allowing patient to perform normal functional activities    Baseline -    Time 6    Period Weeks    Status New    Target Date 08/01/21      PT LONG TERM GOAL #2   Title Improve posture and alignment with improved posterior shoulder girdle activation to improve upright posture    Time 6    Period Weeks    Status New    Target Date 08/01/21      PT LONG TERM GOAL #3   Title Full pain free cervical ROM to improve functional activity level    Baseline -    Time 6    Period  Weeks    Status New    Target Date 08/01/21      PT LONG TERM GOAL #4   Title Independent in HEP    Time 6    Period Weeks    Status New    Target Date 08/01/21      PT LONG TERM GOAL #5   Title Patient to verbalize understanding of importance of continuing with consistent HEP after d/c from PT    Time 6    Period Weeks    Status New    Target Date 08/01/21                   Plan - 06/20/21 1441     Clinical Impression Statement Patient with a virus over the weekend and unable to do the exercises as consistently as she would like and was instructed. Continued with anterior chest stretching and posterior shoulder girdle strengthening. Reviewed all exercises and added exercises with theraband. All exercises tolerated well.    Rehab Potential Good    PT Frequency 2x / week    PT Duration 6 weeks    PT Treatment/Interventions ADLs/Self Care Home Management;Aquatic Therapy;Cryotherapy;Electrical Stimulation;Iontophoresis 4mg /ml Dexamethasone;Moist Heat;Ultrasound;Therapeutic activities;Therapeutic exercise;Neuromuscular re-education;Patient/family education;Manual techniques;Dry needling;Taping    PT Next Visit Plan review HEP; progress with stretching (add prolonged snow angel); progress with posterior shoulder girdle strengthening    PT Home Exercise Plan [email or web address]    Consulted and Agree with Plan of Care Patient             Patient will benefit from skilled therapeutic intervention in order to improve the following deficits and impairments:     Visit Diagnosis: Chronic bilateral thoracic back pain  Abnormal posture  Other symptoms and signs involving the musculoskeletal system  Muscle weakness (generalized)     Problem List Patient Active Problem List   Diagnosis Date Noted   Hematuria 05/04/2021   Left flank pain 05/04/2021   Nausea and vomiting 05/04/2021   History of kidney stones 05/04/2021   Hot flashes 05/03/2021   Large breasts 05/03/2021    Upper back pain 05/03/2021  Kidney stone on left side 04/27/2021   DDD (degenerative disc disease), lumbar 10/13/2020   Facet arthritis of lumbar region 10/13/2020   Right hip pain 10/11/2020   Chronic right-sided low back pain with right-sided sciatica 10/11/2020   Recurrent major depressive disorder, in full remission ([place]) 07/20/2020   Persistent shortness of breath after COVID-19 07/01/2020   Hyperlipidemia LDL goal <160 05/07/2018   No energy 10/14/2017   Vertigo 10/14/2017   Menopause 03/27/2017   ETD (Eustachian tube dysfunction), right 03/27/2017   Low serum progesterone 06/09/2016   Ventricular quadrigeminy 04/21/2016   PVC's (premature ventricular contractions) 04/21/2016   Depression 08/11/2014   Palpitations 03/19/2013   Dyspnea 03/19/2013   PANIC ATTACK 10/02/2008   MENORRHAGIA 07/27/2008   Hypothyroidism 11/22/2007   Migraine without aura 11/22/2007    Lometa Riggin Nilda Simmer, PT, MPH 06/20/2021, 3:15 PM  [facility] [address] [place], [place], [address] Phone: [phone]   Fax:  [phone]  Name: Sydney Bailey MRN: [id number] Date of Birth: 1965-12-25

## 2021-06-21 LAB — THYROID PANEL WITH TSH
Free Thyroxine Index: 3 (ref 1.4–3.8)
T3 Uptake: 34 % (ref 22–35)
T4, Total: 8.9 ug/dL (ref 5.1–11.9)
TSH: <0.01 mIU/L — ABNORMAL LOW

## 2021-06-21 LAB — ESTRADIOL: Estradiol: 38 pg/mL

## 2021-06-21 LAB — PROGESTERONE: Progesterone: 5.7 ng/mL

## 2021-06-21 NOTE — Progress Notes (Signed)
Please call [facility] pharmacist and give him the updated labs so that we can adjust medications. Her TSH is still suppressed.

## 2021-06-22 ENCOUNTER — Encounter: Payer: Self-pay | Admitting: Rehabilitative and Restorative Service Providers"

## 2021-06-22 ENCOUNTER — Ambulatory Visit: Payer: BC Managed Care – PPO | Admitting: Rehabilitative and Restorative Service Providers"

## 2021-06-22 ENCOUNTER — Other Ambulatory Visit: Payer: Self-pay

## 2021-06-22 DIAGNOSIS — N62 Hypertrophy of breast: Secondary | ICD-10-CM | POA: Diagnosis not present

## 2021-06-22 DIAGNOSIS — M5136 Other intervertebral disc degeneration, lumbar region: Secondary | ICD-10-CM | POA: Diagnosis not present

## 2021-06-22 DIAGNOSIS — M6281 Muscle weakness (generalized): Secondary | ICD-10-CM

## 2021-06-22 DIAGNOSIS — M545 Low back pain, unspecified: Secondary | ICD-10-CM

## 2021-06-22 DIAGNOSIS — M546 Pain in thoracic spine: Secondary | ICD-10-CM | POA: Diagnosis not present

## 2021-06-22 DIAGNOSIS — G8929 Other chronic pain: Secondary | ICD-10-CM | POA: Diagnosis not present

## 2021-06-22 DIAGNOSIS — R29898 Other symptoms and signs involving the musculoskeletal system: Secondary | ICD-10-CM

## 2021-06-22 DIAGNOSIS — R293 Abnormal posture: Secondary | ICD-10-CM

## 2021-06-22 DIAGNOSIS — M549 Dorsalgia, unspecified: Secondary | ICD-10-CM | POA: Diagnosis not present

## 2021-06-22 MED ORDER — AMBULATORY NON FORMULARY MEDICATION: 1 refills | Status: DC

## 2021-06-22 NOTE — Therapy (Signed)
[place], [place], [address] Phone: [phone]   Fax:  [phone]  Physical Therapy Treatment  Patient Details  Name: Sydney Bailey MRN: [id number] Date of Birth: [birth year] Referring Provider (PT): Dr Audelia Hives   Encounter Date: 06/22/2021   PT End of Session - 06/22/21 0845     Visit Number 3    Number of Visits 12    Date for PT Re-Evaluation 08/01/21    PT Start Time 0844    PT Stop Time 0924    PT Time Calculation (min) 40 min    Activity Tolerance Patient tolerated treatment well             Past Medical History:  Diagnosis Date   Allergy    Anxiety    Depression    Family history of anesthesia complication    Father had difficult time waking up    Heart palpitations    PVC's   Hypothyroidism 11-08   started post- partum   Migraines    PONV (postoperative nausea and vomiting)    Difficult waking up; had bad migraine after bunionectomy   PVC's (premature ventricular contractions)    Pt saw Dr. Stanford Breed for this but pt stated it was fine    Past Surgical History:  Procedure Laterality Date   BUNIONECTOMY Bilateral    x 2 on both feet   CHOLECYSTECTOMY N/A 05/22/2013   Procedure: LAPAROSCOPIC CHOLECYSTECTOMY;  Surgeon: Harl Bowie, MD;  Location: [facility];  Service: General;  Laterality: N/A;   essure BTL     UPPER GI ENDOSCOPY      There were no vitals filed for this visit.   Subjective Assessment - 06/22/21 0845     Subjective Supper busy at work yesterday. Did not have a chance to do much with her HEP.    Currently in Pain? Yes    Pain Score 2     Pain Location Thoracic    Pain Orientation Right;Left    Pain Descriptors / Indicators Sore    Pain Type Chronic pain                [facility] PT Assessment - 06/22/21 0001       Assessment   Medical Diagnosis Upper back pain; large breasts    Referring Provider (PT) Dr Lyndee Leo Dillingham     Onset Date/Surgical Date 06/05/21   pain for 15 years   Hand Dominance Right    Next MD Visit after PT    Prior Therapy here for LBP      AROM   Cervical Flexion 47    Cervical Extension 59    Cervical - Right Side Bend 26    Cervical - Left Side Bend 27    Cervical - Right Rotation 71    Cervical - Left Rotation 70                           [facility] Adult PT Treatment/Exercise - 06/22/21 0001       Self-Care   Other Self-Care Comments  posture and cervical stretch for desk and driving      Therapeutic Activites    Other Therapeutic Activities myofacial ball release work standing      Neuro Re-ed    Neuro Re-ed Details  postural education re- posterior shoulder girdle activation      Neck Exercises: Seated   Neck Retraction 5 reps;5  secs    Cervical Rotation Right;Left;5 reps    Lateral Flexion Right;Left;5 reps    W Back 10 reps    Money 10 reps    Shoulder Rolls Backwards;10 reps      Shoulder Exercises: Standing   Row Strengthening;Both;10 reps;Theraband    Theraband Level (Shoulder Row) Level 3 (Green)    Row Limitations bow and arrow x 10 each side green TB    Retraction Strengthening;Both;10 reps;Theraband    Theraband Level (Shoulder Retraction) Level 1 (Yellow)    Retraction Limitations posterior shoulder rolls x 10    Other Standing Exercises axial extension 10 sec x 5 reps; scap squeeze 10 sec x 5 reps; L's x 10; W's x 10 with foam roll along spine      Shoulder Exercises: ROM/Strengthening   UBE (Upper Arm Bike) L4 x 4 miin 2 min fwd/2 min back      Shoulder Exercises: Stretch   Other Shoulder Stretches doorway stretch 3 positions x 30 sec hold x 3 reps each position    Other Shoulder Stretches prolonged snow angel on green foam roll ~ 2 min                          PT Long Term Goals - 06/13/21 1600       PT LONG TERM GOAL #1   Title Decrease pain in upper back byt 50-75% allowing patient to perform normal functional  activities    Baseline -    Time 6    Period Weeks    Status New    Target Date 08/01/21      PT LONG TERM GOAL #2   Title Improve posture and alignment with improved posterior shoulder girdle activation to improve upright posture    Time 6    Period Weeks    Status New    Target Date 08/01/21      PT LONG TERM GOAL #3   Title Full pain free cervical ROM to improve functional activity level    Baseline -    Time 6    Period Weeks    Status New    Target Date 08/01/21      PT LONG TERM GOAL #4   Title Independent in HEP    Time 6    Period Weeks    Status New    Target Date 08/01/21      PT LONG TERM GOAL #5   Title Patient to verbalize understanding of importance of continuing with consistent HEP after d/c from PT    Time 6    Period Weeks    Status New    Target Date 08/01/21                   Plan - 06/22/21 0848     Clinical Impression Statement Patient continues to be tired from whatever wasa going on over the weekend and a very busy. long day at work yesterday. She has no had a chance to do much with her exercises. No new exercises added today. Will wait for patient to become comfortable with current exercises.    Rehab Potential Good    PT Frequency 2x / week    PT Duration 6 weeks    PT Treatment/Interventions ADLs/Self Care Home Management;Aquatic Therapy;Cryotherapy;Electrical Stimulation;Iontophoresis 4mg /ml Dexamethasone;Moist Heat;Ultrasound;Therapeutic activities;Therapeutic exercise;Neuromuscular re-education;Patient/family education;Manual techniques;Dry needling;Taping    PT Next Visit Plan review HEP; progress with stretching (add prolonged snow angel); progress  with posterior shoulder girdle strengthening    PT Home Exercise Plan [id number]    Consulted and Agree with Plan of Care Patient             Patient will benefit from skilled therapeutic intervention in order to improve the following deficits and impairments:     Visit  Diagnosis: Chronic bilateral thoracic back pain  Abnormal posture  Other symptoms and signs involving the musculoskeletal system  Muscle weakness (generalized)  Acute bilateral low back pain without sciatica  Chronic bilateral low back pain without sciatica     Problem List Patient Active Problem List   Diagnosis Date Noted   Hematuria 05/04/2021   Left flank pain 05/04/2021   Nausea and vomiting 05/04/2021   History of kidney stones 05/04/2021   Hot flashes 05/03/2021   Large breasts 05/03/2021   Upper back pain 05/03/2021   Kidney stone on left side 04/27/2021   DDD (degenerative disc disease), lumbar 10/13/2020   Facet arthritis of lumbar region 10/13/2020   Right hip pain 10/11/2020   Chronic right-sided low back pain with right-sided sciatica 10/11/2020   Recurrent major depressive disorder, in full remission ([place]) 07/20/2020   Persistent shortness of breath after COVID-19 07/01/2020   Hyperlipidemia LDL goal <160 05/07/2018   No energy 10/14/2017   Vertigo 10/14/2017   Menopause 03/27/2017   ETD (Eustachian tube dysfunction), right 03/27/2017   Low serum progesterone 06/09/2016   Ventricular quadrigeminy 04/21/2016   PVC's (premature ventricular contractions) 04/21/2016   Depression 08/11/2014   Palpitations 03/19/2013   Dyspnea 03/19/2013   PANIC ATTACK 10/02/2008   MENORRHAGIA 07/27/2008   Hypothyroidism 11/22/2007   Migraine without aura 11/22/2007    Dareen Gutzwiller Nilda Simmer, PT, MPH  06/22/2021, 9:21 AM  [facility] [place] [address] [place], [place], [address] Phone: [phone]   Fax:  [phone]  Name: KAHLYNN OLDAKER MRN: [id number] Date of Birth: [birth year]

## 2021-06-22 NOTE — Addendum Note (Signed)
Addended byAnnamaria Helling on: 06/22/2021 03:24 PM   Modules accepted: Orders

## 2021-06-27 ENCOUNTER — Ambulatory Visit: Payer: BC Managed Care – PPO | Admitting: Physical Therapy

## 2021-06-27 ENCOUNTER — Other Ambulatory Visit: Payer: Self-pay

## 2021-06-27 DIAGNOSIS — M6281 Muscle weakness (generalized): Secondary | ICD-10-CM | POA: Diagnosis not present

## 2021-06-27 DIAGNOSIS — M5136 Other intervertebral disc degeneration, lumbar region: Secondary | ICD-10-CM | POA: Diagnosis not present

## 2021-06-27 DIAGNOSIS — R293 Abnormal posture: Secondary | ICD-10-CM

## 2021-06-27 DIAGNOSIS — G8929 Other chronic pain: Secondary | ICD-10-CM

## 2021-06-27 DIAGNOSIS — M549 Dorsalgia, unspecified: Secondary | ICD-10-CM | POA: Diagnosis not present

## 2021-06-27 DIAGNOSIS — M546 Pain in thoracic spine: Secondary | ICD-10-CM | POA: Diagnosis not present

## 2021-06-27 DIAGNOSIS — R29898 Other symptoms and signs involving the musculoskeletal system: Secondary | ICD-10-CM

## 2021-06-27 DIAGNOSIS — N62 Hypertrophy of breast: Secondary | ICD-10-CM | POA: Diagnosis not present

## 2021-06-27 NOTE — Therapy (Signed)
[place], [place], [address] Phone: [phone]   Fax:  [phone]  Physical Therapy Treatment  Patient Details  Name: Sydney Bailey MRN: [id number] Date of Birth: 07/13/1965 Referring Provider (PT): Dr Audelia Hives   Encounter Date: 06/27/2021   PT End of Session - 06/27/21 1523     Visit Number 4    Number of Visits 12    Date for PT Re-Evaluation 08/01/21    Authorization Type BCBS    PT Start Time 1520    PT Stop Time 1600    PT Time Calculation (min) 40 min    Activity Tolerance Patient tolerated treatment well             Past Medical History:  Diagnosis Date   Allergy    Anxiety    Depression    Family history of anesthesia complication    Father had difficult time waking up    Heart palpitations    PVC's   Hypothyroidism 11-08   started post- partum   Migraines    PONV (postoperative nausea and vomiting)    Difficult waking up; had bad migraine after bunionectomy   PVC's (premature ventricular contractions)    Pt saw Dr. Stanford Breed for this but pt stated it was fine    Past Surgical History:  Procedure Laterality Date   BUNIONECTOMY Bilateral    x 2 on both feet   CHOLECYSTECTOMY N/A 05/22/2013   Procedure: LAPAROSCOPIC CHOLECYSTECTOMY;  Surgeon: Harl Bowie, MD;  Location: [facility];  Service: General;  Laterality: N/A;   essure BTL     UPPER GI ENDOSCOPY      There were no vitals filed for this visit.   Subjective Assessment - 06/27/21 1523     Subjective Pt reports feeling soreness. Pt reports still continued feelings of pain. Not sure what has increased it.    Pertinent History PVC's followed by cardiologist; kidney stone 2022; lumbar DDD    Patient Stated Goals learn exercises to try to upper back between shoulder blades    Currently in Pain? Yes    Pain Score 4     Pain Location Thoracic    Pain Orientation Right;Left    Pain Descriptors /  Indicators Sore    Pain Type Chronic pain                [facility] PT Assessment - 06/27/21 0001       Assessment   Medical Diagnosis Upper back pain; large breasts    Referring Provider (PT) Dr Lyndee Leo Dillingham    Onset Date/Surgical Date 06/05/21    Hand Dominance Right    Next MD Visit after PT    Prior Therapy here for LBP                           [facility] Adult PT Treatment/Exercise - 06/27/21 0001       Neck Exercises: Seated   Neck Retraction 5 reps;5 secs    Cervical Rotation Right;Left;5 reps    Lateral Flexion Right;Left;5 reps      Shoulder Exercises: Prone   Other Prone Exercises I's, Y's, T's 2x10 each      Shoulder Exercises: Standing   Horizontal ABduction Strengthening;Both;10 reps;Theraband    Theraband Level (Shoulder Horizontal ABduction) Level 3 (Green)    External Rotation Strengthening;Both;20 reps;Theraband    Theraband Level (Shoulder External Rotation) Level 3 (Green)  Row Strengthening;Both;Theraband;20 reps    Theraband Level (Shoulder Row) Level 3 (Green)    Row Limitations bow and arrow x 10 x3 sec each side green TB      Shoulder Exercises: ROM/Strengthening   UBE (Upper Arm Bike) L4 x 4 miin 2 min fwd/2 min back      Shoulder Exercises: Stretch   Other Shoulder Stretches doorway stretch 3 positions x 30 sec hold x 3 reps each position    Other Shoulder Stretches prolonged snow angel on green foam roll ~ 2 min                          PT Long Term Goals - 06/13/21 1600       PT LONG TERM GOAL #1   Title Decrease pain in upper back byt 50-75% allowing patient to perform normal functional activities    Baseline -    Time 6    Period Weeks    Status New    Target Date 08/01/21      PT LONG TERM GOAL #2   Title Improve posture and alignment with improved posterior shoulder girdle activation to improve upright posture    Time 6    Period Weeks    Status New    Target Date 08/01/21      PT LONG  TERM GOAL #3   Title Full pain free cervical ROM to improve functional activity level    Baseline -    Time 6    Period Weeks    Status New    Target Date 08/01/21      PT LONG TERM GOAL #4   Title Independent in HEP    Time 6    Period Weeks    Status New    Target Date 08/01/21      PT LONG TERM GOAL #5   Title Patient to verbalize understanding of importance of continuing with consistent HEP after d/c from PT    Time 6    Period Weeks    Status New    Target Date 08/01/21                   Plan - 06/27/21 1557     Clinical Impression Statement Pt reports continued soreness. Continued to work on posterior shoulder gridle strengthening and thoracic stetching/manual work. Pt with difficulty performing horizontal shoulder abduction with resistance.    Stability/Clinical Decision Making Stable/Uncomplicated    Rehab Potential Good    PT Frequency 2x / week    PT Duration 6 weeks    PT Treatment/Interventions ADLs/Self Care Home Management;Aquatic Therapy;Cryotherapy;Electrical Stimulation;Iontophoresis 4mg /ml Dexamethasone;Moist Heat;Ultrasound;Therapeutic activities;Therapeutic exercise;Neuromuscular re-education;Patient/family education;Manual techniques;Dry needling;Taping    PT Next Visit Plan review HEP; progress with stretching (add prolonged snow angel); progress with posterior shoulder girdle strengthening    PT Home Exercise Plan [id number]    Consulted and Agree with Plan of Care Patient             Patient will benefit from skilled therapeutic intervention in order to improve the following deficits and impairments:  Decreased range of motion, Decreased activity tolerance, Pain, Impaired UE functional use, Hypomobility, Impaired flexibility, Improper body mechanics, Decreased mobility, Decreased strength, Postural dysfunction  Visit Diagnosis: Chronic bilateral thoracic back pain  Abnormal posture  Other symptoms and signs involving the musculoskeletal  system  Muscle weakness (generalized)     Problem List Patient Active Problem List   Diagnosis Date Noted  Hematuria 05/04/2021   Left flank pain 05/04/2021   Nausea and vomiting 05/04/2021   History of kidney stones 05/04/2021   Hot flashes 05/03/2021   Large breasts 05/03/2021   Upper back pain 05/03/2021   Kidney stone on left side 04/27/2021   DDD (degenerative disc disease), lumbar 10/13/2020   Facet arthritis of lumbar region 10/13/2020   Right hip pain 10/11/2020   Chronic right-sided low back pain with right-sided sciatica 10/11/2020   Recurrent major depressive disorder, in full remission ([place]) 07/20/2020   Persistent shortness of breath after COVID-19 07/01/2020   Hyperlipidemia LDL goal <160 05/07/2018   No energy 10/14/2017   Vertigo 10/14/2017   Menopause 03/27/2017   ETD (Eustachian tube dysfunction), right 03/27/2017   Low serum progesterone 06/09/2016   Ventricular quadrigeminy 04/21/2016   PVC's (premature ventricular contractions) 04/21/2016   Depression 08/11/2014   Palpitations 03/19/2013   Dyspnea 03/19/2013   PANIC ATTACK 10/02/2008   MENORRHAGIA 07/27/2008   Hypothyroidism 11/22/2007   Migraine without aura 11/22/2007    [facility] April Ma L Mayuri Staples, [place], DPT 06/27/2021, 4:38 PM  [facility] [address] [place], [place], [address] Phone: [phone]   Fax:  [phone]  Name: Sydney Bailey MRN: [id number] Date of Birth: [birth year]

## 2021-06-29 ENCOUNTER — Ambulatory Visit: Payer: BC Managed Care – PPO | Admitting: Rehabilitative and Restorative Service Providers"

## 2021-07-04 ENCOUNTER — Encounter: Payer: Self-pay | Admitting: Physician Assistant

## 2021-07-06 ENCOUNTER — Encounter: Payer: BC Managed Care – PPO | Admitting: Rehabilitative and Restorative Service Providers"

## 2021-07-06 ENCOUNTER — Ambulatory Visit: Payer: BC Managed Care – PPO | Admitting: Rehabilitative and Restorative Service Providers"

## 2021-07-08 ENCOUNTER — Encounter: Payer: Self-pay | Admitting: Physician Assistant

## 2021-07-08 ENCOUNTER — Ambulatory Visit: Payer: BC Managed Care – PPO | Attending: Plastic Surgery | Admitting: Physical Therapy

## 2021-07-08 ENCOUNTER — Institutional Professional Consult (permissible substitution): Payer: BC Managed Care – PPO | Admitting: Plastic Surgery

## 2021-07-08 ENCOUNTER — Other Ambulatory Visit: Payer: Self-pay

## 2021-07-08 DIAGNOSIS — R293 Abnormal posture: Secondary | ICD-10-CM | POA: Diagnosis not present

## 2021-07-08 DIAGNOSIS — G8929 Other chronic pain: Secondary | ICD-10-CM | POA: Insufficient documentation

## 2021-07-08 DIAGNOSIS — M546 Pain in thoracic spine: Secondary | ICD-10-CM | POA: Diagnosis not present

## 2021-07-08 DIAGNOSIS — M6281 Muscle weakness (generalized): Secondary | ICD-10-CM | POA: Diagnosis not present

## 2021-07-08 DIAGNOSIS — R29898 Other symptoms and signs involving the musculoskeletal system: Secondary | ICD-10-CM | POA: Insufficient documentation

## 2021-07-08 MED ORDER — AMBULATORY NON FORMULARY MEDICATION: 1 refills | Status: DC

## 2021-07-08 NOTE — Telephone Encounter (Signed)
Prescription faxed to [facility] at [phone] with confirmation received.

## 2021-07-08 NOTE — Therapy (Signed)
[place], [place], [address] Phone: [phone]   Fax:  [phone]  Physical Therapy Treatment  Patient Details  Name: Sydney Bailey MRN: [id number] Date of Birth: January 21, 1966 Referring Provider (PT): Dr Audelia Hives   Encounter Date: 07/08/2021   PT End of Session - 07/08/21 1525     Visit Number 5    Number of Visits 12    Date for PT Re-Evaluation 08/01/21    Authorization Type BCBS    PT Start Time 1442    PT Stop Time 1520    PT Time Calculation (min) 38 min    Activity Tolerance Patient tolerated treatment well    Behavior During Therapy [facility] for tasks assessed/performed             Past Medical History:  Diagnosis Date   Allergy    Anxiety    Depression    Family history of anesthesia complication    Father had difficult time waking up    Heart palpitations    PVC's   Hypothyroidism 11-08   started post- partum   Migraines    PONV (postoperative nausea and vomiting)    Difficult waking up; had bad migraine after bunionectomy   PVC's (premature ventricular contractions)    Pt saw Dr. Stanford Breed for this but pt stated it was fine    Past Surgical History:  Procedure Laterality Date   BUNIONECTOMY Bilateral    x 2 on both feet   CHOLECYSTECTOMY N/A 05/22/2013   Procedure: LAPAROSCOPIC CHOLECYSTECTOMY;  Surgeon: Harl Bowie, MD;  Location: [facility];  Service: General;  Laterality: N/A;   essure BTL     UPPER GI ENDOSCOPY      There were no vitals filed for this visit.   Subjective Assessment - 07/08/21 1444     Subjective Pt states she did her exercises yesterday and now has a knot in her back which is hurting. Otherwise she feels better    Patient Stated Goals learn exercises to try to upper back between shoulder blades    Currently in Pain? Yes    Pain Score 6     Pain Location Thoracic    Pain Orientation Posterior    Pain Descriptors / Indicators Sore                 [facility] PT Assessment - 07/08/21 0001       Assessment   Medical Diagnosis Upper back pain; large breasts    Referring Provider (PT) Dr Lyndee Leo Dillingham    Onset Date/Surgical Date 06/05/21    Hand Dominance Right    Next MD Visit after PT    Prior Therapy here for LBP                           [facility] Adult PT Treatment/Exercise - 07/08/21 0001       Shoulder Exercises: Prone   Other Prone Exercises I's, Y's, T's 2x10 each    Other Prone Exercises on physioball      Shoulder Exercises: Standing   Horizontal ABduction Strengthening;Both;Theraband;20 reps    Theraband Level (Shoulder Horizontal ABduction) Level 2 (Red)    Horizontal ABduction Limitations not able to tolerate green today    External Rotation Strengthening;Both;20 reps;Theraband    Theraband Level (Shoulder External Rotation) Level 2 (Red)    Row Strengthening;Both;Theraband;20 reps    Theraband Level (Shoulder Row) Level 3 (Green)  Row Limitations bow and arrow x 10 x3 sec each side green TB      Shoulder Exercises: ROM/Strengthening   UBE (Upper Arm Bike) L4 x 4 min alt fwd/bkwd      Shoulder Exercises: Stretch   Other Shoulder Stretches open book x 10 for LT side    Other Shoulder Stretches prolonged snow angel on green foam roll ~ 2 min      Manual Therapy   Manual Therapy Soft tissue mobilization    Soft tissue mobilization STM and trigger point release for thoracic paraspinals                          PT Long Term Goals - 06/13/21 1600       PT LONG TERM GOAL #1   Title Decrease pain in upper back byt 50-75% allowing patient to perform normal functional activities    Baseline -    Time 6    Period Weeks    Status New    Target Date 08/01/21      PT LONG TERM GOAL #2   Title Improve posture and alignment with improved posterior shoulder girdle activation to improve upright posture    Time 6    Period Weeks    Status New    Target Date  08/01/21      PT LONG TERM GOAL #3   Title Full pain free cervical ROM to improve functional activity level    Baseline -    Time 6    Period Weeks    Status New    Target Date 08/01/21      PT LONG TERM GOAL #4   Title Independent in HEP    Time 6    Period Weeks    Status New    Target Date 08/01/21      PT LONG TERM GOAL #5   Title Patient to verbalize understanding of importance of continuing with consistent HEP after d/c from PT    Time 6    Period Weeks    Status New    Target Date 08/01/21                   Plan - 07/08/21 1525     Clinical Impression Statement Pt with trigger points in thoracic paraspinals today. Eased with manual work and laying on foam roll. Added open book for thoracic stretchand mobility    Stability/Clinical Decision Making Stable/Uncomplicated    PT Next Visit Plan progress HEP (add open book), d/c    PT Home Exercise Plan [id number]    Consulted and Agree with Plan of Care Patient             Patient will benefit from skilled therapeutic intervention in order to improve the following deficits and impairments:  Decreased range of motion, Decreased activity tolerance, Pain, Impaired UE functional use, Hypomobility, Impaired flexibility, Improper body mechanics, Decreased mobility, Decreased strength, Postural dysfunction  Visit Diagnosis: Chronic bilateral thoracic back pain  Abnormal posture  Other symptoms and signs involving the musculoskeletal system     Problem List Patient Active Problem List   Diagnosis Date Noted   Hematuria 05/04/2021   Left flank pain 05/04/2021   Nausea and vomiting 05/04/2021   History of kidney stones 05/04/2021   Hot flashes 05/03/2021   Large breasts 05/03/2021   Upper back pain 05/03/2021   Kidney stone on left side 04/27/2021   DDD (degenerative disc disease),  lumbar 10/13/2020   Facet arthritis of lumbar region 10/13/2020   Right hip pain 10/11/2020   Chronic right-sided low back  pain with right-sided sciatica 10/11/2020   Recurrent major depressive disorder, in full remission ([place]) 07/20/2020   Persistent shortness of breath after COVID-19 07/01/2020   Hyperlipidemia LDL goal <160 05/07/2018   No energy 10/14/2017   Vertigo 10/14/2017   Menopause 03/27/2017   ETD (Eustachian tube dysfunction), right 03/27/2017   Low serum progesterone 06/09/2016   Ventricular quadrigeminy 04/21/2016   PVC's (premature ventricular contractions) 04/21/2016   Depression 08/11/2014   Palpitations 03/19/2013   Dyspnea 03/19/2013   PANIC ATTACK 10/02/2008   MENORRHAGIA 07/27/2008   Hypothyroidism 11/22/2007   Migraine without aura 11/22/2007    Miniya Miguez, PT 07/08/2021, 3:27 PM  [facility] [place], [place], [address] Phone: [phone]   Fax:  [phone]  Name: Sydney Bailey MRN: [id number] Date of Birth: 1965/09/13

## 2021-07-08 NOTE — Telephone Encounter (Signed)
I don't see Naltrexone on her med list, please advise if okay to write?

## 2021-07-09 ENCOUNTER — Other Ambulatory Visit: Payer: Self-pay

## 2021-07-09 ENCOUNTER — Encounter (HOSPITAL_BASED_OUTPATIENT_CLINIC_OR_DEPARTMENT_OTHER): Payer: Self-pay | Admitting: *Deleted

## 2021-07-09 ENCOUNTER — Emergency Department (HOSPITAL_BASED_OUTPATIENT_CLINIC_OR_DEPARTMENT_OTHER)
Admission: EM | Admit: 2021-07-09 | Discharge: 2021-07-09 | Disposition: A | Discharge status: Home / Self Care | Payer: BC Managed Care – PPO | Attending: Emergency Medicine | Admitting: Emergency Medicine

## 2021-07-09 ENCOUNTER — Emergency Department (HOSPITAL_BASED_OUTPATIENT_CLINIC_OR_DEPARTMENT_OTHER): Payer: BC Managed Care – PPO

## 2021-07-09 DIAGNOSIS — N200 Calculus of kidney: Secondary | ICD-10-CM | POA: Diagnosis not present

## 2021-07-09 DIAGNOSIS — Z79899 Other long term (current) drug therapy: Secondary | ICD-10-CM | POA: Insufficient documentation

## 2021-07-09 DIAGNOSIS — K5730 Diverticulosis of large intestine without perforation or abscess without bleeding: Secondary | ICD-10-CM | POA: Diagnosis not present

## 2021-07-09 DIAGNOSIS — N132 Hydronephrosis with renal and ureteral calculous obstruction: Secondary | ICD-10-CM | POA: Diagnosis not present

## 2021-07-09 DIAGNOSIS — Z9049 Acquired absence of other specified parts of digestive tract: Secondary | ICD-10-CM | POA: Diagnosis not present

## 2021-07-09 DIAGNOSIS — R109 Unspecified abdominal pain: Secondary | ICD-10-CM | POA: Diagnosis not present

## 2021-07-09 LAB — URINALYSIS, ROUTINE W REFLEX MICROSCOPIC
Bilirubin Urine: NEGATIVE
Glucose, UA: NEGATIVE mg/dL
Ketones, ur: NEGATIVE mg/dL
Nitrite: NEGATIVE
Protein, ur: NEGATIVE mg/dL
Specific Gravity, Urine: 1.02 (ref 1.005–1.030)
pH: 7 (ref 5.0–8.0)

## 2021-07-09 LAB — COMPREHENSIVE METABOLIC PANEL
ALT: 14 U/L (ref 0–44)
AST: 25 U/L (ref 15–41)
Albumin: 3.7 g/dL (ref 3.5–5.0)
Alkaline Phosphatase: 96 U/L (ref 38–126)
Anion gap: 10 (ref 5–15)
BUN: 10 mg/dL (ref 6–20)
CO2: 22 mmol/L (ref 22–32)
Calcium: 9.1 mg/dL (ref 8.9–10.3)
Chloride: 104 mmol/L (ref 98–111)
Creatinine, Ser: 0.54 mg/dL (ref 0.44–1.00)
GFR, Estimated: >60 mL/min (ref >60)
Glucose, Bld: 101 mg/dL — ABNORMAL HIGH (ref 70–99)
Potassium: 3.1 mmol/L — ABNORMAL LOW (ref 3.5–5.1)
Sodium: 136 mmol/L (ref 135–145)
Total Bilirubin: 0.3 mg/dL (ref 0.3–1.2)
Total Protein: 7.2 g/dL (ref 6.5–8.1)

## 2021-07-09 LAB — CBC WITH DIFFERENTIAL/PLATELET
Abs Immature Granulocytes: 0.01 10*3/uL (ref 0.00–0.07)
Basophils Absolute: 0 10*3/uL (ref 0.0–0.1)
Basophils Relative: 1 %
Eosinophils Absolute: 0.1 10*3/uL (ref 0.0–0.5)
Eosinophils Relative: 2 %
HCT: 39.7 % (ref 36.0–46.0)
Hemoglobin: 13.7 g/dL (ref 12.0–15.0)
Immature Granulocytes: 0 %
Lymphocytes Relative: 37 %
Lymphs Abs: 2.2 10*3/uL (ref 0.7–4.0)
MCH: 29.8 pg (ref 26.0–34.0)
MCHC: 34.5 g/dL (ref 30.0–36.0)
MCV: 86.3 fL (ref 80.0–100.0)
Monocytes Absolute: 0.6 10*3/uL (ref 0.1–1.0)
Monocytes Relative: 10 %
Neutro Abs: 3.1 10*3/uL (ref 1.7–7.7)
Neutrophils Relative %: 50 %
Platelets: 276 10*3/uL (ref 150–400)
RBC: 4.6 MIL/uL (ref 3.87–5.11)
RDW: 12.5 % (ref 11.5–15.5)
WBC: 6.1 10*3/uL (ref 4.0–10.5)
nRBC: 0 % (ref 0.0–0.2)

## 2021-07-09 LAB — URINALYSIS, MICROSCOPIC (REFLEX)

## 2021-07-09 LAB — LIPASE, BLOOD: Lipase: 35 U/L (ref 11–51)

## 2021-07-09 MED ORDER — POTASSIUM CHLORIDE CRYS ER 20 MEQ PO TBCR
40.0000 meq | EXTENDED_RELEASE_TABLET | Freq: Once | ORAL | Status: AC
  Administered 2021-07-09: 40 meq via ORAL
  Filled 2021-07-09: qty 2

## 2021-07-09 MED ORDER — SODIUM CHLORIDE 0.9 % IV BOLUS
1000.0000 mL | Freq: Once | INTRAVENOUS | Status: AC
  Administered 2021-07-09: 1000 mL via INTRAVENOUS

## 2021-07-09 MED ORDER — TAMSULOSIN HCL 0.4 MG PO CAPS: 0.4000 mg | ORAL_CAPSULE | Freq: Every day | ORAL | 0 refills | Status: DC

## 2021-07-09 MED ORDER — NALOXONE HCL 4 MG/0.1ML NA LIQD: NASAL | 0 refills | Status: DC

## 2021-07-09 MED ORDER — OXYCODONE-ACETAMINOPHEN 5-325 MG PO TABS: 1.0000 | ORAL_TABLET | Freq: Four times a day (QID) | ORAL | 0 refills | Status: AC | PRN

## 2021-07-09 MED ORDER — CEPHALEXIN 500 MG PO CAPS: 500.0000 mg | ORAL_CAPSULE | Freq: Four times a day (QID) | ORAL | 0 refills | Status: AC

## 2021-07-09 NOTE — ED Notes (Signed)
Patient verbalizes understanding of discharge instructions. Opportunity for questioning and answers were provided. Armband removed by staff, pt discharged from ED. Ambulated out to lobby with spouse  

## 2021-07-09 NOTE — ED Provider Notes (Signed)
[facility] Provider Note   CSN: [id number] Arrival date & time: 07/09/21  2031     History  Chief Complaint  Patient presents with   Nephrolithiasis    Sydney Bailey is a 56 y.o. female.  With past medical history of recurrent kidney stones, migraine who presents to the emergency department with left-sided flank pain.  Patient states that earlier today she began having a migraine when she laid down to take a nap.  She states that right after she woke up from her nap around 530p she began having severe onset of left-sided flank pain.  She describes the pain as sharp, excruciating and constant.  She states that she took pain medication at home without relief of symptoms.  States that she has Dilaudid from previous prescription of previous kidney stone.  I do not see this on the medication list.  She states that she took a total of 3 tablets over about an hour..  Each tablet is 2 mg.  She additionally endorses urinary urgency without dysuria or obvious hematuria.  She has had 2-3 episodes of nausea with vomiting.  She denies abdominal pain, chest pain, shortness of breath.  Denies trauma to the abdomen or back.  Denies fevers.  States that since taking the pain medication the pain is beginning to wear off.  HPI     Home Medications Prior to Admission medications   Medication Sig Start Date End Date Taking? Authorizing Provider  albuterol (VENTOLIN HFA) 108 (90 Base) MCG/ACT inhaler Inhale 1 puff into the lungs every 6 (six) hours as needed for wheezing. 07/01/20   Orma Render, NP  ALPRAZolam Duanne Moron) 0.5 MG tablet Take 0.5-1 tablet up to three times daily as needed for anxiety 06/03/21   Breeback, [name] L, PA-C  AMBULATORY NON FORMULARY MEDICATION Sig: T4 200 mcg and T3 51 mcg NON PORCINE SR capsules take one tablet daily 06/22/21   Breeback, [name] L, PA-C  AMBULATORY NON FORMULARY MEDICATION Progesterone capsules Take two tablets 100mg  SR progesterone  capsules daily at bedtime, 4 days off per month  Estradiol 4 mg/ml apply 3 clicks to inner thighs once daily as directed, 4 days off per month 06/22/21   Breeback, [name] L, PA-C  AMBULATORY NON FORMULARY MEDICATION Naltrexone 3mg  one tablet daily. 07/08/21   Breeback, [name] L, PA-C  B Complex Vitamins (VITAMIN B-COMPLEX PO) Take 1 tablet by mouth daily.    [provider]  citalopram (CELEXA) 40 MG tablet TAKE 1 TABLET BY MOUTH EVERY DAY. 04/27/21   Breeback, [name] L, PA-C  metaxalone (SKELAXIN) 800 MG tablet TAKE 1 TABLET (800 MG TOTAL) BY MOUTH 3 (THREE) TIMES DAILY AS NEEDED FOR MUSCLE SPASMS. 12/15/20   Breeback, Luvenia Starch L, PA-C  metoprolol succinate (TOPROL-XL) 25 MG 24 hr tablet TAKE 1/2 TABLET (12.5 MG TOTAL) BY MOUTH 2 (TWO) TIMES DAILY.**DOSE INCREASE** 12/15/20   Lelon Perla, MD  ondansetron (ZOFRAN-ODT) 8 MG disintegrating tablet Take 1 tablet (8 mg total) by mouth every 8 (eight) hours as needed for nausea. 05/03/21   Breeback, Royetta Car, PA-C  Probiotic Product (PROBIOTIC PO) Take 1 capsule by mouth daily.    [provider]  rizatriptan (MAXALT-MLT) 10 MG disintegrating tablet TAKE 1 TABLET (10 MG TOTAL) BY MOUTH ONCE AS NEEDED FOR MIGRAINE. MAY REPEAT IN 2 HOURS IF NEEDED 11/23/20   Breeback, [name] L, PA-C      Allergies    Baclofen, Cyclobenzaprine, Cyclobenzaprine hcl, Etodolac, Topamax, Topiramate, Iodine, and  Prednisone    Review of Systems   Review of Systems  Constitutional:  Positive for diaphoresis. Negative for fever.  Respiratory:  Negative for shortness of breath.   Cardiovascular:  Negative for chest pain.  Gastrointestinal:  Positive for nausea and vomiting. Negative for abdominal pain.  Genitourinary:  Positive for flank pain and urgency. Negative for decreased urine volume, dysuria and hematuria.  Neurological:  Negative for dizziness and light-headedness.  All other systems reviewed and are negative.  Physical Exam Updated Vital Signs BP 118/64 (BP  Location: Right Arm)    Pulse 71    Temp 97.9 F (36.6 C) (Oral)    Resp 16    Wt 63.5 kg    LMP 09/01/2010    SpO2 100%    BMI 22.26 kg/m  Physical Exam Vitals and nursing note reviewed.  Constitutional:      General: She is not in acute distress.    Appearance: Normal appearance. She is ill-appearing. She is not toxic-appearing.  HENT:     Head: Normocephalic and atraumatic.     Nose: Nose normal.     Mouth/Throat:     Mouth: Mucous membranes are moist.     Pharynx: Oropharynx is clear.  Eyes:     General: No scleral icterus.    Extraocular Movements: Extraocular movements intact.  Cardiovascular:     Rate and Rhythm: Normal rate and regular rhythm.     Pulses: Normal pulses.     Heart sounds: No murmur heard. Pulmonary:     Effort: Pulmonary effort is normal. No respiratory distress.     Breath sounds: Normal breath sounds.  Abdominal:     General: Bowel sounds are normal. There is no distension.     Palpations: Abdomen is soft.     Tenderness: There is abdominal tenderness. There is left CVA tenderness. There is no right CVA tenderness.  Musculoskeletal:        General: Normal range of motion.     Cervical back: Neck supple.  Skin:    General: Skin is warm and dry.     Capillary Refill: Capillary refill takes less than 2 seconds.  Neurological:     General: No focal deficit present.     Mental Status: She is alert and oriented to person, place, and time. Mental status is at baseline.  Psychiatric:        Mood and Affect: Mood normal.        Behavior: Behavior normal.        Thought Content: Thought content normal.        Judgment: Judgment normal.  Flank pain  ED Results / Procedures / Treatments   Labs (all labs ordered are listed, but only abnormal results are displayed) Labs Reviewed  URINALYSIS, ROUTINE W REFLEX MICROSCOPIC - Abnormal; Notable for the following components:      Result Value   Hgb urine dipstick SMALL (*)    Leukocytes,Ua TRACE (*)    All  other components within normal limits  COMPREHENSIVE METABOLIC PANEL - Abnormal; Notable for the following components:   Potassium 3.1 (*)    Glucose, Bld 101 (*)    All other components within normal limits  URINALYSIS, MICROSCOPIC (REFLEX) - Abnormal; Notable for the following components:   Bacteria, UA FEW (*)    All other components within normal limits  URINE CULTURE  LIPASE, BLOOD  CBC WITH DIFFERENTIAL/PLATELET   EKG None  Radiology CT Renal Stone Study  Result Date: 07/09/2021 CLINICAL DATA:  Flank pain, kidney stone suspected. Left flank pain beginning suddenly tonight. EXAM: CT ABDOMEN AND PELVIS WITHOUT CONTRAST TECHNIQUE: Multidetector CT imaging of the abdomen and pelvis was performed following the standard protocol without IV contrast. RADIATION DOSE REDUCTION: This exam was performed according to the departmental dose-optimization program which includes automated exposure control, adjustment of the mA and/or kV according to patient size and/or use of iterative reconstruction technique. COMPARISON:  04/09/2021 FINDINGS: Lower chest: Lung bases are clear. Hepatobiliary: Liver appears normal without contrast. Previous cholecystectomy. Pancreas: Normal Spleen: Normal Adrenals/Urinary Tract: Adrenal glands are normal. The right kidney is normal. No cyst, mass, stone or hydronephrosis. Left kidney shows mild fullness of the renal collecting system and ureter which is dilated as far as the UVJ. There is a 4 x 6 mm stone in the distal ureter at the UVJ. No stone in the bladder. Stomach/Bowel: Stomach and small intestine are normal. There is diverticulosis of the colon without evidence of diverticulitis. Vascular/Lymphatic: Aorta is tortuous but does not show atherosclerotic calcification. IVC is normal. No adenopathy. Reproductive: Bilateral fallopian tube implants.  No pelvic mass. Other: No free fluid or air. Musculoskeletal: Mild lower lumbar degenerative changes. IMPRESSION: 4 x 6 mm  stone in the distal left ureter at the UVJ. Mild left hydroureteronephrosis. No other urinary tract calcifications. No other acute or significant abdominal or pelvic finding. Electronically Signed   By: Nelson Chimes M.D.   On: 07/09/2021 21:28    Procedures Procedures    Medications Ordered in ED Medications  sodium chloride 0.9 % bolus 1,000 mL (0 mLs Intravenous Stopped 07/09/21 2208)  potassium chloride SA (KLOR-CON M) CR tablet 40 mEq (40 mEq Oral Given 07/09/21 2200)    ED Course/ Medical Decision Making/ A&P                           Medical Decision Making Amount and/or Complexity of Data Reviewed Labs: ordered. Radiology: ordered.  Risk Prescription drug management.  Patient presents to the ED with complaints of flank pain. This involves an extensive number of treatment options, and is a complaint that carries with it a moderate risk of complications and morbidity.   Additional history obtained:  Additional history obtained from: Husband at bedside External records from outside source obtained and reviewed including: Previous family medicine visits  Cardiac Monitoring: The patient was maintained on a cardiac monitor.  I personally viewed and interpreted the cardiac monitored which showed an underlying rhythm of: Sinus rhythm  Lab Results: I personally ordered, reviewed, and interpreted labs. Pertinent results include: CMP with potassium of 3.1, replaced CBC without leukocytosis Lipase 35 negative UA with evidence of UTI  Imaging Studies ordered:  I ordered imaging studies which included CT.  I independently reviewed & interpreted imaging & am in agreement with radiology impression. Imaging shows: CT renal stone study shows a 4 x 6 mm stone in the distal ureter at the UVJ on the left side.  Mild left hydroureteronephrosis.  Medications  I ordered medication including IV fluids for fluid resuscitation Reevaluation of the patient after medication shows that patient  stayed the same  ED Course: 56 year old female who presents to the emergency department with flank pain.  Pain secondary to her renal colic from nonobstructed kidney stone.  Given history, exam and work-up I have low suspicion for atypical appendicitis, general torsion, acute cholecystitis, AAA, infected obstructed stone, pyelonephritis or other emergent intra-abdominal pathology.  UA does show evidence of evolving  infection.  BMP without AKI.  Did not give her pain medication here in the ED given that she took a total of 6 mg Dilaudid at home prior to her presenting to the emergency department with currently waning pain.  Patient is appropriate for discharge with outpatient follow-up with urology.  I have prescribed her Flomax, Keflex for UTI, Percocet for pain and Narcan given that she does have access to opioid pain medication at home is not prescribed.  Discussed strict use of opioid pain medication with the patient and husband.  Discussed not to mix opioids with benzodiazepines, alcohol.  Discussed not taking Dilaudid and Percocet.  Discussed reason for Narcan prescription.  They verbalized understanding.  Discussed return to emergency department for worsening abdominal pain, fevers.  Otherwise she will follow-up with urology early next week.  Answered all of patient and husband question at this time.  Safe for discharge   After consideration of the diagnostic results and the patients response to treatment, I feel that the patent would benefit from discharge. The patient has been appropriately medically screened and/or stabilized in the ED. I have low suspicion for any other emergent medical condition which would require further screening, evaluation or treatment in the ED or require inpatient management. The patient is overall well appearing and non-toxic in appearance. They are hemodynamically stable at time of discharge.   Final Clinical Impression(s) / ED Diagnoses Final diagnoses:   Nephrolithiasis    Rx / DC Orders ED Discharge Orders          Ordered    cephALEXin (KEFLEX) 500 MG capsule  4 times daily        07/09/21 2242    tamsulosin (FLOMAX) 0.4 MG CAPS capsule  Daily after supper        07/09/21 2242    oxyCODONE-acetaminophen (PERCOCET/ROXICET) 5-325 MG tablet  Every 6 hours PRN        07/09/21 2242    naloxone (NARCAN) nasal spray 4 mg/0.1 mL        07/09/21 2247              Mickie Hillier, PA-C 07/10/21 1650    Long, Wonda [facility], MD 07/19/21 [phone]

## 2021-07-09 NOTE — Discharge Instructions (Addendum)
You were seen in the emergency department for flank pain. You have a kidney stone. I am prescribing you antibiotics, as well as Flomax and pain medication while you pass this stone. I am providing you with information to Dr. Mena Goes, urology. Please call tomorrow to set up a follow-up appointment. Please return for fever with this pain or inability to urinate.

## 2021-07-09 NOTE — ED Notes (Signed)
No acute distress noted upon this RN's departure of patient. Verified discharge paperwork with name and DOB. Vital signs stable. Patient taken to checkout window. Discharge paperwork discussed with patient. No further questions voiced upon discharge.  ° °

## 2021-07-09 NOTE — ED Triage Notes (Signed)
Pt is here for left flank pain which began suddenly between 6p and 6:30.  Pt states that she took 2mg  dilaudid pta as well as zofran.  Pt also reports migraine headache with this which began today.

## 2021-07-11 ENCOUNTER — Encounter: Payer: Self-pay | Admitting: Physician Assistant

## 2021-07-11 LAB — URINE CULTURE: Culture: NO GROWTH

## 2021-07-13 ENCOUNTER — Ambulatory Visit: Payer: BC Managed Care – PPO | Admitting: Physical Therapy

## 2021-07-13 ENCOUNTER — Other Ambulatory Visit: Payer: Self-pay

## 2021-07-13 DIAGNOSIS — M6281 Muscle weakness (generalized): Secondary | ICD-10-CM

## 2021-07-13 DIAGNOSIS — M546 Pain in thoracic spine: Secondary | ICD-10-CM | POA: Diagnosis not present

## 2021-07-13 DIAGNOSIS — G8929 Other chronic pain: Secondary | ICD-10-CM

## 2021-07-13 DIAGNOSIS — R293 Abnormal posture: Secondary | ICD-10-CM

## 2021-07-13 DIAGNOSIS — R29898 Other symptoms and signs involving the musculoskeletal system: Secondary | ICD-10-CM | POA: Diagnosis not present

## 2021-07-13 NOTE — Therapy (Signed)
[facility] [place], [place], [address] Phone: [phone]   Fax:  [phone]  Physical Therapy Treatment and Discharge  Patient Details  Name: Sydney Bailey MRN: [id number] Date of Birth: 05-26-1966 Referring Provider (PT): Dr Audelia Hives   Encounter Date: 07/13/2021   PT End of Session - 07/13/21 0829     Visit Number 6    Number of Visits 12    Date for PT Re-Evaluation 08/01/21    PT Start Time 0800    PT Stop Time 0828    PT Time Calculation (min) 28 min    Activity Tolerance Patient tolerated treatment well    Behavior During Therapy [facility] for tasks assessed/performed             Past Medical History:  Diagnosis Date   Allergy    Anxiety    Depression    Family history of anesthesia complication    Father had difficult time waking up    Heart palpitations    PVC's   Hypothyroidism 11-08   started post- partum   Migraines    PONV (postoperative nausea and vomiting)    Difficult waking up; had bad migraine after bunionectomy   PVC's (premature ventricular contractions)    Pt saw Dr. Stanford Breed for this but pt stated it was fine    Past Surgical History:  Procedure Laterality Date   BUNIONECTOMY Bilateral    x 2 on both feet   CHOLECYSTECTOMY N/A 05/22/2013   Procedure: LAPAROSCOPIC CHOLECYSTECTOMY;  Surgeon: Harl Bowie, MD;  Location: [facility];  Service: General;  Laterality: N/A;   essure BTL     UPPER GI ENDOSCOPY      There were no vitals filed for this visit.   Subjective Assessment - 07/13/21 0801     Subjective Pt had a kidney stone over the weekend so she was in a lot of pain    Patient Stated Goals learn exercises to try to upper back between shoulder blades    Currently in Pain? Yes    Pain Score 2     Pain Location Back    Pain Orientation Upper;Mid                               [facility] Adult PT Treatment/Exercise - 07/13/21 0001        Shoulder Exercises: Prone   Other Prone Exercises I's, Y's, T's 2x10 each    Other Prone Exercises on physioball      Shoulder Exercises: Standing   Horizontal ABduction Strengthening;20 reps    Theraband Level (Shoulder Horizontal ABduction) Level 2 (Red)    External Rotation Strengthening;Both;20 reps;Theraband    Theraband Level (Shoulder External Rotation) Level 2 (Red)    Row Strengthening;Both;Theraband;20 reps    Theraband Level (Shoulder Row) Level 3 (Green)    Row Limitations bow and arrow x 10 x3 sec each side green TB      Shoulder Exercises: ROM/Strengthening   UBE (Upper Arm Bike) L4 x 4 min alt fwd/bkwd      Shoulder Exercises: Stretch   Other Shoulder Stretches open book x 10 bilat    Other Shoulder Stretches prolonged snow angel on green foam roll ~ 2 min                     PT Education - 07/13/21 0828     Education Details plan  for d/c, finalized HEP    Person(s) Educated Patient    Methods Explanation;Demonstration;Handout    Comprehension Verbalized understanding;Returned demonstration                 PT Long Term Goals - 07/13/21 0805       PT LONG TERM GOAL #1   Title Decrease pain in upper back byt 50-75% allowing patient to perform normal functional activities    Baseline 20-25%    Status Not Met      PT LONG TERM GOAL #2   Title Improve posture and alignment with improved posterior shoulder girdle activation to improve upright posture    Status Achieved      PT LONG TERM GOAL #3   Title Full pain free cervical ROM to improve functional activity level    Baseline limited Rt sidebending    Status Not Met      PT LONG TERM GOAL #4   Title Independent in HEP    Status Achieved      PT LONG TERM GOAL #5   Title Patient to verbalize understanding of importance of continuing with consistent HEP after d/c from PT    Status Achieved                   Plan - 07/13/21 0829     Clinical Impression Statement Pt  has made good progress and met 3/5 goals. Pt is comfortable with d/c to HEP today    PT Next Visit Plan d/c    PT Home Exercise Plan [email or web address]    Consulted and Agree with Plan of Care Patient             Patient will benefit from skilled therapeutic intervention in order to improve the following deficits and impairments:     Visit Diagnosis: Chronic bilateral thoracic back pain  Abnormal posture  Other symptoms and signs involving the musculoskeletal system  Muscle weakness (generalized)     Problem List Patient Active Problem List   Diagnosis Date Noted   Hematuria 05/04/2021   Left flank pain 05/04/2021   Nausea and vomiting 05/04/2021   History of kidney stones 05/04/2021   Hot flashes 05/03/2021   Large breasts 05/03/2021   Upper back pain 05/03/2021   Kidney stone on left side 04/27/2021   DDD (degenerative disc disease), lumbar 10/13/2020   Facet arthritis of lumbar region 10/13/2020   Right hip pain 10/11/2020   Chronic right-sided low back pain with right-sided sciatica 10/11/2020   Recurrent major depressive disorder, in full remission ([place]) 07/20/2020   Persistent shortness of breath after COVID-19 07/01/2020   Hyperlipidemia LDL goal <160 05/07/2018   No energy 10/14/2017   Vertigo 10/14/2017   Menopause 03/27/2017   ETD (Eustachian tube dysfunction), right 03/27/2017   Low serum progesterone 06/09/2016   Ventricular quadrigeminy 04/21/2016   PVC's (premature ventricular contractions) 04/21/2016   Depression 08/11/2014   Palpitations 03/19/2013   Dyspnea 03/19/2013   PANIC ATTACK 10/02/2008   MENORRHAGIA 07/27/2008   Hypothyroidism 11/22/2007   Migraine without aura 11/22/2007   PHYSICAL THERAPY DISCHARGE SUMMARY  Visits from Start of Care: 6  Current functional level related to goals / functional outcomes: Improved activity tolerance and posture   Remaining deficits: pain   Education / Equipment: HEP   Patient agrees to discharge.  Patient goals were partially met. Patient is being discharged due to being pleased with the current functional level.  [name], PT 07/13/2021, 8:32 AM  [facility] [address]  [place], [place], [address] Phone: [phone]   Fax:  [phone]  Name: Sydney Bailey MRN: [id number] Date of Birth: 03-01-1966

## 2021-07-13 NOTE — Patient Instructions (Signed)
Access Code: [email or web address] URL: [email or web address] Date: 07/13/2021 Prepared by: Isabelle Course  Exercises Seated Cervical Retraction - 3 x daily - 7 x weekly - 1 sets - 10 reps Doorway Pec Stretch at 90 Degrees Abduction - 3 x daily - 7 x weekly - 1 sets - 3 reps - 30 seconds hold Standing Bilateral Low Shoulder Row with Anchored Resistance - 2 x daily - 7 x weekly - 1-3 sets - 10 reps - 2-3 sec hold Drawing Bow - 1 x daily - 7 x weekly - 1 sets - 10 reps - 3 sec hold Standing Shoulder External Rotation with Resistance - 2 x daily - 7 x weekly - 1-3 sets - 10 reps - 2-3 sec hold Standing Shoulder Horizontal Abduction with Resistance - 1 x daily - 7 x weekly - 1-3 sets - 10 reps - 2-3 sec hold Sidelying Open Book Thoracic Lumbar Rotation and Extension - 1 x daily - 7 x weekly - 2 sets - 10 reps

## 2021-07-29 ENCOUNTER — Other Ambulatory Visit: Payer: Self-pay

## 2021-07-29 ENCOUNTER — Encounter: Payer: Self-pay | Admitting: Physician Assistant

## 2021-07-29 ENCOUNTER — Ambulatory Visit (INDEPENDENT_AMBULATORY_CARE_PROVIDER_SITE_OTHER): Payer: BC Managed Care – PPO | Admitting: Physician Assistant

## 2021-07-29 VITALS — BP 116/78 | HR 57 | Ht 66.0 in | Wt 144.0 lb

## 2021-07-29 DIAGNOSIS — M546 Pain in thoracic spine: Secondary | ICD-10-CM

## 2021-07-29 DIAGNOSIS — Z79899 Other long term (current) drug therapy: Secondary | ICD-10-CM | POA: Diagnosis not present

## 2021-07-29 DIAGNOSIS — E039 Hypothyroidism, unspecified: Secondary | ICD-10-CM

## 2021-07-29 DIAGNOSIS — R7989 Other specified abnormal findings of blood chemistry: Secondary | ICD-10-CM | POA: Diagnosis not present

## 2021-07-29 DIAGNOSIS — N62 Hypertrophy of breast: Secondary | ICD-10-CM

## 2021-07-29 DIAGNOSIS — Z780 Asymptomatic menopausal state: Secondary | ICD-10-CM

## 2021-07-29 DIAGNOSIS — G8929 Other chronic pain: Secondary | ICD-10-CM

## 2021-07-29 NOTE — Progress Notes (Signed)
Subjective:    Patient ID: Sydney Bailey, female    DOB: 1966-02-10, 56 y.o.   MRN: [id number]  HPI Pt is a 56 yo female with hypothyroidism, menopausal symptoms, large breast with chronic thoracic back pain.   She has follow up in march with Dr. Gwenyth Bailey for breast reduction surgery.   She is on hormonal replacement with compounding [facility]. The estrogen cream that was added continues to to have excessive sweating in the low back, groin area.   .. Active Ambulatory Problems    Diagnosis Date Noted   Hypothyroidism 11/22/2007   PANIC ATTACK 10/02/2008   Migraine without aura 11/22/2007   MENORRHAGIA 07/27/2008   Palpitations 03/19/2013   Dyspnea 03/19/2013   Depression 08/11/2014   Ventricular quadrigeminy 04/21/2016   PVC's (premature ventricular contractions) 04/21/2016   Low serum progesterone 06/09/2016   Menopause 03/27/2017   ETD (Eustachian tube dysfunction), right 03/27/2017   No energy 10/14/2017   Vertigo 10/14/2017   Hyperlipidemia LDL goal <160 05/07/2018   Persistent shortness of breath after COVID-19 07/01/2020   Recurrent major depressive disorder, in full remission ([place]) 07/20/2020   Right hip pain 10/11/2020   Chronic right-sided low back pain with right-sided sciatica 10/11/2020   DDD (degenerative disc disease), lumbar 10/13/2020   Facet arthritis of lumbar region 10/13/2020   Kidney stone on left side 04/27/2021   Hot flashes 05/03/2021   Large breasts 05/03/2021   Upper back pain 05/03/2021   Hematuria 05/04/2021   Left flank pain 05/04/2021   Nausea and vomiting 05/04/2021   History of kidney stones 05/04/2021   Resolved Ambulatory Problems    Diagnosis Date Noted   SYNCOPE 02/20/2008   POSTURAL LIGHTHEADEDNESS 12/25/2007   FATIGUE 07/27/2008   Past Medical History:  Diagnosis Date   Allergy    Anxiety    Family history of anesthesia complication    Heart palpitations    Migraines    PONV (postoperative nausea and  vomiting)      Review of Systems    See HPI.  Objective:   Physical Exam Vitals reviewed.  Constitutional:      Appearance: Normal appearance.  HENT:     Head: Normocephalic.  Cardiovascular:     Rate and Rhythm: Normal rate and regular rhythm.     Pulses: Normal pulses.     Heart sounds: Normal heart sounds.  Pulmonary:     Effort: Pulmonary effort is normal.     Breath sounds: Normal breath sounds.     Comments: Large breast with shoulder notching Musculoskeletal:     Right lower leg: No edema.     Left lower leg: No edema.  Neurological:     General: No focal deficit present.     Mental Status: She is alert and oriented to person, place, and time.  Psychiatric:        Mood and Affect: Mood normal.   .. Depression screen [facility] 2/9 07/29/2021 10/11/2020 08/13/2020 07/16/2019 05/07/2018  Decreased Interest 1 0 1 0 0  Down, Depressed, Hopeless 1 0 1 1 0  PHQ - 2 Score 2 0 2 1 0  Altered sleeping - - 1 1 1   Tired, decreased energy - - 1 1 1   Change in appetite - - 1 1 0  Feeling bad or failure about yourself  - - 3 0 0  Trouble concentrating - - 0 0 0  Moving slowly or fidgety/restless - - 0 0 0  Suicidal thoughts - -  0 0 0  PHQ-9 Score - - 8 4 2   Difficult doing work/chores - - Extremely dIfficult Not difficult at all Not difficult at all         Assessment & Plan:  Marland KitchenMarland KitchenYocheved was seen today for follow-up.  Diagnoses and all orders for this visit:  Hypothyroidism, unspecified type -     Thyroid Panel With TSH -     Estradiol -     BASIC METABOLIC PANEL WITH GFR -     Progesterone  Low serum progesterone -     Thyroid Panel With TSH -     Estradiol -     BASIC METABOLIC PANEL WITH GFR -     Progesterone  Medication management -     Thyroid Panel With TSH -     Estradiol -     BASIC METABOLIC PANEL WITH GFR -     Progesterone  Menopause -     Thyroid Panel With TSH -     Estradiol -     BASIC METABOLIC PANEL WITH GFR -     Progesterone  Large  breasts  Chronic bilateral thoracic back pain   Labs ordered for recheck. Will make adjustments as needed.  Naltrexone suggested stopping before breast reduction surgery so that pain control could be reached.

## 2021-08-01 ENCOUNTER — Encounter: Payer: Self-pay | Admitting: Physician Assistant

## 2021-08-01 DIAGNOSIS — G8929 Other chronic pain: Secondary | ICD-10-CM | POA: Insufficient documentation

## 2021-08-01 DIAGNOSIS — M546 Pain in thoracic spine: Secondary | ICD-10-CM | POA: Insufficient documentation

## 2021-08-15 ENCOUNTER — Encounter: Payer: Self-pay | Admitting: Physician Assistant

## 2021-08-16 ENCOUNTER — Encounter: Payer: Self-pay | Admitting: Plastic Surgery

## 2021-08-16 ENCOUNTER — Ambulatory Visit (INDEPENDENT_AMBULATORY_CARE_PROVIDER_SITE_OTHER): Payer: BC Managed Care – PPO | Admitting: Plastic Surgery

## 2021-08-16 ENCOUNTER — Other Ambulatory Visit: Payer: Self-pay

## 2021-08-16 DIAGNOSIS — G8929 Other chronic pain: Secondary | ICD-10-CM

## 2021-08-16 DIAGNOSIS — M5441 Lumbago with sciatica, right side: Secondary | ICD-10-CM

## 2021-08-16 DIAGNOSIS — M546 Pain in thoracic spine: Secondary | ICD-10-CM

## 2021-08-16 DIAGNOSIS — M47816 Spondylosis without myelopathy or radiculopathy, lumbar region: Secondary | ICD-10-CM

## 2021-08-16 DIAGNOSIS — M5136 Other intervertebral disc degeneration, lumbar region: Secondary | ICD-10-CM

## 2021-08-16 DIAGNOSIS — N62 Hypertrophy of breast: Secondary | ICD-10-CM

## 2021-08-16 DIAGNOSIS — M549 Dorsalgia, unspecified: Secondary | ICD-10-CM

## 2021-08-16 NOTE — Progress Notes (Signed)
? ?  Subjective:  ? ? Patient ID: Sydney Bailey, female    DOB: [birth year], 56 y.o.   MRN: [id number] ? ?The patient is a 56 year old female here for follow-up on her breast.  She complains of chronic neck and back pain.  She has some chronic issues with her spine.  She has a sternal to nipple distance on the right of 33 cm and 31 cm on the left.  She is 5 feet 6 inches tall and weighs 144 pounds.  Her preoperative bra size is a 40 DD.  She would like to be a C cup.  The estimated amount for removal is between 404 150 for both breasts.  Her mammogram is up-to-date and she is not a smoker. ? ? ? ? ?Review of Systems  ?Constitutional:  Positive for activity change. Negative for appetite change.  ?Eyes: Negative.   ?Respiratory: Negative.  Negative for chest tightness and shortness of breath.   ?Cardiovascular:  Negative for leg swelling.  ?Gastrointestinal: Negative.   ?Endocrine: Negative.   ?Genitourinary: Negative.   ?Musculoskeletal:  Positive for back pain and neck pain.  ?Skin: Negative.   ?Hematological: Negative.   ?Psychiatric/Behavioral: Negative.    ? ?   ?Objective:  ? Physical Exam ?Constitutional:   ?   Appearance: Normal appearance.  ?HENT:  ?   Head: Normocephalic and atraumatic.  ?Cardiovascular:  ?   Rate and Rhythm: Normal rate.  ?   Pulses: Normal pulses.  ?Pulmonary:  ?   Effort: Pulmonary effort is normal. No respiratory distress.  ?   Breath sounds: No wheezing.  ?Abdominal:  ?   General: There is no distension.  ?   Palpations: Abdomen is soft.  ?   Tenderness: There is no abdominal tenderness.  ?Musculoskeletal:     ?   General: No swelling or deformity.  ?Skin: ?   General: Skin is warm.  ?   Coloration: Skin is not jaundiced.  ?   Findings: No bruising or lesion.  ?Neurological:  ?   Mental Status: She is alert and oriented to person, place, and time.  ?Psychiatric:     ?   Mood and Affect: Mood normal.     ?   Behavior: Behavior normal.     ?   Thought Content: Thought content normal.      ?   Judgment: Judgment normal.  ? ?   ?Assessment & Plan:  ? ?  ICD-10-CM   ?1. Upper back pain  M54.9   ?  ?2. Large breasts  N62   ?  ?3. Chronic bilateral thoracic back pain  M54.6   ? G89.29   ?  ?4. Facet arthritis of lumbar region  M47.816   ?  ?5. DDD (degenerative disc disease), lumbar  M51.36   ?  ?6. Chronic right-sided low back pain with right-sided sciatica  M54.41   ? G89.29   ?  ?  ?Pictures are in the chart.  The patient is a candidate for bilateral breast reduction with possible liposuction.  The risks and complications were discussed. ? ? ? ?

## 2021-09-13 DIAGNOSIS — Z79899 Other long term (current) drug therapy: Secondary | ICD-10-CM | POA: Diagnosis not present

## 2021-09-13 DIAGNOSIS — Z780 Asymptomatic menopausal state: Secondary | ICD-10-CM | POA: Diagnosis not present

## 2021-09-13 DIAGNOSIS — E039 Hypothyroidism, unspecified: Secondary | ICD-10-CM | POA: Diagnosis not present

## 2021-09-13 DIAGNOSIS — R7989 Other specified abnormal findings of blood chemistry: Secondary | ICD-10-CM | POA: Diagnosis not present

## 2021-09-14 LAB — BASIC METABOLIC PANEL WITH GFR
BUN: 8 mg/dL (ref 7–25)
CO2: 28 mmol/L (ref 20–32)
Calcium: 9.1 mg/dL (ref 8.6–10.4)
Chloride: 102 mmol/L (ref 98–110)
Creat: 0.53 mg/dL (ref 0.50–1.03)
Glucose, Bld: 162 mg/dL — ABNORMAL HIGH (ref 65–139)
Potassium: 3.9 mmol/L (ref 3.5–5.3)
Sodium: 136 mmol/L (ref 135–146)
eGFR: 111 mL/min/{1.73_m2} (ref >=60)

## 2021-09-14 LAB — THYROID PANEL WITH TSH
Free Thyroxine Index: 3.5 (ref 1.4–3.8)
T3 Uptake: 36 % — ABNORMAL HIGH (ref 22–35)
T4, Total: 9.7 ug/dL (ref 5.1–11.9)
TSH: <0.01 mIU/L — ABNORMAL LOW

## 2021-09-14 LAB — ESTRADIOL: Estradiol: 121 pg/mL

## 2021-09-14 LAB — PROGESTERONE: Progesterone: 5.2 ng/mL

## 2021-09-15 ENCOUNTER — Encounter: Payer: Self-pay | Admitting: Physician Assistant

## 2021-10-07 NOTE — Progress Notes (Signed)
? ?  Patient ID: Sydney Bailey, female    DOB: [birth year], 56 y.o.   MRN: [id number] ? ?Chief Complaint  ?Patient presents with  ? Pre-op Exam  ? ? ?  ICD-10-CM   ?1. Macromastia  N62   ?  ? ? ? ?History of Present Illness: ?Sydney Bailey is a 56 y.o.  female  with a history of macromastia.  She presents for preoperative evaluation for upcoming procedure, bilateral breast reduction with possible liposuction, scheduled for 11/02/2021 with Dr. Ulice Bold. ? ?The patient has not had problems with anesthesia aside from mild PONV.  Patient reports that she is being treated for Hashimoto's hypothyroidism with naltrexone.  Instructed patient to hold naltrexone 72 hours before surgery.  She may resume it after she no longer requires any narcotic analgesics during her postoperative recovery.  She is on estrogen and progesterone hormone replacement for vasomotor symptoms in the setting of menopause.  After discussion with patient and lack of any other significant risk factors for DVT/PE, will not hold these medications perioperatively.  She will hold her supplements and other multivitamins.  She denies any personal or family history of blood clots or clotting disorder.  No personal history of MI, CVA, or cancer.  No history of asthma or COPD.  She confirms that she is a 40 DD cup and would like to be a C cup. ? ?Summary of Previous Visit: Patient was seen for follow-up consult on 08/16/2021.  At that time, she continued to complain of chronic neck and back discomfort in the context of her large breasts.  STN 33 cm on the right, 31 cm on the left.  Preoperative bra size equals 40 DD cup.  She expressed that she would like to be a C cup.  Estimated excess breast tissue to be removed at time of surgery is between 400-450 cc for each breast.  Mammogram reportedly up-to-date. ? 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  States that FMLA would not be required and she is already spoken about this with  her employer. ? ?PMH Significant for: Macromastia, kidney stones, MDD, headache disorder thyroid disorder, PVCs, vasomotor symptoms. ? ? ?Past Medical History: ?Allergies: ?Allergies  ?Allergen Reactions  ? Baclofen Swelling  ?  REACTION: hives ?Other reaction(s): Edema ?REACTION: hives ?  ? Cyclobenzaprine Hives  ?  REACTION: hives ?  ? Cyclobenzaprine Hcl   ?  REACTION: hives  ? Lodine [Etodolac] Hives  ? Topamax Other (See Comments)  ?  Vertigo as well as every side effect that is caused by this med.  ? Topiramate Other (See Comments)  ?  Other reaction(s): Dizziness, Lightheadedness ?Vertigo as well as every side effect that is caused by this med. ?  ? Iodine   ? Prednisone Other (See Comments)  ?  Aggitation  ? ? ?Current Medications: ? ?Current Outpatient Medications:  ?  albuterol (VENTOLIN HFA) 108 (90 Base) MCG/ACT inhaler, Inhale 1 puff into the lungs every 6 (six) hours as needed for wheezing., Disp: 8 g, Rfl: 3 ?  ALPRAZolam (XANAX) 0.5 MG tablet, Take 0.5-1 tablet up to three times daily as needed for anxiety, Disp: 20 tablet, Rfl: 1 ?  AMBULATORY NON FORMULARY MEDICATION, Sig: T4 200 mcg and T3 51 mcg NON PORCINE SR capsules take one tablet daily, Disp: 90 capsule, Rfl: 1 ?  AMBULATORY NON FORMULARY MEDICATION, Progesterone capsules Take two tablets 100mg  SR progesterone capsules daily at bedtime, 4 days off per month  Estradiol 4 mg/ml apply 3 clicks to inner thighs once daily as directed, 4 days off per month, Disp: 180 capsule, Rfl: 1 ?  AMBULATORY NON FORMULARY MEDICATION, Naltrexone 3mg  one tablet daily., Disp: 90 tablet, Rfl: 1 ?  B Complex Vitamins (VITAMIN B-COMPLEX PO), Take 1 tablet by mouth daily., Disp: , Rfl:  ?  citalopram (CELEXA) 40 MG tablet, TAKE 1 TABLET BY MOUTH EVERY DAY., Disp: 90 tablet, Rfl: 3 ?  metaxalone (SKELAXIN) 800 MG tablet, TAKE 1 TABLET (800 MG TOTAL) BY MOUTH 3 (THREE) TIMES DAILY AS NEEDED FOR MUSCLE SPASMS., Disp: 90 tablet, Rfl: 0 ?  metoprolol succinate  (TOPROL-XL) 25 MG 24 hr tablet, TAKE 1/2 TABLET (12.5 MG TOTAL) BY MOUTH 2 (TWO) TIMES DAILY.**DOSE INCREASE**, Disp: 90 tablet, Rfl: 3 ?  ondansetron (ZOFRAN-ODT) 8 MG disintegrating tablet, Take 1 tablet (8 mg total) by mouth every 8 (eight) hours as needed for nausea., Disp: 20 tablet, Rfl: 1 ?  Probiotic Product (PROBIOTIC PO), Take 1 capsule by mouth daily., Disp: , Rfl:  ?  rizatriptan (MAXALT-MLT) 10 MG disintegrating tablet, TAKE 1 TABLET (10 MG TOTAL) BY MOUTH ONCE AS NEEDED FOR MIGRAINE. MAY REPEAT IN 2 HOURS IF NEEDED, Disp: 12 tablet, Rfl: 5 ?  tamsulosin (FLOMAX) 0.4 MG CAPS capsule, Take 1 capsule (0.4 mg total) by mouth daily after supper., Disp: 30 capsule, Rfl: 0 ? ?Past Medical Problems: ?Past Medical History:  ?Diagnosis Date  ? Allergy   ? Anxiety   ? Depression   ? Family history of anesthesia complication   ? Father had difficult time waking up   ? Heart palpitations   ? PVC's  ? Hypothyroidism 11-08  ? started post- partum  ? Migraines   ? PONV (postoperative nausea and vomiting)   ? Difficult waking up; had bad migraine after bunionectomy  ? PVC's (premature ventricular contractions)   ? Pt saw Dr. 13-08 for this but pt stated it was fine  ? ? ?Past Surgical History: ?Past Surgical History:  ?Procedure Laterality Date  ? BUNIONECTOMY Bilateral   ? x 2 on both feet  ? CHOLECYSTECTOMY N/A 05/22/2013  ? Procedure: LAPAROSCOPIC CHOLECYSTECTOMY;  Surgeon: 05/24/2013, MD;  Location: [facility] OR;  Service: General;  Laterality: N/A;  ? essure BTL    ? UPPER GI ENDOSCOPY    ? ? ?Social History: ?Social History  ? ?Socioeconomic History  ? Marital status: Married  ?  Spouse name: Not on file  ? Number of children: 3  ? Years of education: Not on file  ? Highest education level: Not on file  ?Occupational History  ? Occupation: [employer]  ?Tobacco Use  ? Smoking status: Never  ? Smokeless tobacco: Never  ?Substance and Sexual Activity  ? Alcohol use: No  ? Drug use: No  ? Sexual activity: Not  on file  ?Other Topics Concern  ? Not on file  ?Social History Narrative  ? Not on file  ? ?Social Determinants of Health  ? ?Financial Resource Strain: Not on file  ?Food Insecurity: Not on file  ?Transportation Needs: Not on file  ?Physical Activity: Not on file  ?Stress: Not on file  ?Social Connections: Not on file  ?Intimate Partner Violence: Not on file  ? ? ?Family History: ?Family History  ?Problem Relation Age of Onset  ? Heart disease Father   ?     Atrial fibrillation  ? Atrial fibrillation Father   ? Atrial fibrillation Mother   ? Cancer Maternal  Aunt   ?     breast  ? Breast cancer Maternal Aunt   ? Cancer Paternal Aunt   ?     non hodgkins lymphoma  ? Cancer Maternal Grandmother   ?     breast  ? Breast cancer Maternal Grandmother   ? ? ?Review of Systems: ?ROS ?Denies any recent chest pain, difficulty breathing, fevers, or leg swelling. ? ?Physical Exam: ?Vital Signs ?BP 112/61 (BP Location: Left Arm, Patient Position: Sitting, Cuff Size: Normal)   Pulse 94   Ht 5' 6.5" (1.689 m)   Wt 141 lb (64 kg)   LMP 09/01/2010   SpO2 97%   BMI 22.42 kg/m?  ? ?Physical Exam ?Constitutional:   ?   General: Not in acute distress. ?   Appearance: Normal appearance. Not ill-appearing.  ?HENT:  ?   Head: Normocephalic and atraumatic.  ?Eyes:  ?   Pupils: Pupils are equal, round. ?Cardiovascular:  ?   Rate and Rhythm: Normal rate. ?   Pulses: Normal pulses.  ?Pulmonary:  ?   Effort: No respiratory distress or increased work of breathing.  Speaks in full sentences. ?Abdominal:  ?   General: Abdomen is flat. No distension.   ?Musculoskeletal: Normal range of motion. No lower extremity swelling or edema. No varicosities. ?Skin: ?   General: Skin is warm and dry.  ?   Findings: No erythema or rash.  ?Neurological:  ?   Mental Status: Alert and oriented to person, place, and time.  ?Psychiatric:     ?   Mood and Affect: Mood normal.     ?   Behavior: Behavior normal.  ? ? ?Assessment/Plan: ?The patient is scheduled  for bilateral breast reduction with possible liposuction with Dr. Ulice Boldillingham.  Risks, benefits, and alternatives of procedure discussed, questions answered and consent obtained.   ? ?Smoking Status: Non-smoker. ?Last

## 2021-10-07 NOTE — H&P (View-Only) (Signed)
Patient ID: Sydney Bailey, female    DOB: 06-21-1965, 56 y.o.   MRN: [id number]  Chief Complaint  Patient presents with   Pre-op Exam      ICD-10-CM   1. Macromastia  N62        History of Present Illness: Sydney Bailey is a 56 y.o.  female  with a history of macromastia.  She presents for preoperative evaluation for upcoming procedure, bilateral breast reduction with possible liposuction, scheduled for 11/02/2021 with Dr. Ulice Bold.  The patient has not had problems with anesthesia aside from mild PONV.  Patient reports that she is being treated for Hashimoto's hypothyroidism with naltrexone.  Instructed patient to hold naltrexone 72 hours before surgery.  She may resume it after she no longer requires any narcotic analgesics during her postoperative recovery.  She is on estrogen and progesterone hormone replacement for vasomotor symptoms in the setting of menopause.  After discussion with patient and lack of any other significant risk factors for DVT/PE, will not hold these medications perioperatively.  She will hold her supplements and other multivitamins.  She denies any personal or family history of blood clots or clotting disorder.  No personal history of MI, CVA, or cancer.  No history of asthma or COPD.  She confirms that she is a 40 DD cup and would like to be a C cup.  Summary of Previous Visit: Patient was seen for follow-up consult on 08/16/2021.  At that time, she continued to complain of chronic neck and back discomfort in the context of her large breasts.  STN 33 cm on the right, 31 cm on the left.  Preoperative bra size equals 40 DD cup.  She expressed that she would like to be a C cup.  Estimated excess breast tissue to be removed at time of surgery is between 400-450 cc for each breast.  Mammogram reportedly up-to-date.  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  States that FMLA would not be required and she is already spoken about this with  her employer.  PMH Significant for: Macromastia, kidney stones, MDD, headache disorder thyroid disorder, PVCs, vasomotor symptoms.   Past Medical History: Allergies: Allergies  Allergen Reactions   Baclofen Swelling    REACTION: hives Other reaction(s): Edema REACTION: hives    Cyclobenzaprine Hives    REACTION: hives    Cyclobenzaprine Hcl     REACTION: hives   Lodine [Etodolac] Hives   Topamax Other (See Comments)    Vertigo as well as every side effect that is caused by this med.   Topiramate Other (See Comments)    Other reaction(s): Dizziness, Lightheadedness Vertigo as well as every side effect that is caused by this med.    Iodine    Prednisone Other (See Comments)    Aggitation    Current Medications:  Current Outpatient Medications:    albuterol (VENTOLIN HFA) 108 (90 Base) MCG/ACT inhaler, Inhale 1 puff into the lungs every 6 (six) hours as needed for wheezing., Disp: 8 g, Rfl: 3   ALPRAZolam (XANAX) 0.5 MG tablet, Take 0.5-1 tablet up to three times daily as needed for anxiety, Disp: 20 tablet, Rfl: 1   AMBULATORY NON FORMULARY MEDICATION, Sig: T4 200 mcg and T3 51 mcg NON PORCINE SR capsules take one tablet daily, Disp: 90 capsule, Rfl: 1   AMBULATORY NON FORMULARY MEDICATION, Progesterone capsules Take two tablets 100mg  SR progesterone capsules daily at bedtime, 4 days off per month  Estradiol 4 mg/ml apply 3 clicks to inner thighs once daily as directed, 4 days off per month, Disp: 180 capsule, Rfl: 1   AMBULATORY NON FORMULARY MEDICATION, Naltrexone  one tablet daily., Disp: 90 tablet, Rfl: 1   B Complex Vitamins (VITAMIN B-COMPLEX PO), Take 1 tablet by mouth daily., Disp: , Rfl:    citalopram (CELEXA) 40 MG tablet, TAKE 1 TABLET BY MOUTH EVERY DAY., Disp: 90 tablet, Rfl: 3   metaxalone (SKELAXIN) 800 MG tablet, TAKE 1 TABLET (800 MG TOTAL) BY MOUTH 3 (THREE) TIMES DAILY AS NEEDED FOR MUSCLE SPASMS., Disp: 90 tablet, Rfl: 0   metoprolol succinate  (TOPROL-XL) 25 MG 24 hr tablet, TAKE 1/2 TABLET (12.5 MG TOTAL) BY MOUTH 2 (TWO) TIMES DAILY.**DOSE INCREASE**, Disp: 90 tablet, Rfl: 3   ondansetron (ZOFRAN-ODT) 8 MG disintegrating tablet, Take 1 tablet (8 mg total) by mouth every 8 (eight) hours as needed for nausea., Disp: 20 tablet, Rfl: 1   Probiotic Product (PROBIOTIC PO), Take 1 capsule by mouth daily., Disp: , Rfl:    rizatriptan (MAXALT-MLT) 10 MG disintegrating tablet, TAKE 1 TABLET (10 MG TOTAL) BY MOUTH ONCE AS NEEDED FOR MIGRAINE. MAY REPEAT IN 2 HOURS IF NEEDED, Disp: 12 tablet, Rfl: 5   tamsulosin (FLOMAX) 0.4 MG CAPS capsule, Take 1 capsule (0.4 mg total) by mouth daily after supper., Disp: 30 capsule, Rfl: 0  Past Medical Problems: Past Medical History:  Diagnosis Date   Allergy    Anxiety    Depression    Family history of anesthesia complication    Father had difficult time waking up    Heart palpitations    PVC's   Hypothyroidism 11-08   started post- partum   Migraines    PONV (postoperative nausea and vomiting)    Difficult waking up; had bad migraine after bunionectomy   PVC's (premature ventricular contractions)    Pt saw Dr. Jens Som for this but pt stated it was fine    Past Surgical History: Past Surgical History:  Procedure Laterality Date   BUNIONECTOMY Bilateral    x 2 on both feet   CHOLECYSTECTOMY N/A 05/22/2013   Procedure: LAPAROSCOPIC CHOLECYSTECTOMY;  Surgeon: Shelly Rubenstein, MD;  Location: [facility] OR;  Service: General;  Laterality: N/A;   essure BTL     UPPER GI ENDOSCOPY      Social History: Social History   Socioeconomic History   Marital status: Married    Spouse name: Not on file   Number of children: 3   Years of education: Not on file   Highest education level: Not on file  Occupational History   Occupation: Print production planner  Tobacco Use   Smoking status: Never   Smokeless tobacco: Never  Substance and Sexual Activity   Alcohol use: No   Drug use: No   Sexual activity: Not  on file  Other Topics Concern   Not on file  Social History Narrative   Not on file   Social Determinants of Health   Financial Resource Strain: Not on file  Food Insecurity: Not on file  Transportation Needs: Not on file  Physical Activity: Not on file  Stress: Not on file  Social Connections: Not on file  Intimate Partner Violence: Not on file    Family History: Family History  Problem Relation Age of Onset   Heart disease Father        Atrial fibrillation   Atrial fibrillation Father    Atrial fibrillation Mother    Cancer Maternal  Aunt        breast   Breast cancer Maternal Aunt    Cancer Paternal Aunt        non hodgkins lymphoma   Cancer Maternal Grandmother        breast   Breast cancer Maternal Grandmother     Review of Systems: ROS Denies any recent chest pain, difficulty breathing, fevers, or leg swelling.  Physical Exam: Vital Signs BP 112/61 (BP Location: Left Arm, Patient Position: Sitting, Cuff Size: Normal)   Pulse 94   Ht 5' 6.5" (1.689 m)   Wt 141 lb (64 kg)   LMP 09/01/2010   SpO2 97%   BMI 22.42 kg/m   Physical Exam Constitutional:      General: Not in acute distress.    Appearance: Normal appearance. Not ill-appearing.  HENT:     Head: Normocephalic and atraumatic.  Eyes:     Pupils: Pupils are equal, round. Cardiovascular:     Rate and Rhythm: Normal rate.    Pulses: Normal pulses.  Pulmonary:     Effort: No respiratory distress or increased work of breathing.  Speaks in full sentences. Abdominal:     General: Abdomen is flat. No distension.   Musculoskeletal: Normal range of motion. No lower extremity swelling or edema. No varicosities. Skin:    General: Skin is warm and dry.     Findings: No erythema or rash.  Neurological:     Mental Status: Alert and oriented to person, place, and time.  Psychiatric:        Mood and Affect: Mood normal.        Behavior: Behavior normal.    Assessment/Plan: The patient is scheduled  for bilateral breast reduction with possible liposuction with Dr. Ulice Bold.  Risks, benefits, and alternatives of procedure discussed, questions answered and consent obtained.    Smoking Status: Non-smoker. Last Mammogram: 03/2021; Results: BI-RADS Category 1: Negative.  Caprini Score: 4; Risk Factors include: Age, hormone replacement medications, and length of planned surgery. Recommendation for mechanical prophylaxis. Encourage early ambulation.   Pictures obtained: 05/20/2021.  Post-op Rx sent to pharmacy: Keflex and Percocet 5-325 mg. Patient already has Zofran at home.  Patient was provided with the General Surgical Risk consent document and Pain Medication Agreement prior to their appointment.  They had adequate time to read through the risk consent documents and Pain Medication Agreement. We also discussed them in person together during this preop appointment. All of their questions were answered to their satisfaction.  Recommended calling if they have any further questions.  Risk consent form and Pain Medication Agreement to be scanned into patient's chart.  The risk that can be encountered with breast reduction were discussed and include the following but not limited to these:  Breast asymmetry, fluid accumulation, firmness of the breast, inability to breast feed, loss of nipple or areola, skin loss, decrease or no nipple sensation, fat necrosis of the breast tissue, bleeding, infection, healing delay.  There are risks of anesthesia, changes to skin sensation and injury to nerves or blood vessels.  The muscle can be temporarily or permanently injured.  You may have an allergic reaction to tape, suture, glue, blood products which can result in skin discoloration, swelling, pain, skin lesions, poor healing.  Any of these can lead to the need for revisonal surgery or stage procedures.  A reduction has potential to interfere with diagnostic procedures.  Nipple or breast piercing can increase  risks of infection.  This procedure is best done  when the breast is fully developed.  Changes in the breast will continue to occur over time.  Pregnancy can alter the outcomes of previous breast reduction surgery, weight gain and weigh loss can also effect the long term appearance.   The risks that can be encountered with and after liposuction were discussed and include the following but no limited to these:  Asymmetry, fluid accumulation, firmness of the area, fat necrosis with death of fat tissue, bleeding, infection, delayed healing, anesthesia risks, skin sensation changes, injury to structures including nerves, blood vessels, and muscles which may be temporary or permanent, allergies to tape, suture materials and glues, blood products, topical preparations or injected agents, skin and contour irregularities, skin discoloration and swelling, deep vein thrombosis, cardiac and pulmonary complications, pain, which may persist, persistent pain, recurrence of the lesion, poor healing of the incision, possible need for revisional surgery or staged procedures. Thiere can also be persistent swelling, poor wound healing, rippling or loose skin, worsening of cellulite, swelling, and thermal burn or heat injury from ultrasound with the ultrasound-assisted lipoplasty technique. Any change in weight fluctuations can alter the outcome.    Electronically signed by: Evelena LeydenGarrett Reiley Keisler, PA-C 10/10/2021 4:28 PM

## 2021-10-10 ENCOUNTER — Ambulatory Visit (INDEPENDENT_AMBULATORY_CARE_PROVIDER_SITE_OTHER): Payer: BC Managed Care – PPO | Admitting: Physician Assistant

## 2021-10-10 ENCOUNTER — Encounter: Payer: Self-pay | Admitting: Physician Assistant

## 2021-10-10 VITALS — BP 112/61 | HR 94 | Ht 66.5 in | Wt 141.0 lb

## 2021-10-10 DIAGNOSIS — N62 Hypertrophy of breast: Secondary | ICD-10-CM

## 2021-10-18 DIAGNOSIS — Z9012 Acquired absence of left breast and nipple: Secondary | ICD-10-CM | POA: Diagnosis not present

## 2021-10-20 ENCOUNTER — Telehealth: Payer: Self-pay | Admitting: *Deleted

## 2021-10-20 NOTE — Telephone Encounter (Signed)
Received on (10/18/21) via of fax DME Standard Written Order from [facility] requesting signature and return.  Given to provider to sign and return.    DME Standard Written Order signed and faxed back to  [facility].  Confirmation received and copy scanned into the chart.//AB/CMA

## 2021-10-24 NOTE — Progress Notes (Signed)
Surgical Instructions    Your procedure is scheduled on Wednesday, May 31st, 2023.   Report to [facility] Main Entrance "A" at 05:30 A.M., then check in with the Admitting office.  Call this number if you have problems the morning of surgery:  [phone]   If you have any questions prior to your surgery date call [phone]: Open Monday-Friday 8am-4pm    Remember:  Do not eat after midnight the night before your surgery  You may drink clear liquids until 04:30 the morning of your surgery.   Clear liquids allowed are: Water, Non-Citrus Juices (without pulp), Carbonated Beverages, Clear Tea, Black Coffee ONLY (NO MILK, CREAM OR POWDERED CREAMER of any kind), and Gatorade    Take these medicines the morning of surgery with A SIP OF WATER:   citalopram (CELEXA) metoprolol succinate (TOPROL-XL)   If needed:  albuterol (VENTOLIN HFA) ALPRAZolam (XANAX)  ondansetron (ZOFRAN-ODT)   As of today, STOP taking any Aspirin (unless otherwise instructed by your surgeon) Aleve, Naproxen, Ibuprofen, Motrin, Advil, Goody's, BC's, all herbal medications, fish oil, and all vitamins.    The day of surgery:          Do not wear jewelry or makeup Do not wear lotions, powders, perfumes, or deodorant. Do not shave 48 hours prior to surgery.   Do not bring valuables to the hospital. Do not wear nail polish, gel polish, artificial nails, or any other type of covering on natural nails (fingers and toes) If you have artificial nails or gel coating that need to be removed by a nail salon, please have this removed prior to surgery. Artificial nails or gel coating may interfere with anesthesia's ability to adequately monitor your vital signs.  [facility] is not responsible for any belongings or valuables. .   Do NOT Smoke (Tobacco/Vaping)  24 hours prior to your procedure  If you use a CPAP at night, you may bring your mask for your overnight stay.   Contacts, glasses, hearing aids, dentures or  partials may not be worn into surgery, please bring cases for these belongings   For patients admitted to the hospital, discharge time will be determined by your treatment team.   Patients discharged the day of surgery will not be allowed to drive home, and someone needs to stay with them for 24 hours.   SURGICAL WAITING ROOM VISITATION Patients having surgery or a procedure in a hospital may have two support people. Children under the age of 70 must have an adult with them who is not the patient. They may stay in the waiting area during the procedure and may switch out with other visitors. If the patient needs to stay at the hospital during part of their recovery, the visitor guidelines for inpatient rooms apply.  Please refer to the [facility] website for the visitor guidelines for Inpatients (after your surgery is over and you are in a regular room).    Special instructions:    Oral Hygiene is also important to reduce your risk of infection.  Remember - BRUSH YOUR TEETH THE MORNING OF SURGERY WITH YOUR REGULAR TOOTHPASTE   [place]- Preparing For Surgery  Before surgery, you can play an important role. Because skin is not sterile, your skin needs to be as free of germs as possible. You can reduce the number of germs on your skin by washing with CHG (chlorahexidine gluconate) Soap before surgery.  CHG is an antiseptic cleaner which kills germs and bonds with the skin to continue  killing germs even after washing.     Please do not use if you have an allergy to CHG or antibacterial soaps. If your skin becomes reddened/irritated stop using the CHG.  Do not shave (including legs and underarms) for at least 48 hours prior to first CHG shower. It is OK to shave your face.  Please follow these instructions carefully.     Shower the NIGHT BEFORE SURGERY and the MORNING OF SURGERY with CHG Soap.   If you chose to wash your hair, wash your hair first as usual with your normal shampoo.  After you shampoo, rinse your hair and body thoroughly to remove the shampoo.  Then [employer] and genitals (private parts) with your normal soap and rinse thoroughly to remove soap.  After that Use CHG Soap as you would any other liquid soap. You can apply CHG directly to the skin and wash gently with a scrungie or a clean washcloth.   Apply the CHG Soap to your body ONLY FROM THE NECK DOWN.  Do not use on open wounds or open sores. Avoid contact with your eyes, ears, mouth and genitals (private parts). Wash Face and genitals (private parts)  with your normal soap.   Wash thoroughly, paying special attention to the area where your surgery will be performed.  Thoroughly rinse your body with warm water from the neck down.  DO NOT shower/wash with your normal soap after using and rinsing off the CHG Soap.  Pat yourself dry with a CLEAN TOWEL.  Wear CLEAN PAJAMAS to bed the night before surgery  Place CLEAN SHEETS on your bed the night before your surgery  DO NOT SLEEP WITH PETS.   Day of Surgery:  Take a shower with CHG soap. Wear Clean/Comfortable clothing the morning of surgery Do not apply any deodorants/lotions.   Remember to brush your teeth WITH YOUR REGULAR TOOTHPASTE.    If you received a COVID test during your pre-op visit, it is requested that you wear a mask when out in public, stay away from anyone that may not be feeling well, and notify your surgeon if you develop symptoms. If you have been in contact with anyone that has tested positive in the last 10 days, please notify your surgeon.    Please read over the following fact sheets that you were given.

## 2021-10-25 ENCOUNTER — Other Ambulatory Visit: Payer: Self-pay

## 2021-10-25 ENCOUNTER — Encounter (HOSPITAL_COMMUNITY)
Admission: RE | Admit: 2021-10-25 | Discharge: 2021-10-25 | Disposition: A | Discharge status: Home / Self Care | Payer: BC Managed Care – PPO | Source: Ambulatory Visit | Attending: Plastic Surgery | Admitting: Plastic Surgery

## 2021-10-25 ENCOUNTER — Encounter (HOSPITAL_COMMUNITY): Payer: Self-pay

## 2021-10-25 DIAGNOSIS — Z01818 Encounter for other preprocedural examination: Secondary | ICD-10-CM | POA: Insufficient documentation

## 2021-10-25 HISTORY — DX: Personal history of urinary calculi: Z87.442

## 2021-10-25 NOTE — Progress Notes (Addendum)
Surgical Instructions    Your procedure is scheduled on Wednesday, May 31st, 2023.   Report to [facility] Main Entrance "A" at 05:30 A.M., then check in with the Admitting office.  Call this number if you have problems the morning of surgery:  [phone]   If you have any questions prior to your surgery date call [phone]: Open Monday-Friday 8am-4pm    Remember:  Do not eat or drink after midnight the night before your surgery     Take these medicines the morning of surgery with A SIP OF WATER:  citalopram (CELEXA) metoprolol succinate (TOPROL-XL)   If needed: metaxalone (SKELAXIN)  ALPRAZolam (XANAX)  rizatriptan (MAXALT-MLT)  ondansetron (ZOFRAN-ODT)   As of today, STOP taking any Aspirin (unless otherwise instructed by your surgeon) Aleve, Naproxen, Ibuprofen, Motrin, Advil, Goody's, BC's, all herbal medications, fish oil, and all vitamins.         Do not wear jewelry or makeup Do not wear lotions, powders, perfumes, or deodorant. Do not shave 48 hours prior to surgery.   Do not bring valuables to the hospital. Do not wear nail polish, gel polish, artificial nails, or any other type of covering on natural nails (fingers and toes) If you have artificial nails or gel coating that need to be removed by a nail salon, please have this removed prior to surgery. Artificial nails or gel coating may interfere with anesthesia's ability to adequately monitor your vital signs.  [employer] is not responsible for any belongings or valuables. .   Do NOT Smoke (Tobacco/Vaping)  24 hours prior to your procedure  If you use a CPAP at night, you may bring your mask for your overnight stay.   Contacts, glasses, hearing aids, dentures or partials may not be worn into surgery, please bring cases for these belongings   For patients admitted to the hospital, discharge time will be determined by your treatment team.   Patients discharged the day of surgery will not be allowed to drive  home, and someone needs to stay with them for 24 hours.   SURGICAL WAITING ROOM VISITATION Patients having surgery or a procedure in a hospital may have two support people. Children under the age of 56 must have an adult with them who is not the patient. They may stay in the waiting area during the procedure and may switch out with other visitors. If the patient needs to stay at the hospital during part of their recovery, the visitor guidelines for inpatient rooms apply.  Please refer to the [facility] website for the visitor guidelines for Inpatients (after your surgery is over and you are in a regular room).    Special instructions:    Oral Hygiene is also important to reduce your risk of infection.  Remember - BRUSH YOUR TEETH THE MORNING OF SURGERY WITH YOUR REGULAR TOOTHPASTE   [place]- Preparing For Surgery  Before surgery, you can play an important role. Because skin is not sterile, your skin needs to be as free of germs as possible. You can reduce the number of germs on your skin by washing with CHG (chlorahexidine gluconate) Soap before surgery.  CHG is an antiseptic cleaner which kills germs and bonds with the skin to continue killing germs even after washing.     Please do not use if you have an allergy to CHG or antibacterial soaps. If your skin becomes reddened/irritated stop using the CHG.  Do not shave (including legs and underarms) for at least 48 hours prior  to first CHG shower. It is OK to shave your face.  Please follow these instructions carefully.     Shower the NIGHT BEFORE SURGERY and the MORNING OF SURGERY with CHG Soap.   If you chose to wash your hair, wash your hair first as usual with your normal shampoo. After you shampoo, rinse your hair and body thoroughly to remove the shampoo.  Then [employer] and genitals (private parts) with your normal soap and rinse thoroughly to remove soap.  After that Use CHG Soap as you would any other liquid soap. You can  apply CHG directly to the skin and wash gently with a scrungie or a clean washcloth.   Apply the CHG Soap to your body ONLY FROM THE NECK DOWN.  Do not use on open wounds or open sores. Avoid contact with your eyes, ears, mouth and genitals (private parts). Wash Face and genitals (private parts)  with your normal soap.   Wash thoroughly, paying special attention to the area where your surgery will be performed.  Thoroughly rinse your body with warm water from the neck down.  DO NOT shower/wash with your normal soap after using and rinsing off the CHG Soap.  Pat yourself dry with a CLEAN TOWEL.  Wear CLEAN PAJAMAS to bed the night before surgery  Place CLEAN SHEETS on your bed the night before your surgery  DO NOT SLEEP WITH PETS.   Day of Surgery:  Take a shower with CHG soap. Wear Clean/Comfortable clothing the morning of surgery Do not apply any deodorants/lotions.   Remember to brush your teeth WITH YOUR REGULAR TOOTHPASTE.    If you received a COVID test during your pre-op visit, it is requested that you wear a mask when out in public, stay away from anyone that may not be feeling well, and notify your surgeon if you develop symptoms. If you have been in contact with anyone that has tested positive in the last 10 days, please notify your surgeon.    Please read over the following fact sheets that you were given.

## 2021-10-25 NOTE — Progress Notes (Signed)
No labs obtained during PAT appt. Pt's surgery may be cancelled or rescheduled d/t financial concerns. Labs to be obtained DOS (CBC/BMP) if surgery proceeds.   PCP - Dr. Tandy Gaw Cardiologist - Dr. Jens Som  PPM/ICD - n/a  Chest x-ray - n/a EKG - 12/15/20 Stress Test - denies ECHO - 09/26/16 Cardiac Cath - denies  Sleep Study - denies CPAP - denies  Blood Thinner Instructions: n/a Aspirin Instructions: n/a  NPO at MD  COVID TEST- n/a  Anesthesia review: No  Patient denies shortness of breath, fever, cough and chest pain at PAT appointment   All instructions explained to the patient, with a verbal understanding of the material. Patient agrees to go over the instructions while at home for a better understanding. Patient also instructed to self quarantine after being tested for COVID-19. The opportunity to ask questions was provided.

## 2021-11-01 NOTE — Anesthesia Preprocedure Evaluation (Addendum)
Anesthesia Evaluation  Patient identified by MRN, date of birth, ID band Patient awake    Reviewed: Allergy & Precautions, NPO status , Patient's Chart, lab work & pertinent test results, reviewed documented beta blocker date and time   History of Anesthesia Complications (+) PONV and history of anesthetic complications  Airway Mallampati: II  TM Distance: >3 FB Neck ROM: Full    Dental no notable dental hx. (+) Dental Advisory Given   Pulmonary neg pulmonary ROS,    Pulmonary exam normal        Cardiovascular negative cardio ROS Normal cardiovascular exam     Neuro/Psych  Headaches, PSYCHIATRIC DISORDERS Anxiety Depression    GI/Hepatic negative GI ROS, Neg liver ROS,   Endo/Other  Hypothyroidism   Renal/GU negative Renal ROS     Musculoskeletal negative musculoskeletal ROS (+)   Abdominal   Peds  Hematology negative hematology ROS (+)   Anesthesia Other Findings   Reproductive/Obstetrics                           Anesthesia Physical Anesthesia Plan  ASA: 2  Anesthesia Plan: General   Post-op Pain Management: Celebrex PO (pre-op)* and Tylenol PO (pre-op)*   Induction: Intravenous  PONV Risk Score and Plan: 4 or greater and Ondansetron, Dexamethasone, Midazolam, TIVA and Propofol infusion  Airway Management Planned: Oral ETT  Additional Equipment:   Intra-op Plan:   Post-operative Plan: Extubation in OR  Informed Consent: I have reviewed the patients History and Physical, chart, labs and discussed the procedure including the risks, benefits and alternatives for the proposed anesthesia with the patient or authorized representative who has indicated his/her understanding and acceptance.     Dental advisory given  Plan Discussed with: Anesthesiologist and CRNA  Anesthesia Plan Comments:       Anesthesia Quick Evaluation

## 2021-11-02 ENCOUNTER — Ambulatory Visit (HOSPITAL_COMMUNITY): Payer: BC Managed Care – PPO | Admitting: Anesthesiology

## 2021-11-02 ENCOUNTER — Telehealth: Payer: Self-pay | Admitting: *Deleted

## 2021-11-02 ENCOUNTER — Other Ambulatory Visit: Payer: Self-pay | Admitting: Student

## 2021-11-02 ENCOUNTER — Encounter (HOSPITAL_COMMUNITY): Payer: Self-pay | Admitting: Plastic Surgery

## 2021-11-02 ENCOUNTER — Other Ambulatory Visit: Payer: Self-pay

## 2021-11-02 ENCOUNTER — Encounter (HOSPITAL_COMMUNITY)
Admission: RE | Disposition: A | Discharge status: Home / Self Care | Payer: Self-pay | Source: Home / Self Care | Attending: Plastic Surgery

## 2021-11-02 ENCOUNTER — Ambulatory Visit (HOSPITAL_COMMUNITY)
Admission: RE | Admit: 2021-11-02 | Discharge: 2021-11-02 | Disposition: A | Discharge status: Home / Self Care | Payer: BC Managed Care – PPO | Attending: Plastic Surgery | Admitting: Plastic Surgery

## 2021-11-02 DIAGNOSIS — M542 Cervicalgia: Secondary | ICD-10-CM

## 2021-11-02 DIAGNOSIS — N6011 Diffuse cystic mastopathy of right breast: Secondary | ICD-10-CM | POA: Diagnosis not present

## 2021-11-02 DIAGNOSIS — N62 Hypertrophy of breast: Secondary | ICD-10-CM | POA: Diagnosis not present

## 2021-11-02 DIAGNOSIS — M549 Dorsalgia, unspecified: Secondary | ICD-10-CM

## 2021-11-02 DIAGNOSIS — N6012 Diffuse cystic mastopathy of left breast: Secondary | ICD-10-CM | POA: Diagnosis not present

## 2021-11-02 HISTORY — PX: BREAST REDUCTION SURGERY: SHX8

## 2021-11-02 LAB — CBC
HCT: 39.1 % (ref 36.0–46.0)
Hemoglobin: 13.5 g/dL (ref 12.0–15.0)
MCH: 30.9 pg (ref 26.0–34.0)
MCHC: 34.5 g/dL (ref 30.0–36.0)
MCV: 89.5 fL (ref 80.0–100.0)
Platelets: 221 10*3/uL (ref 150–400)
RBC: 4.37 MIL/uL (ref 3.87–5.11)
RDW: 12.1 % (ref 11.5–15.5)
WBC: 5.5 10*3/uL (ref 4.0–10.5)
nRBC: 0 % (ref 0.0–0.2)

## 2021-11-02 LAB — BASIC METABOLIC PANEL
Anion gap: 8 (ref 5–15)
BUN: 14 mg/dL (ref 6–20)
CO2: 23 mmol/L (ref 22–32)
Calcium: 9 mg/dL (ref 8.9–10.3)
Chloride: 107 mmol/L (ref 98–111)
Creatinine, Ser: 0.48 mg/dL (ref 0.44–1.00)
GFR, Estimated: >60 mL/min (ref >60)
Glucose, Bld: 101 mg/dL — ABNORMAL HIGH (ref 70–99)
Potassium: 4 mmol/L (ref 3.5–5.1)
Sodium: 138 mmol/L (ref 135–145)

## 2021-11-02 SURGERY — MAMMOPLASTY, REDUCTION
Anesthesia: General | Site: Breast | Laterality: Bilateral

## 2021-11-02 MED ORDER — BUPIVACAINE-EPINEPHRINE (PF) 0.25% -1:200000 IJ SOLN
INTRAMUSCULAR | Status: AC
  Filled 2021-11-02: qty 30

## 2021-11-02 MED ORDER — ROCURONIUM BROMIDE 10 MG/ML (PF) SYRINGE
PREFILLED_SYRINGE | INTRAVENOUS | Status: AC
  Filled 2021-11-02: qty 10

## 2021-11-02 MED ORDER — FENTANYL CITRATE (PF) 250 MCG/5ML IJ SOLN
INTRAMUSCULAR | Status: DC | PRN
Start: 2021-11-02 — End: 2021-11-02
  Administered 2021-11-02: 50 ug via INTRAVENOUS
  Administered 2021-11-02: 100 ug via INTRAVENOUS
  Administered 2021-11-02: 50 ug via INTRAVENOUS

## 2021-11-02 MED ORDER — DEXMEDETOMIDINE (PRECEDEX) IN NS 20 MCG/5ML (4 MCG/ML) IV SYRINGE
PREFILLED_SYRINGE | INTRAVENOUS | Status: DC | PRN
  Administered 2021-11-02: 12 ug via INTRAVENOUS

## 2021-11-02 MED ORDER — EPHEDRINE SULFATE-NACL 50-0.9 MG/10ML-% IV SOSY
PREFILLED_SYRINGE | INTRAVENOUS | Status: DC | PRN
  Administered 2021-11-02: 5 mg via INTRAVENOUS
  Administered 2021-11-02: 10 mg via INTRAVENOUS

## 2021-11-02 MED ORDER — SODIUM CHLORIDE 0.9 % IV SOLN
INTRAVENOUS | Status: DC | PRN
  Administered 2021-11-02: 500 mL

## 2021-11-02 MED ORDER — 0.9 % SODIUM CHLORIDE (POUR BTL) OPTIME
TOPICAL | Status: DC | PRN
  Administered 2021-11-02: 1000 mL

## 2021-11-02 MED ORDER — SUGAMMADEX SODIUM 200 MG/2ML IV SOLN
INTRAVENOUS | Status: DC | PRN
  Administered 2021-11-02: 200 mg via INTRAVENOUS

## 2021-11-02 MED ORDER — LIDOCAINE 2% (20 MG/ML) 5 ML SYRINGE
INTRAMUSCULAR | Status: DC | PRN
  Administered 2021-11-02: 100 mg via INTRAVENOUS

## 2021-11-02 MED ORDER — CELECOXIB 200 MG PO CAPS
200.0000 mg | ORAL_CAPSULE | Freq: Once | ORAL | Status: AC
  Administered 2021-11-02: 200 mg via ORAL
  Filled 2021-11-02: qty 1

## 2021-11-02 MED ORDER — ROCURONIUM BROMIDE 10 MG/ML (PF) SYRINGE
PREFILLED_SYRINGE | INTRAVENOUS | Status: DC | PRN
  Administered 2021-11-02: 20 mg via INTRAVENOUS
  Administered 2021-11-02: 100 mg via INTRAVENOUS

## 2021-11-02 MED ORDER — CHLORHEXIDINE GLUCONATE CLOTH 2 % EX PADS: 6.0000 | MEDICATED_PAD | Freq: Once | CUTANEOUS | Status: DC

## 2021-11-02 MED ORDER — OXYCODONE HCL 5 MG PO TABS: 5.0000 mg | ORAL_TABLET | Freq: Four times a day (QID) | ORAL | 0 refills | Status: DC | PRN

## 2021-11-02 MED ORDER — MIDAZOLAM HCL 2 MG/2ML IJ SOLN
INTRAMUSCULAR | Status: AC
  Filled 2021-11-02: qty 2

## 2021-11-02 MED ORDER — DEXAMETHASONE SODIUM PHOSPHATE 10 MG/ML IJ SOLN
INTRAMUSCULAR | Status: DC | PRN
  Administered 2021-11-02: 4 mg via INTRAVENOUS

## 2021-11-02 MED ORDER — CHLORHEXIDINE GLUCONATE 0.12 % MT SOLN
OROMUCOSAL | Status: AC
  Administered 2021-11-02: 15 mL
  Filled 2021-11-02: qty 15

## 2021-11-02 MED ORDER — FENTANYL CITRATE (PF) 250 MCG/5ML IJ SOLN
INTRAMUSCULAR | Status: AC
  Filled 2021-11-02: qty 5

## 2021-11-02 MED ORDER — LIDOCAINE HCL (PF) 1 % IJ SOLN
INTRAMUSCULAR | Status: AC
  Filled 2021-11-02: qty 30

## 2021-11-02 MED ORDER — ONDANSETRON HCL 4 MG/2ML IJ SOLN
INTRAMUSCULAR | Status: DC | PRN
  Administered 2021-11-02: 4 mg via INTRAVENOUS

## 2021-11-02 MED ORDER — PROPOFOL 10 MG/ML IV BOLUS
INTRAVENOUS | Status: DC | PRN
  Administered 2021-11-02: 170 mg via INTRAVENOUS

## 2021-11-02 MED ORDER — SODIUM CHLORIDE 0.9% FLUSH: 3.0000 mL | Freq: Two times a day (BID) | INTRAVENOUS | Status: DC

## 2021-11-02 MED ORDER — ACETAMINOPHEN 325 MG PO TABS: 650.0000 mg | ORAL_TABLET | ORAL | Status: DC | PRN

## 2021-11-02 MED ORDER — PROPOFOL 10 MG/ML IV BOLUS
INTRAVENOUS | Status: AC
  Filled 2021-11-02: qty 20

## 2021-11-02 MED ORDER — SODIUM CHLORIDE 0.9 % IV SOLN: 250.0000 mL | INTRAVENOUS | Status: DC | PRN

## 2021-11-02 MED ORDER — ACETAMINOPHEN 650 MG RE SUPP: 650.0000 mg | RECTAL | Status: DC | PRN

## 2021-11-02 MED ORDER — SODIUM CHLORIDE 0.9% FLUSH: 3.0000 mL | INTRAVENOUS | Status: DC | PRN

## 2021-11-02 MED ORDER — LIDOCAINE HCL 1 % IJ SOLN
INTRAMUSCULAR | Status: DC | PRN
  Administered 2021-11-02: 30 mL

## 2021-11-02 MED ORDER — SCOPOLAMINE 1 MG/3DAYS TD PT72
1.0000 | MEDICATED_PATCH | TRANSDERMAL | Status: DC
  Administered 2021-11-02: 1.5 mg via TRANSDERMAL
  Filled 2021-11-02: qty 1

## 2021-11-02 MED ORDER — LIDOCAINE 2% (20 MG/ML) 5 ML SYRINGE
INTRAMUSCULAR | Status: AC
  Filled 2021-11-02: qty 5

## 2021-11-02 MED ORDER — FENTANYL CITRATE (PF) 100 MCG/2ML IJ SOLN: 25.0000 ug | INTRAMUSCULAR | Status: DC | PRN

## 2021-11-02 MED ORDER — PROPOFOL 500 MG/50ML IV EMUL
INTRAVENOUS | Status: DC | PRN
  Administered 2021-11-02: 100 ug/kg/min via INTRAVENOUS
  Administered 2021-11-02: 115 ug/kg/min via INTRAVENOUS

## 2021-11-02 MED ORDER — EPHEDRINE 5 MG/ML INJ
INTRAVENOUS | Status: AC
  Filled 2021-11-02: qty 5

## 2021-11-02 MED ORDER — AMISULPRIDE (ANTIEMETIC) 5 MG/2ML IV SOLN
INTRAVENOUS | Status: AC
  Filled 2021-11-02: qty 4

## 2021-11-02 MED ORDER — MIDAZOLAM HCL 2 MG/2ML IJ SOLN
INTRAMUSCULAR | Status: DC | PRN
  Administered 2021-11-02: 2 mg via INTRAVENOUS

## 2021-11-02 MED ORDER — PROMETHAZINE HCL 25 MG/ML IJ SOLN: 6.2500 mg | INTRAMUSCULAR | Status: DC | PRN

## 2021-11-02 MED ORDER — OXYCODONE HCL 5 MG PO TABS: 5.0000 mg | ORAL_TABLET | ORAL | Status: DC | PRN

## 2021-11-02 MED ORDER — CEFAZOLIN SODIUM-DEXTROSE 2-4 GM/100ML-% IV SOLN
2.0000 g | INTRAVENOUS | Status: AC
  Administered 2021-11-02: 2 g via INTRAVENOUS
  Filled 2021-11-02: qty 100

## 2021-11-02 MED ORDER — BUPIVACAINE-EPINEPHRINE 0.25% -1:200000 IJ SOLN
INTRAMUSCULAR | Status: DC | PRN
  Administered 2021-11-02: 30 mL

## 2021-11-02 MED ORDER — LACTATED RINGERS IV SOLN: INTRAVENOUS | Status: DC | PRN

## 2021-11-02 MED ORDER — CEPHALEXIN 500 MG PO CAPS
500.0000 mg | ORAL_CAPSULE | Freq: Four times a day (QID) | ORAL | 0 refills | Status: AC
Start: 2021-11-02 — End: 2021-11-05

## 2021-11-02 MED ORDER — AMISULPRIDE (ANTIEMETIC) 5 MG/2ML IV SOLN
10.0000 mg | Freq: Once | INTRAVENOUS | Status: AC | PRN
  Administered 2021-11-02: 10 mg via INTRAVENOUS

## 2021-11-02 MED ORDER — DEXAMETHASONE SODIUM PHOSPHATE 10 MG/ML IJ SOLN
INTRAMUSCULAR | Status: AC
  Filled 2021-11-02: qty 1

## 2021-11-02 MED ORDER — ACETAMINOPHEN 500 MG PO TABS
1000.0000 mg | ORAL_TABLET | Freq: Once | ORAL | Status: AC
Start: 2021-11-02 — End: 2021-11-02
  Administered 2021-11-02: 1000 mg via ORAL
  Filled 2021-11-02: qty 2

## 2021-11-02 MED ORDER — ONDANSETRON HCL 4 MG/2ML IJ SOLN
INTRAMUSCULAR | Status: AC
  Filled 2021-11-02: qty 2

## 2021-11-02 SURGICAL SUPPLY — 59 items
ADH SKN CLS APL DERMABOND .7 (GAUZE/BANDAGES/DRESSINGS) ×2
APL PRP STRL LF DISP 70% ISPRP (MISCELLANEOUS) ×1
BAG COUNTER SPONGE SURGICOUNT (BAG) ×2 IMPLANT
BAG DECANTER FOR FLEXI CONT (MISCELLANEOUS) ×2 IMPLANT
BAG SPNG CNTER NS LX DISP (BAG) ×1
BINDER BREAST LRG (GAUZE/BANDAGES/DRESSINGS) IMPLANT
BINDER BREAST MEDIUM (GAUZE/BANDAGES/DRESSINGS) IMPLANT
BINDER BREAST XLRG (GAUZE/BANDAGES/DRESSINGS) IMPLANT
BINDER BREAST XXLRG (GAUZE/BANDAGES/DRESSINGS) IMPLANT
BIOPATCH RED 1 DISK 7.0 (GAUZE/BANDAGES/DRESSINGS) IMPLANT
BLADE SURG 10 STRL SS (BLADE) ×2 IMPLANT
BNDG GAUZE ELAST 4 BULKY (GAUZE/BANDAGES/DRESSINGS) IMPLANT
CANISTER SUCT 3000ML PPV (MISCELLANEOUS) ×2 IMPLANT
CHLORAPREP W/TINT 26 (MISCELLANEOUS) ×2 IMPLANT
CLSR STERI-STRIP ANTIMIC 1/2X4 (GAUZE/BANDAGES/DRESSINGS) ×4 IMPLANT
DECANTER SPIKE VIAL GLASS SM (MISCELLANEOUS) IMPLANT
DERMABOND ADVANCED (GAUZE/BANDAGES/DRESSINGS) ×2
DERMABOND ADVANCED .7 DNX12 (GAUZE/BANDAGES/DRESSINGS) ×2 IMPLANT
DRAIN CHANNEL 19F RND (DRAIN) IMPLANT
DRAPE LAPAROSCOPIC ABDOMINAL (DRAPES) ×2 IMPLANT
DRAPE ORTHO SPLIT 77X108 STRL (DRAPES)
DRAPE SURG ORHT 6 SPLT 77X108 (DRAPES) IMPLANT
DRSG OPSITE POSTOP 4X8 (GAUZE/BANDAGES/DRESSINGS) ×2 IMPLANT
DRSG PAD ABDOMINAL 8X10 ST (GAUZE/BANDAGES/DRESSINGS) ×4 IMPLANT
ELECT BLADE 4.0 EZ CLEAN MEGAD (MISCELLANEOUS) ×2
ELECT CAUTERY BLADE 6.4 (BLADE) ×2 IMPLANT
ELECT REM PT RETURN 9FT ADLT (ELECTROSURGICAL) ×2
ELECTRODE BLDE 4.0 EZ CLN MEGD (MISCELLANEOUS) ×1 IMPLANT
ELECTRODE REM PT RTRN 9FT ADLT (ELECTROSURGICAL) ×1 IMPLANT
EVACUATOR SILICONE 100CC (DRAIN) IMPLANT
GLOVE BIO SURGEON STRL SZ 6.5 (GLOVE) ×4 IMPLANT
GLOVE SURG ENC TEXT LTX SZ7.5 (GLOVE) ×2 IMPLANT
GOWN STRL REUS W/ TWL LRG LVL3 (GOWN DISPOSABLE) ×2 IMPLANT
GOWN STRL REUS W/TWL LRG LVL3 (GOWN DISPOSABLE) ×4
MARKER SKIN DUAL TIP RULER LAB (MISCELLANEOUS) ×2 IMPLANT
NDL HYPO 25GX1X1/2 BEV (NEEDLE) ×1 IMPLANT
NDL SPNL 22GX3.5 QUINCKE BK (NEEDLE) IMPLANT
NEEDLE HYPO 25GX1X1/2 BEV (NEEDLE) ×2 IMPLANT
NEEDLE SPNL 22GX3.5 QUINCKE BK (NEEDLE) IMPLANT
NS IRRIG 1000ML POUR BTL (IV SOLUTION) ×2 IMPLANT
PACK GENERAL/GYN (CUSTOM PROCEDURE TRAY) ×2 IMPLANT
SPONGE T-LAP 18X18 ~~LOC~~+RFID (SPONGE) ×4 IMPLANT
STAPLER VISISTAT 35W (STAPLE) IMPLANT
STRIP CLOSURE SKIN 1/2X4 (GAUZE/BANDAGES/DRESSINGS) ×4 IMPLANT
SUT MNCRL AB 3-0 PS2 27 (SUTURE) ×5 IMPLANT
SUT MON AB 3-0 SH 27 (SUTURE)
SUT MON AB 3-0 SH27 (SUTURE) IMPLANT
SUT MON AB 4-0 PS1 27 (SUTURE) ×4 IMPLANT
SUT MON AB 5-0 PS2 18 (SUTURE) IMPLANT
SUT PDS AB 2-0 CT2 27 (SUTURE) IMPLANT
SUT PDS AB 3-0 SH 27 (SUTURE) ×5 IMPLANT
SUT SILK 3 0 PS 1 (SUTURE) IMPLANT
SUT VIC AB 3-0 SH 27 (SUTURE)
SUT VIC AB 3-0 SH 27X BRD (SUTURE) IMPLANT
SUT VICRYL 4-0 PS2 18IN ABS (SUTURE) IMPLANT
SYR 50ML LL SCALE MARK (SYRINGE) IMPLANT
SYR CONTROL 10ML LL (SYRINGE) ×2 IMPLANT
TOWEL GREEN STERILE FF (TOWEL DISPOSABLE) ×4 IMPLANT
UNDERPAD 30X36 HEAVY ABSORB (UNDERPADS AND DIAPERS) ×4 IMPLANT

## 2021-11-02 NOTE — Transfer of Care (Signed)
Immediate Anesthesia Transfer of Care Note  Patient: Sydney Bailey  Procedure(s) Performed: MAMMARY REDUCTION  (BREAST) WITH LIPOSUCTION (Bilateral: Breast)  Patient Location: PACU  Anesthesia Type:General  Level of Consciousness: drowsy, patient cooperative and responds to stimulation  Airway & Oxygen Therapy: Patient Spontanous Breathing  Post-op Assessment: Report given to RN and Post -op Vital signs reviewed and stable  Post vital signs: Reviewed and stable  Last Vitals:  Vitals Value Taken Time  BP 112/80 11/02/21 1010  Temp    Pulse 82 11/02/21 1011  Resp 14 11/02/21 1011  SpO2 93 % 11/02/21 1011  Vitals shown include unvalidated device data.  Last Pain:  Vitals:   11/02/21 0612  TempSrc: Oral  PainSc:       Patients Stated Pain Goal: 0 (11/02/21 0610)  Complications: No notable events documented.

## 2021-11-02 NOTE — Anesthesia Procedure Notes (Signed)
Procedure Name: Intubation Date/Time: 11/02/2021 7:34 AM Performed by: Janace Litten, CRNA Pre-anesthesia Checklist: Patient identified, Emergency Drugs available, Suction available and Patient being monitored Patient Re-evaluated:Patient Re-evaluated prior to induction Oxygen Delivery Method: Circle System Utilized Preoxygenation: Pre-oxygenation with 100% oxygen Induction Type: IV induction Ventilation: Mask ventilation without difficulty Laryngoscope Size: Mac and 3 Grade View: Grade I Tube type: Oral Tube size: 7.0 mm Number of attempts: 1 Airway Equipment and Method: Stylet Placement Confirmation: ETT inserted through vocal cords under direct vision, positive ETCO2 and breath sounds checked- equal and bilateral Secured at: 22 cm Tube secured with: Tape Dental Injury: Teeth and Oropharynx as per pre-operative assessment

## 2021-11-02 NOTE — Discharge Instructions (Signed)
INSTRUCTIONS FOR AFTER BREAST SURGERY   You will likely have some questions about what to expect following your operation.  The following information will help you and your family understand what to expect when you are discharged from the hospital.  Following these guidelines will help ensure a smooth recovery and reduce risks of complications.  Postoperative instructions include information on: diet, wound care, medications and physical activity.  AFTER SURGERY Expect to go home after the procedure.  In some cases, you may need to spend one night in the hospital for observation.  DIET Breast surgery does not require a specific diet.  However, the healthier you eat the better your body can start healing. It is important to increasing your protein intake.  This means limiting the foods with sugar and carbohydrates.  Focus on vegetables and some meat.  If you have any liposuction during your procedure be sure to drink water.  If your urine is bright yellow, then it is concentrated, and you need to drink more water.  As a general rule after surgery, you should have 8 ounces of water every hour while awake.  If you find you are persistently nauseated or unable to take in liquids let us know.  NO TOBACCO USE or EXPOSURE.  This will slow your healing process and increase the risk of a wound.  WOUND CARE Leave the ACE wrap or binder on for 3 days . Use fragrance free soap.   After 3 days you can remove the ACE wrap or binder to shower. Once dry apply ACE wrap, binder or sports bra.  Use a mild soap like Dial, Dove and Ivory. You may have Topifoam or Lipofoam on.  It is soft and spongy and helps keep you from getting creases if you have liposuction.  This can be removed before the shower and then replaced.  If you need more it is available on [employer] (Lipofoam). If you have steri-strips / tape directly attached to your skin leave them in place. It is OK to get these wet.   No baths, pools or hot tubs for four  weeks. We close your incision to leave the smallest and best-looking scar. No ointment or creams on your incisions until given the go ahead.  Especially not Neosporin (Too many skin reactions with this one).  A few weeks after surgery you can use Mederma and start massaging the scar. We ask you to wear your binder or sports bra for the first 6 weeks around the clock, including while sleeping. This provides added comfort and helps reduce the fluid accumulation at the surgery site.  ACTIVITY No heavy lifting until cleared by the doctor.  This usually means no more than a half-gallon of milk.  It is OK to walk and climb stairs. In fact, moving your legs is very important to decrease your risk of a blood clot.  It will also help keep you from getting deconditioned.  Every 1 to 2 hours get up and walk for 5 minutes. This will help with a quicker recovery back to normal.  Let pain be your guide so you don't do too much.  This is not the time for spring cleaning and don't plan on taking care of anyone else.  This time is for you to recover,  You will be more comfortable if you sleep and rest with your head elevated either with a few pillows under you or in a recliner.  No stomach sleeping for a three months.  WORK Everyone   returns to work at different times. As a rough guide, most people take at least 1 - 2 weeks off prior to returning to work. If you need documentation for your job, bring the forms to your postoperative follow up visit.  DRIVING Arrange for someone to bring you home from the hospital.  You may be able to drive a few days after surgery but not while taking any narcotics or valium.  BOWEL MOVEMENTS Constipation can occur after anesthesia and while taking pain medication.  It is important to stay ahead for your comfort.  We recommend taking Milk of Magnesia (2 tablespoons; twice a day) while taking the pain pills.  MEDICATIONS You may be prescribed should start after surgery At your  preoperative visit for you history and physical you may have been given the following medications: An antibiotic: Start this medication when you get home and take according to the instructions on the bottle. Zofran 4 mg:  This is to treat nausea and vomiting.  You can take this every 6 hours as needed and only if needed. Valium 2 mg: This is for muscle tightness if you have an implant or expander. This will help relax your muscle which also helps with pain control.  This can be taken every 12 hours as needed. Don't drive after taking this medication. Norco (hydrocodone/acetaminophen) 5/325 mg:  This is only to be used after you have taken the motrin or the tylenol. Every 8 hours as needed.   Over the counter Medication to take: Ibuprofen (Motrin) 600 mg:  Take this every 6 hours.  If you have additional pain then take 500 mg of the tylenol every 8 hours.  Only take the Norco after you have tried these two. Miralax or stool softener of choice: Take this according to the bottle if you take the Norco.  WHEN TO CALL Call your surgeon's office if any of the following occur: Fever 101 degrees F or greater Excessive bleeding or fluid from the incision site. Pain that increases over time without aid from the medications Redness, warmth, or pus draining from incision sites Persistent nausea or inability to take in liquids Severe misshapen area that underwent the operation. 

## 2021-11-02 NOTE — Telephone Encounter (Signed)
Received call from pt's husband Tom. States pharmacy did not have Rx for post-op antibiotic and pain medication today when he went to pick them up after pt's surgery. Caroline More, PA-C notified and states will send them in to [facility] in [facility]. Pt's husband made aware.

## 2021-11-02 NOTE — Anesthesia Postprocedure Evaluation (Signed)
Anesthesia Post Note  Patient: Sydney Bailey  Procedure(s) Performed: MAMMARY REDUCTION  (BREAST) WITH LIPOSUCTION (Bilateral: Breast)     Patient location during evaluation: PACU Anesthesia Type: General Level of consciousness: sedated Pain management: pain level controlled Vital Signs Assessment: post-procedure vital signs reviewed and stable Respiratory status: spontaneous breathing and respiratory function stable Cardiovascular status: stable Postop Assessment: no apparent nausea or vomiting Anesthetic complications: no   No notable events documented.  Last Vitals:  Vitals:   11/02/21 1025 11/02/21 1040  BP: 121/62 117/77  Pulse: 80 68  Resp: 16 13  Temp:  (!) 36.3 C  SpO2: 96% 95%    Last Pain:  Vitals:   11/02/21 1040  TempSrc:   PainSc: 0-No pain                 Josephene Marrone DANIEL

## 2021-11-02 NOTE — Interval H&P Note (Signed)
History and Physical Interval Note:  11/02/2021 7:09 AM  Sydney Bailey  has presented today for surgery, with the diagnosis of Symptomatic mammary hypertrophy.  The various methods of treatment have been discussed with the patient and family. After consideration of risks, benefits and other options for treatment, the patient has consented to  Procedure(s) with comments: MAMMARY REDUCTION  (BREAST) (Bilateral) - 3 hours as a surgical intervention.  The patient's history has been reviewed, patient examined, no change in status, stable for surgery.  I have reviewed the patient's chart and labs.  Questions were answered to the patient's satisfaction.     Alena Bills Sydney Bailey

## 2021-11-02 NOTE — Op Note (Signed)
Breast Reduction Op note:    DATE OF PROCEDURE: 11/02/2021  LOCATION: [facility]  SURGEON: Alan Ripper Sanger Leomia Blake, DO  ASSISTANT: Caroline More, PA  PREOPERATIVE DIAGNOSIS 1. Macromastia 2. Neck Pain 3. Back Pain  POSTOPERATIVE DIAGNOSIS 1. Macromastia 2. Neck Pain 3. Back Pain  PROCEDURES 1. Bilateral breast reduction.  Right reduction 400 g, Left reduction 410 g  COMPLICATIONS: None.  DRAINS: none  INDICATIONS FOR PROCEDURE Sydney Bailey is a 56 y.o. year-old female born on Feb 20, 1966,with a history of symptomatic macromastia with concominant back pain, neck pain, shoulder grooving from her bra.   MRN: [id number]  CONSENT Informed consent was obtained directly from the patient. The risks, benefits and alternatives were fully discussed. Specific risks including but not limited to bleeding, infection, hematoma, seroma, scarring, pain, nipple necrosis, asymmetry, poor cosmetic results, and need for further surgery were discussed. The patient had ample opportunity to have her questions answered to her satisfaction.  DESCRIPTION OF PROCEDURE  Patient was brought into the operating room and placed in a supine position.  SCDs were placed and appropriate padding was performed.  Antibiotics were given. The patient underwent general anesthesia and the chest was prepped and draped in a sterile fashion.  A timeout was performed and all information was confirmed to be correct.  Right side: Preoperative markings were confirmed.  Incision lines were injected with local with epinephrine.  After waiting for vasoconstriction, the marked lines were incised.  A Wise-pattern superomedial breast reduction was performed by de-epithelializing the pedicle, using bovie to create the superomedial pedicle, and removing breast tissue from the superior, lateral, and inferior portions of the breast.  Care was taken to not undermine the breast pedicle. Hemostasis was achieved.  The  nipple was gently rotated into position and the soft tissue closed with 4-0 Monocryl.   The pocket was irrigated and hemostasis confirmed.  The deep tissues were approximated with 3-0 PDS and 3-0 Monocryl sutures and the skin was closed with deep dermal and subcuticular 4-0 Monocryl sutures.  The nipple and skin flaps had good capillary refill at the end of the procedure.    Left side: Preoperative markings were confirmed.  Incision lines were injected with local with epinephrine.  After waiting for vasoconstriction, the marked lines were incised.  A Wise-pattern superomedial breast reduction was performed by de-epithelializing the pedicle, using bovie to create the superomedial pedicle, and removing breast tissue from the superior, lateral, and inferior portions of the breast.  Care was taken to not undermine the breast pedicle. Hemostasis was achieved.  The nipple was gently rotated into position and the soft tissue was closed with 4-0 Monocryl.  The patient was sat upright and size and shape symmetry was confirmed.  The pocket was irrigated and hemostasis confirmed.  The deep tissues were approximated with 3-0 PDS and 3-0 Monocryl sutures and the skin was closed with deep dermal and subcuticular 4-0 Monocryl sutures.  Dermabond was applied.  A breast binder and ABDs were placed.  The nipple and skin flaps had good capillary refill at the end of the procedure.  The patient tolerated the procedure well. The patient was allowed to wake from anesthesia and taken to the recovery room in satisfactory condition.  The advanced practice practitioner (APP) assisted throughout the case.  The APP was essential in retraction and counter traction when needed to make the case progress smoothly.  This retraction and assistance made it possible to see the tissue plans for the procedure.  The  assistance was needed for blood control, tissue re-approximation and assisted with closure of the incision site.

## 2021-11-03 ENCOUNTER — Other Ambulatory Visit: Payer: Self-pay | Admitting: Student

## 2021-11-03 ENCOUNTER — Telehealth: Payer: Self-pay | Admitting: Student

## 2021-11-03 ENCOUNTER — Encounter: Payer: Self-pay | Admitting: Plastic Surgery

## 2021-11-03 ENCOUNTER — Encounter (HOSPITAL_COMMUNITY): Payer: Self-pay | Admitting: Plastic Surgery

## 2021-11-03 ENCOUNTER — Telehealth: Payer: Self-pay

## 2021-11-03 MED ORDER — TRAMADOL HCL 50 MG PO TABS: 50.0000 mg | ORAL_TABLET | Freq: Three times a day (TID) | ORAL | 0 refills | Status: DC | PRN

## 2021-11-03 NOTE — Telephone Encounter (Signed)
Pt's husband called in stating he refused the oxycodone. States due to gallbladder the oxycodone does not work for her. Husband gave her one of his hydromorphone he had left over from a surgery of his. Wants to see if Dr. Ulice Bold will call in the Dilaudid instead.

## 2021-11-03 NOTE — Telephone Encounter (Signed)
Correction-the Dilaudid was patient's left over medication from November's surgery.

## 2021-11-03 NOTE — Telephone Encounter (Signed)
Patient messaged on MyChart with questions regarding surgery and her husband called the clinic with questions regarding her pain medication.  I called the patient to answer some of her questions.    Patient states that she thinks she is taking oxycodone in the past for her gallbladder surgery and it made her "feel weird".  She states she has taken Dilaudid she had leftover from her previous prescription which she reports helps.  She also states she has some tramadol leftover from a previous prescription which she feels helps also. She states she has 11 pills left of the tramadol.   Discussed with patient that she can that she can alternate between tylenol and ibuprofen for pain control. Discussed with patient that she can continue to the tramadol as needed for pain if the tylenol and ibuprofen are not sufficient. Discussed that I will send her more tramadol if she needs to have a total of 5 days worth of tramadol. Discussed she should take tramadol, tylenol and ibuprofen for pain control. Patient agreed.   Discussed with patient that she may shower 3 days postoperatively and to keep compressive sports bra on at all times except for showering. Told patient that if water were to get underneath honeycomb dressings that she can take these off. Patient acknowledged.   Told patient to call us if she has any questions or concerns.   Called [facility] pharmacy and cancelled order from oxycodone from yesterday as patient did not pick up. Confirmed this on patient's PDMP.

## 2021-11-04 LAB — SURGICAL PATHOLOGY

## 2021-11-11 ENCOUNTER — Ambulatory Visit (INDEPENDENT_AMBULATORY_CARE_PROVIDER_SITE_OTHER): Payer: BC Managed Care – PPO | Admitting: Plastic Surgery

## 2021-11-11 ENCOUNTER — Encounter: Payer: Self-pay | Admitting: Plastic Surgery

## 2021-11-11 DIAGNOSIS — N62 Hypertrophy of breast: Secondary | ICD-10-CM

## 2021-11-11 NOTE — Progress Notes (Signed)
The patient is a 56 year old female here for follow-up after undergoing a bilateral breast reduction on May 31.  She had over 400 g removed from each breast.  Overall she is doing really well.  She does have some swelling which is normal and I think that is going to take some time to settle out.  We will plan on getting a picture next visit.  Her dressings can be removed if they get wet otherwise we will take him off at her next visit

## 2021-11-28 NOTE — Progress Notes (Deleted)
Patient is a 56 year old female with history of symptomatic macromastia.  Patient underwent bilateral breast reduction with Dr. Ulice Bold on 11/02/2021.  400 g were removed from the right breast and 410 g were removed from the left breast.  Patient presents today for postoperative follow-up.  Patient was last seen in the clinic on 11/11/2021.  Overall she was doing well.  On exam there was some swelling which was normal.  Today,

## 2021-11-29 ENCOUNTER — Ambulatory Visit (INDEPENDENT_AMBULATORY_CARE_PROVIDER_SITE_OTHER): Payer: BC Managed Care – PPO | Admitting: Physician Assistant

## 2021-11-29 DIAGNOSIS — Z9889 Other specified postprocedural states: Secondary | ICD-10-CM

## 2021-11-29 NOTE — Progress Notes (Signed)
Patient is a pleasant 56 year old female with PMH of macromastia s/p bilateral breast reduction performed 11/02/2021 by Dr. Ulice Bold who presents to clinic for postoperative follow-up.  She was last seen here in clinic on 11/11/2021.  Swelling was noted on exam, but it was anticipated that it would settle out.  Plan was for pictures at subsequent encounter.  Today, patient tells me that she is doing well from a recovery standpoint.  She had difficulty sleeping early on in her postoperative recovery as the tramadol seemed to keep her up at night.  However, she has transitioned to Advil PM and reports that her pain symptoms have largely resolved.  She is eager to have her Steri-Strips removed.  She also reports that her right breast appears considerably larger than her left.  Patient was hoping that it would have improved as the swelling went down, but she now feels as though there is a mismatch.  She feels that the left nipple is also positioned lower on the breast than on the contralateral side.  On physical exam, the right breast does appear to be slightly larger in size, particularly inferiorly.  Nipple to fold is slightly longer.  No obvious seroma or hematoma.  No erythema.  Steri-Strips are removed without complication or difficulty.  No wounds.  Mild residual Dermabond around NAC's bilaterally, but they are otherwise viable and healing nicely.  From a postoperative standpoint, she has healed quite nicely.  May resume normal activity after another 2 weeks.  Continued compressive garments until then.  Discussed Vaseline around NAC to help with residual Dermabond.  Afterwards, she can transition to scar mitigation creams.  She has a friend who is an Public librarian and will discuss with her, but recommendation was for anything silicone-based.  Advised her to consult with Dr. Ulice Bold 6 months postop from her reduction to discuss options if she is still bothered by asymmetry and if it fails to improve with  time.  However, no additional postop is needed as she is healing quite nicely.  Picture(s) obtained of the patient and placed in the chart were with the patient's or guardian's permission.

## 2021-12-05 NOTE — Telephone Encounter (Signed)
Reviewed the note from last visit.  Called to encourage the patient. Left message.

## 2021-12-27 ENCOUNTER — Encounter: Payer: Self-pay | Admitting: Plastic Surgery

## 2021-12-27 ENCOUNTER — Encounter: Payer: Self-pay | Admitting: Physician Assistant

## 2021-12-27 DIAGNOSIS — R7989 Other specified abnormal findings of blood chemistry: Secondary | ICD-10-CM

## 2021-12-27 DIAGNOSIS — Z780 Asymptomatic menopausal state: Secondary | ICD-10-CM

## 2021-12-27 MED ORDER — AMBULATORY NON FORMULARY MEDICATION: 1 refills | Status: DC

## 2021-12-27 MED ORDER — NONFORMULARY OR COMPOUNDED ITEM: 8.0000 mg | Freq: Every day | 3 refills | Status: DC

## 2022-01-28 ENCOUNTER — Other Ambulatory Visit: Payer: Self-pay | Admitting: Cardiology

## 2022-01-28 DIAGNOSIS — R002 Palpitations: Secondary | ICD-10-CM

## 2022-02-27 ENCOUNTER — Other Ambulatory Visit: Payer: Self-pay | Admitting: Physician Assistant

## 2022-02-27 ENCOUNTER — Other Ambulatory Visit: Payer: Self-pay | Admitting: Cardiology

## 2022-02-27 DIAGNOSIS — R002 Palpitations: Secondary | ICD-10-CM

## 2022-02-27 DIAGNOSIS — G43009 Migraine without aura, not intractable, without status migrainosus: Secondary | ICD-10-CM

## 2022-03-02 ENCOUNTER — Other Ambulatory Visit: Payer: Self-pay | Admitting: Physician Assistant

## 2022-03-02 DIAGNOSIS — G43009 Migraine without aura, not intractable, without status migrainosus: Secondary | ICD-10-CM

## 2022-03-24 ENCOUNTER — Ambulatory Visit (INDEPENDENT_AMBULATORY_CARE_PROVIDER_SITE_OTHER): Payer: BC Managed Care – PPO | Admitting: Plastic Surgery

## 2022-03-24 DIAGNOSIS — N6489 Other specified disorders of breast: Secondary | ICD-10-CM | POA: Diagnosis not present

## 2022-03-24 NOTE — Progress Notes (Signed)
   Subjective:    Patient ID: Sydney Bailey, female    DOB: 02-06-1966, 56 y.o.   MRN: [id number]  The patient is a 56 year old female here for follow-up undergoing a breast reduction in May she had over 400 g removed.  She feels like the right side is a little fuller than the left side.  She would like to know if there is anything she could do to have a little more symmetry.  She would like the medial aspect of the right excess breast tissue removed.      Review of Systems  Constitutional: Negative.   Eyes: Negative.   Respiratory: Negative.    Cardiovascular: Negative.   Gastrointestinal: Negative.   Endocrine: Negative.   Genitourinary: Negative.   Musculoskeletal: Negative.   Psychiatric/Behavioral: Negative.         Objective:   Physical Exam Constitutional:      Appearance: Normal appearance.  HENT:     Head: Atraumatic.  Cardiovascular:     Rate and Rhythm: Normal rate.     Pulses: Normal pulses.  Pulmonary:     Effort: Pulmonary effort is normal.  Neurological:     Mental Status: She is alert and oriented to person, place, and time.  Psychiatric:        Mood and Affect: Mood normal.        Behavior: Behavior normal.        Thought Content: Thought content normal.        Judgment: Judgment normal.         Assessment & Plan:     ICD-10-CM   1. Breast asymmetry  N64.89        Continue with massage and Mederma or silicone sheets.  We will see if we can do excision of right breast medially.  Pictures were obtained of the patient and placed in the chart with the patient's or guardian's permission.

## 2022-04-14 ENCOUNTER — Telehealth: Payer: Self-pay | Admitting: Plastic Surgery

## 2022-04-14 NOTE — Telephone Encounter (Signed)
LVM for pt to contatct the office to schedule an appt with Dr. Ulice Bold.

## 2022-04-17 ENCOUNTER — Telehealth: Payer: Self-pay | Admitting: *Deleted

## 2022-04-17 NOTE — Telephone Encounter (Signed)
Case pending for CPT 19318, [id number] for in office procedure  Pend: [id number]

## 2022-04-26 NOTE — Progress Notes (Signed)
HPI: FU palpitations. Echo 4/18 showed normal LV function. Holter 4/18 showed sinus brady, sinus, sinus tach, PVCs and rare couplet. Since last seen, she denies dyspnea, chest pain or syncope.  Palpitations are reasonly well-controlled.  Current Outpatient Medications  Medication Sig Dispense Refill   ALPRAZolam (XANAX) 0.5 MG tablet Take 0.5-1 tablet up to three times daily as needed for anxiety 20 tablet 1   AMBULATORY NON FORMULARY MEDICATION Sig: T4 200 mcg and T3 51 mcg NON PORCINE SR capsules take one tablet daily 90 capsule 1   AMBULATORY NON FORMULARY MEDICATION Naltrexone 3mg  one tablet daily. 90 tablet 1   AMBULATORY NON FORMULARY MEDICATION Progesterone capsules Take two tablets 100mg  SR progesterone capsules daily at bedtime, 4 days off per month  Estradiol 4 mg/ml apply 3 clicks to inner thighs once daily as directed, 4 days off per month 180 capsule 1   citalopram (CELEXA) 40 MG tablet TAKE 1 TABLET BY MOUTH EVERY DAY. 90 tablet 3   metaxalone (SKELAXIN) 800 MG tablet TAKE 1 TABLET (800 MG TOTAL) BY MOUTH 3 (THREE) TIMES DAILY AS NEEDED FOR MUSCLE SPASMS. 90 tablet 0   metoprolol succinate (TOPROL-XL) 25 MG 24 hr tablet TAKE 1/2 TABLET (12.5 MG TOTAL) BY MOUTH 2 (TWO) TIMES DAILY.**DOSE INCREASE** 90 tablet 0   NONFORMULARY OR COMPOUNDED ITEM Apply 8-12 mg topically at bedtime. Estradiol 4 mg/ml Cream 1 each 3   ondansetron (ZOFRAN-ODT) 8 MG disintegrating tablet Take 1 tablet (8 mg total) by mouth every 8 (eight) hours as needed for nausea. 20 tablet 1   Probiotic Product (PROBIOTIC PO) Take 1 capsule by mouth daily.     rizatriptan (MAXALT-MLT) 10 MG disintegrating tablet TAKE 1 TABLET (10 MG TOTAL) BY MOUTH ONCE AS NEEDED FOR MIGRAINE. MAY REPEAT IN 2 HOURS IF NEEDED/ needs appt 12 tablet 0   tamsulosin (FLOMAX) 0.4 MG CAPS capsule Take 1 capsule (0.4 mg total) by mouth daily after supper. 30 capsule 0   traMADol (ULTRAM) 50 MG tablet Take 1 tablet (50 mg total) by mouth  every 8 (eight) hours as needed for up to 4 doses for moderate pain. 4 tablet 0   No current facility-administered medications for this visit.     Past Medical History:  Diagnosis Date   Allergy    Anxiety    Depression    Family history of anesthesia complication    Father had difficult time waking up    Heart palpitations    PVC's   History of kidney stones    Hypothyroidism 04/2007   started post- partum   Migraines    PONV (postoperative nausea and vomiting)    Difficult waking up; had bad migraine after bunionectomy   PVC's (premature ventricular contractions)    Pt saw Dr. for this but pt stated it was fine    Past Surgical History:  Procedure Laterality Date   BREAST REDUCTION SURGERY Bilateral 11/02/2021   Procedure: MAMMARY REDUCTION  (BREAST) WITH LIPOSUCTION;  Surgeon: Jens Som, DO;  Location: [facility] OR;  Service: Plastics;  Laterality: Bilateral;  3 hours   BUNIONECTOMY Bilateral    x 2 on both feet   CHOLECYSTECTOMY N/A 05/22/2013   Procedure: LAPAROSCOPIC CHOLECYSTECTOMY;  Surgeon: Peggye Form, MD;  Location: [facility] OR;  Service: General;  Laterality: N/A;   essure BTL     UPPER GI ENDOSCOPY      Social History   Socioeconomic History   Marital status: Married  Spouse name: Not on file   Number of children: 3   Years of education: Not on file   Highest education level: Not on file  Occupational History   Occupation: Print production planner  Tobacco Use   Smoking status: Never   Smokeless tobacco: Never  Vaping Use   Vaping Use: Never used  Substance and Sexual Activity   Alcohol use: No   Drug use: No   Sexual activity: Not on file  Other Topics Concern   Not on file  Social History Narrative   Not on file   Social Determinants of Health   Financial Resource Strain: Not on file  Food Insecurity: Not on file  Transportation Needs: Not on file  Physical Activity: Not on file  Stress: Not on file  Social Connections: Not on  file  Intimate Partner Violence: Not on file    Family History  Problem Relation Age of Onset   Heart disease Father        Atrial fibrillation   Atrial fibrillation Father    Atrial fibrillation Mother    Cancer Maternal Aunt        breast   Breast cancer Maternal Aunt    Cancer Paternal Aunt        non hodgkins lymphoma   Cancer Maternal Grandmother        breast   Breast cancer Maternal Grandmother     ROS: no fevers or chills, productive cough, hemoptysis, dysphasia, odynophagia, melena, hematochezia, dysuria, hematuria, rash, seizure activity, orthopnea, PND, pedal edema, claudication. Remaining systems are negative.  Physical Exam: Well-developed well-nourished in no acute distress.  Skin is warm and dry.  HEENT is normal.  Neck is supple.  Chest is clear to auscultation with normal expansion.  Cardiovascular exam is regular rate and rhythm.  Abdominal exam nontender or distended. No masses palpated. Extremities show no edema. neuro grossly intact  ECG-normal sinus rhythm with no ST changes.  Personally reviewed  A/P  1 palpitations-symptoms are controlled at present.  Continue beta-blocker.  2 PVCs-continue beta-blocker.  LV function normal on previous echocardiogram.  Olga Millers, MD

## 2022-05-05 ENCOUNTER — Telehealth: Payer: Self-pay | Admitting: Plastic Surgery

## 2022-05-05 NOTE — Telephone Encounter (Signed)
Pt called and stated that she has an appt on 12/5 scheduled for a follow up but she had the 6 month f/u on 10/20 where her and provider discussed a procedure to be done in the office and she wondered if this is it. Procedure is to do excision of right breast medially. She would like to confirm if this appt is for this procedure or not.

## 2022-05-08 ENCOUNTER — Encounter: Payer: Self-pay | Admitting: Plastic Surgery

## 2022-05-08 NOTE — Telephone Encounter (Signed)
Pt is calling back and stated that she has an appt on 12/5 scheduled for a follow up but she had the 6 month f/u on 10/20 where her and provider discussed a procedure to be done in the office and she wondered if this is it. Procedure is to do excision of right breast medially. She would like to confirm if this appt is for this procedure or not.

## 2022-05-09 ENCOUNTER — Ambulatory Visit: Payer: BC Managed Care – PPO | Admitting: Plastic Surgery

## 2022-05-09 ENCOUNTER — Telehealth: Payer: Self-pay | Admitting: Plastic Surgery

## 2022-05-09 NOTE — Telephone Encounter (Signed)
Called to book her an appointment and she just wanted a call back from clinical staff or provider on how she needs to prepare for the procedure.

## 2022-05-09 NOTE — Telephone Encounter (Signed)
I would recommend scheduling for televisit next week, I believe her appt in January 5

## 2022-05-10 ENCOUNTER — Encounter: Payer: Self-pay | Admitting: Cardiology

## 2022-05-10 ENCOUNTER — Ambulatory Visit (INDEPENDENT_AMBULATORY_CARE_PROVIDER_SITE_OTHER): Payer: BC Managed Care – PPO | Admitting: Cardiology

## 2022-05-10 ENCOUNTER — Telehealth: Payer: Self-pay | Admitting: *Deleted

## 2022-05-10 VITALS — BP 116/49 | HR 78 | Ht 66.5 in | Wt 147.4 lb

## 2022-05-10 DIAGNOSIS — R002 Palpitations: Secondary | ICD-10-CM

## 2022-05-10 DIAGNOSIS — I493 Ventricular premature depolarization: Secondary | ICD-10-CM

## 2022-05-10 MED ORDER — METOPROLOL SUCCINATE ER 25 MG PO TB24: ORAL_TABLET | ORAL | 3 refills | Status: DC

## 2022-05-10 NOTE — Telephone Encounter (Signed)
I spoke with Sydney Bailey and informed her that Dr. Ulice Bold does not require her to do any special preparation prior to her appointment in January for in office procedure.

## 2022-05-10 NOTE — Patient Instructions (Signed)
  Follow-Up: At [facility], you and your health needs are our priority.  As part of our continuing mission to provide you with exceptional heart care, we have created designated Provider Care Teams.  These Care Teams include your primary Cardiologist (physician) and Advanced Practice Providers (APPs -  Physician Assistants and Nurse Practitioners) who all work together to provide you with the care you need, when you need it.  We recommend signing up for the patient portal called "MyChart".  Sign up information is provided on this After Visit Summary.  MyChart is used to connect with patients for Virtual Visits (Telemedicine).  Patients are able to view lab/test results, encounter notes, upcoming appointments, etc.  Non-urgent messages can be sent to your provider as well.   To learn more about what you can do with MyChart, go to [email or web address].    Your next appointment:   12 month(s)  The format for your next appointment:   In Person  Provider:   Brian Crenshaw, MD   

## 2022-05-17 ENCOUNTER — Telehealth: Payer: Self-pay | Admitting: *Deleted

## 2022-05-17 NOTE — Telephone Encounter (Signed)
Patient returning Danielle's call about a reschedule? Unsure about that part.   Patient will send new insurance info for 2024 once she has it.

## 2022-05-17 NOTE — Telephone Encounter (Deleted)
Patient returning Danielle's phone call about a reschedule? No notes so I was unable to help her with that. Patient to send new insurance for 2024 as soon as she has the information.

## 2022-06-09 ENCOUNTER — Ambulatory Visit: Payer: BC Managed Care – PPO | Admitting: Plastic Surgery

## 2022-06-13 ENCOUNTER — Encounter: Payer: Self-pay | Admitting: Neurology

## 2022-06-13 DIAGNOSIS — Z780 Asymptomatic menopausal state: Secondary | ICD-10-CM

## 2022-06-13 DIAGNOSIS — R7989 Other specified abnormal findings of blood chemistry: Secondary | ICD-10-CM

## 2022-06-13 NOTE — Telephone Encounter (Signed)
Patient LVM stating she needs refills on compound medications. Did not know if she was due for appt. Asked for call or mychart message back. Sent message letting her know she is due for appt and asked her to let us know if she needs refills prior to get her til appt. Awaiting correspondence.

## 2022-06-19 ENCOUNTER — Telehealth: Payer: Self-pay | Admitting: Physician Assistant

## 2022-06-19 ENCOUNTER — Encounter: Payer: Self-pay | Admitting: Plastic Surgery

## 2022-06-19 NOTE — Telephone Encounter (Signed)
Patient called she is scheduled next week for an appt she is requesting refills for 30 days until she is seen.

## 2022-06-20 MED ORDER — AMBULATORY NON FORMULARY MEDICATION: 0 refills | Status: DC

## 2022-06-20 MED ORDER — AMBULATORY NON FORMULARY MEDICATION
0 refills | Status: DC
Start: 2022-06-20 — End: 2023-09-19

## 2022-06-20 NOTE — Telephone Encounter (Signed)
Okay to order meds for 3 months? Pended.

## 2022-06-20 NOTE — Telephone Encounter (Signed)
Other encounter already open about this, closing encounter and documenting in alternate.

## 2022-06-20 NOTE — Addendum Note (Signed)
Addended byAnnamaria Helling on: 06/20/2022 09:45 AM   Modules accepted: Orders

## 2022-06-26 ENCOUNTER — Encounter: Payer: Self-pay | Admitting: Physician Assistant

## 2022-06-26 ENCOUNTER — Telehealth (INDEPENDENT_AMBULATORY_CARE_PROVIDER_SITE_OTHER): Payer: BC Managed Care – PPO | Admitting: Physician Assistant

## 2022-06-26 DIAGNOSIS — F419 Anxiety disorder, unspecified: Secondary | ICD-10-CM | POA: Insufficient documentation

## 2022-06-26 DIAGNOSIS — F410 Panic disorder [episodic paroxysmal anxiety] without agoraphobia: Secondary | ICD-10-CM

## 2022-06-26 DIAGNOSIS — M62838 Other muscle spasm: Secondary | ICD-10-CM | POA: Insufficient documentation

## 2022-06-26 DIAGNOSIS — Z13220 Encounter for screening for lipoid disorders: Secondary | ICD-10-CM

## 2022-06-26 DIAGNOSIS — F3342 Major depressive disorder, recurrent, in full remission: Secondary | ICD-10-CM | POA: Diagnosis not present

## 2022-06-26 DIAGNOSIS — R7989 Other specified abnormal findings of blood chemistry: Secondary | ICD-10-CM

## 2022-06-26 DIAGNOSIS — E039 Hypothyroidism, unspecified: Secondary | ICD-10-CM

## 2022-06-26 DIAGNOSIS — Z780 Asymptomatic menopausal state: Secondary | ICD-10-CM

## 2022-06-26 DIAGNOSIS — G43009 Migraine without aura, not intractable, without status migrainosus: Secondary | ICD-10-CM

## 2022-06-26 MED ORDER — METAXALONE 800 MG PO TABS: 800.0000 mg | ORAL_TABLET | Freq: Three times a day (TID) | ORAL | 1 refills | Status: DC | PRN

## 2022-06-26 MED ORDER — ALPRAZOLAM 0.5 MG PO TABS: ORAL_TABLET | ORAL | 1 refills | Status: DC

## 2022-06-26 MED ORDER — RIZATRIPTAN BENZOATE 10 MG PO TBDP: ORAL_TABLET | ORAL | 5 refills | Status: DC

## 2022-06-26 MED ORDER — CITALOPRAM HYDROBROMIDE 40 MG PO TABS: ORAL_TABLET | ORAL | 3 refills | Status: DC

## 2022-06-26 NOTE — Progress Notes (Signed)
Following up for med refills - already sent to [facility] She does want to wait on labs, ran out of medication and wants to make sure it is working well before checking labs  Last labs in April 2023

## 2022-06-26 NOTE — Progress Notes (Signed)
..Virtual Visit via Video Note  I connected with Sydney Bailey on 06/26/22 at  9:50 AM EST by a video enabled telemedicine application and verified that I am speaking with the correct person using two identifiers.  Location: Patient: work Provider: clinic  .Marland KitchenParticipating in visit:  Patient: Sydney Bailey Provider: Iran Planas PA-C   I discussed the limitations of evaluation and management by telemedicine and the availability of in person appointments. The patient expressed understanding and agreed to proceed.  History of Present Illness: Pt is a 57 yo female with hypothyroidism, migraines, anxiety, panic attacks, low progesterone levels who needs appt for future refills and for labs to be ordered.   She is doing well but was out of her medications for a few weeks. Has a 61 day at pharmacy but will wait to have labs drawn.   Anxiety is well controlled. Very rarely using xanax.  No recent kidney stones.   Migraines as needed maxalt for rescue.   .. Active Ambulatory Problems    Diagnosis Date Noted   Hypothyroidism 11/22/2007   PANIC ATTACK 10/02/2008   Migraine without aura 11/22/2007   MENORRHAGIA 07/27/2008   Palpitations 03/19/2013   Dyspnea 03/19/2013   Depression 08/11/2014   Ventricular quadrigeminy 04/21/2016   PVC's (premature ventricular contractions) 04/21/2016   Low serum progesterone 06/09/2016   Menopause 03/27/2017   ETD (Eustachian tube dysfunction), right 03/27/2017   No energy 10/14/2017   Vertigo 10/14/2017   Hyperlipidemia LDL goal <160 05/07/2018   Persistent shortness of breath after COVID-19 07/01/2020   Recurrent major depressive disorder, in full remission ([place]) 07/20/2020   Right hip pain 10/11/2020   Chronic right-sided low back pain with right-sided sciatica 10/11/2020   DDD (degenerative disc disease), lumbar 10/13/2020   Facet arthritis of lumbar region 10/13/2020   Kidney stone on left side 04/27/2021   Hot flashes 05/03/2021   Large  breasts 05/03/2021   Upper back pain 05/03/2021   Hematuria 05/04/2021   Left flank pain 05/04/2021   Nausea and vomiting 05/04/2021   History of kidney stones 05/04/2021   Chronic bilateral thoracic back pain 08/01/2021   Breast asymmetry 03/24/2022   Anxiety 06/26/2022   Resolved Ambulatory Problems    Diagnosis Date Noted   SYNCOPE 02/20/2008   POSTURAL LIGHTHEADEDNESS 12/25/2007   FATIGUE 07/27/2008   Past Medical History:  Diagnosis Date   Allergy    Family history of anesthesia complication    Heart palpitations    Migraines    PONV (postoperative nausea and vomiting)        Observations/Objective: No acute distress Normal mood and appearance Normal breathing  ..    06/26/2022   11:42 AM 08/13/2020    9:19 AM 07/16/2019    9:35 AM 05/07/2018    1:26 PM  GAD 7 : Generalized Anxiety Score  Nervous, Anxious, on Edge 1 2 1 1   Control/stop worrying 0 1 0 0  Worry too much - different things 1 1 0 0  Trouble relaxing 1 1 0 1  Restless 0 0 0 1  Easily annoyed or irritable 1 2 0 1  Afraid - awful might happen 0 0 0 1  Total GAD 7 Score 4 7 1 5   Anxiety Difficulty Not difficult at all Somewhat difficult Not difficult at all Not difficult at all    ..    06/26/2022   11:42 AM 06/26/2022    9:09 AM 07/29/2021    8:47 AM 10/11/2020    9:18 AM  08/13/2020    9:17 AM  Depression screen PHQ 2/9  Decreased Interest 0 1 1 0 1  Down, Depressed, Hopeless 1 0 1 0 1  PHQ - 2 Score 1 1 2  0 2  Altered sleeping 0    1  Tired, decreased energy 1    1  Change in appetite 0    1  Feeling bad or failure about yourself  0    3  Trouble concentrating 0    0  Moving slowly or fidgety/restless 0    0  Suicidal thoughts 0    0  PHQ-9 Score 2    8  Difficult doing work/chores     Extremely dIfficult      Assessment and Plan: Marland KitchenMarland KitchenMarili was seen today for follow-up.  Diagnoses and all orders for this visit:  Hypothyroidism, unspecified type -     COMPLETE METABOLIC PANEL WITH  GFR -     Thyroid Panel With TSH -     CBC w/Diff/Platelet  Menopause -     COMPLETE METABOLIC PANEL WITH GFR -     Progesterone -     Estradiol  Low serum progesterone -     COMPLETE METABOLIC PANEL WITH GFR -     Progesterone -     Estradiol  Recurrent major depressive disorder, in full remission (HCC) -     citalopram (CELEXA) 40 MG tablet; TAKE 1 TABLET BY MOUTH EVERY DAY. -     COMPLETE METABOLIC PANEL WITH GFR  Migraine without aura and without status migrainosus, not intractable -     rizatriptan (MAXALT-MLT) 10 MG disintegrating tablet; TAKE 1 TABLET (10 MG TOTAL) BY MOUTH ONCE AS NEEDED FOR MIGRAINE. MAY REPEAT IN 2 HOURS IF NEEDED. -     COMPLETE METABOLIC PANEL WITH GFR  PANIC ATTACK -     ALPRAZolam (XANAX) 0.5 MG tablet; Take 0.5-1 tablet up to three times daily as needed for anxiety -     COMPLETE METABOLIC PANEL WITH GFR  Anxiety -     ALPRAZolam (XANAX) 0.5 MG tablet; Take 0.5-1 tablet up to three times daily as needed for anxiety -     COMPLETE METABOLIC PANEL WITH GFR  Muscle spasm -     metaxalone (SKELAXIN) 800 MG tablet; Take 1 tablet (800 mg total) by mouth 3 (three) times daily as needed for muscle spasms. -     COMPLETE METABOLIC PANEL WITH GFR  Post-menopausal -     COMPLETE METABOLIC PANEL WITH GFR -     VITAMIN D 25 Hydroxy (Vit-D Deficiency, Fractures)  Screening for lipid disorders -     Lipid Panel w/reflex Direct LDL   Getting labs in 2 months and then will send 90 day rx in [facility] She has been out some and wants to get back on them before labs She reports to be doing well PHQ/GAD numbers look good Xanax only has needed and has not had refilled since 2022, sent refill today     Follow Up Instructions:    I discussed the assessment and treatment plan with the patient. The patient was provided an opportunity to ask questions and all were answered. The patient agreed with the plan and demonstrated an  understanding of the instructions.   The patient was advised to call back or seek an in-person evaluation if the symptoms worsen or if the condition fails to improve as anticipated.     Iran Planas, PA-C

## 2022-06-27 ENCOUNTER — Encounter: Payer: Self-pay | Admitting: Plastic Surgery

## 2022-06-27 ENCOUNTER — Ambulatory Visit: Payer: BC Managed Care – PPO | Admitting: Plastic Surgery

## 2022-06-27 VITALS — BP 130/82 | HR 71

## 2022-06-27 DIAGNOSIS — N651 Disproportion of reconstructed breast: Secondary | ICD-10-CM | POA: Diagnosis not present

## 2022-06-27 DIAGNOSIS — N62 Hypertrophy of breast: Secondary | ICD-10-CM

## 2022-06-27 NOTE — Progress Notes (Signed)
Procedure Note  Preoperative Dx: breast asymmetry on right  Postoperative Dx: Same  Procedure: excision of right breast   Anesthesia: Lidocaine 1% with 1:100,000 epinephrine  Indication for Procedure: breast asymmetry  Description of Procedure: Risks and complications were explained to the patient.  Consent was confirmed and the patient understands the risks and benefits.  The potential complications and alternatives were explained and the patient consents.  The patient expressed understanding the option of not having the procedure and the risks of a scar.  Time out was called and all information was confirmed to be correct.    The area was prepped and drapped.  Lidocaine 1% with epinephrine was injected in the subcutaneous area.  After waiting several minutes for the local to take affect a #15 blade was used to excise the area in an eliptical pattern at the lower breast at the inframammary fold (4 x 15 cm).  A 3-0 Monocryl was used to close the deep layers with simple interrupted stitches.  The skin edges were reapproximated with 4-0 Monocryl subcuticular running closure.  A dressing was applied.  The patient was given instructions on how to care for the area and a follow up appointment.  Sydney Bailey tolerated the procedure well and there were no complications.

## 2022-06-30 ENCOUNTER — Telehealth: Payer: Self-pay | Admitting: Plastic Surgery

## 2022-06-30 NOTE — Telephone Encounter (Signed)
I called and LVM.

## 2022-06-30 NOTE — Telephone Encounter (Signed)
Pt called and wanted to know if she was supposed to get an antibiotic after her procedure?  There was not [facility] at [facility] when her [facility] picked [facility] all her prescriptions. Please give pt a call.

## 2022-07-04 ENCOUNTER — Ambulatory Visit (INDEPENDENT_AMBULATORY_CARE_PROVIDER_SITE_OTHER): Payer: BC Managed Care – PPO | Admitting: Physician Assistant

## 2022-07-04 DIAGNOSIS — N6489 Other specified disorders of breast: Secondary | ICD-10-CM

## 2022-07-04 NOTE — Progress Notes (Signed)
This is a 57 year old female postop day 6 status post excision of right breast for asymmetry by Dr. Marla Roe on 06/27/2022.  Patient notes that status post the procedure she has been doing well, she notes she did have for several days after the procedure but this has significantly improved.  She denies any infectious signs or symptoms.  Chaperone present.  On exam dressing was removed revealing clean dry and intact Steri-Strips with no surrounding redness warmth, palpable fluid collections or induration.  Overall the patient is doing well postoperatively, she has no issues or concerns at today's visit.  I would like to see her back in our office in 1 week for repeat evaluation.  I did give her strict return precautions.  She verbalized understanding and agreement to today's plan had no further questions or concerns.

## 2022-07-11 ENCOUNTER — Ambulatory Visit (INDEPENDENT_AMBULATORY_CARE_PROVIDER_SITE_OTHER): Payer: BC Managed Care – PPO | Admitting: Physician Assistant

## 2022-07-11 ENCOUNTER — Encounter: Payer: Self-pay | Admitting: Physician Assistant

## 2022-07-11 VITALS — BP 105/53 | HR 65

## 2022-07-11 DIAGNOSIS — Z9889 Other specified postprocedural states: Secondary | ICD-10-CM

## 2022-07-11 NOTE — Progress Notes (Signed)
This is a 57 year old female seen in office for postop evaluation status post excision of right breast for asymmetry by Dr. Marla Roe on 06/27/2022.  Patient notes she has been doing well, she denies any significant complaints.  No infectious signs or symptoms.  Chaperone present.  On exam Steri-Strips removed revealing a clean intact and dry incision with no surrounding redness discharge, no wounds.  Breast palpated with no areas of firmness or palpable fluid collections.  Photos taken today  Overall the patient is doing very well with no issues at today's visit.  Her incision is healing very well with no issues at this time.  The patient will use Vaseline for another 4 to 5 days followed by scar cream or scar tape.  She will follow-up after her visit to [place] in our office for a final reevaluation in approximately 6 weeks.  She was given strict return precautions.  She verbalized understanding and agreement to today's plan.

## 2022-07-19 ENCOUNTER — Encounter: Payer: Self-pay | Admitting: Physician Assistant

## 2022-07-21 MED ORDER — TRAZODONE HCL 50 MG PO TABS: 25.0000 mg | ORAL_TABLET | Freq: Every evening | ORAL | 0 refills | Status: DC | PRN

## 2022-08-19 ENCOUNTER — Other Ambulatory Visit: Payer: Self-pay | Admitting: Physician Assistant

## 2022-08-19 DIAGNOSIS — G43009 Migraine without aura, not intractable, without status migrainosus: Secondary | ICD-10-CM

## 2022-08-21 NOTE — Telephone Encounter (Signed)
Needs PA 

## 2022-08-27 ENCOUNTER — Other Ambulatory Visit: Payer: Self-pay | Admitting: Physician Assistant

## 2022-08-29 ENCOUNTER — Encounter: Payer: Self-pay | Admitting: Physician Assistant

## 2022-08-30 NOTE — Telephone Encounter (Signed)
Thank you :)

## 2022-09-05 ENCOUNTER — Ambulatory Visit (INDEPENDENT_AMBULATORY_CARE_PROVIDER_SITE_OTHER): Payer: BC Managed Care – PPO | Admitting: Plastic Surgery

## 2022-09-05 ENCOUNTER — Encounter: Payer: Self-pay | Admitting: Plastic Surgery

## 2022-09-05 VITALS — BP 105/67 | HR 71 | Ht 66.5 in | Wt 148.6 lb

## 2022-09-05 DIAGNOSIS — N6489 Other specified disorders of breast: Secondary | ICD-10-CM

## 2022-09-05 NOTE — Progress Notes (Signed)
   Subjective:    Patient ID: Sydney Bailey, female    DOB: [birth year], 57 y.o.   MRN: [id number]  The patient is a 57 year old female here for follow-up after a breast reduction.  She then had a revision on the right breast with more removed at the inframammary fold.  She is doing really well.  She is very pleased with the results.  Essentially no back pain.  She is wearing a sports bra.    Review of Systems     Objective:   Physical Exam     Assessment & Plan:     ICD-10-CM   1. Breast asymmetry  N64.89        Follow-up as needed, doing wonderful.  Pictures were obtained of the patient and placed in the chart with the patient's or guardian's permission.

## 2022-09-13 DIAGNOSIS — E039 Hypothyroidism, unspecified: Secondary | ICD-10-CM | POA: Diagnosis not present

## 2022-09-13 DIAGNOSIS — Z13220 Encounter for screening for lipoid disorders: Secondary | ICD-10-CM | POA: Diagnosis not present

## 2022-09-13 DIAGNOSIS — F3342 Major depressive disorder, recurrent, in full remission: Secondary | ICD-10-CM | POA: Diagnosis not present

## 2022-09-13 DIAGNOSIS — R7989 Other specified abnormal findings of blood chemistry: Secondary | ICD-10-CM | POA: Diagnosis not present

## 2022-09-13 DIAGNOSIS — E559 Vitamin D deficiency, unspecified: Secondary | ICD-10-CM | POA: Diagnosis not present

## 2022-09-14 LAB — LIPID PANEL W/REFLEX DIRECT LDL
Cholesterol: 186 mg/dL (ref <200)
HDL: 50 mg/dL (ref >=50)
LDL Cholesterol (Calc): 109 mg/dL (calc) — ABNORMAL HIGH
Non-HDL Cholesterol (Calc): 136 mg/dL (calc) — ABNORMAL HIGH (ref <130)
Total CHOL/HDL Ratio: 3.7 (calc) (ref <5.0)
Triglycerides: 159 mg/dL — ABNORMAL HIGH (ref <150)

## 2022-09-14 LAB — CBC WITH DIFFERENTIAL/PLATELET
Absolute Monocytes: 583 cells/uL (ref 200–950)
Basophils Absolute: 57 cells/uL (ref 0–200)
Basophils Relative: 0.7 %
Eosinophils Absolute: 178 cells/uL (ref 15–500)
Eosinophils Relative: 2.2 %
HCT: 42.4 % (ref 35.0–45.0)
Hemoglobin: 14.1 g/dL (ref 11.7–15.5)
Lymphs Abs: 1750 cells/uL (ref 850–3900)
MCH: 30.7 pg (ref 27.0–33.0)
MCHC: 33.3 g/dL (ref 32.0–36.0)
MCV: 92.2 fL (ref 80.0–100.0)
MPV: 9.7 fL (ref 7.5–12.5)
Monocytes Relative: 7.2 %
Neutro Abs: 5532 cells/uL (ref 1500–7800)
Neutrophils Relative %: 68.3 %
Platelets: 281 10*3/uL (ref 140–400)
RBC: 4.6 10*6/uL (ref 3.80–5.10)
RDW: 12.3 % (ref 11.0–15.0)
Total Lymphocyte: 21.6 %
WBC: 8.1 10*3/uL (ref 3.8–10.8)

## 2022-09-14 LAB — COMPLETE METABOLIC PANEL WITH GFR
AG Ratio: 1.4 (calc) (ref 1.0–2.5)
ALT: 9 U/L (ref 6–29)
AST: 14 U/L (ref 10–35)
Albumin: 4 g/dL (ref 3.6–5.1)
Alkaline phosphatase (APISO): 109 U/L (ref 37–153)
BUN/Creatinine Ratio: 22 (calc) (ref 6–22)
BUN: 11 mg/dL (ref 7–25)
CO2: 27 mmol/L (ref 20–32)
Calcium: 9.2 mg/dL (ref 8.6–10.4)
Chloride: 107 mmol/L (ref 98–110)
Creat: 0.49 mg/dL — ABNORMAL LOW (ref 0.50–1.03)
Globulin: 2.8 g/dL (calc) (ref 1.9–3.7)
Glucose, Bld: 92 mg/dL (ref 65–99)
Potassium: 4.3 mmol/L (ref 3.5–5.3)
Sodium: 140 mmol/L (ref 135–146)
Total Bilirubin: 0.5 mg/dL (ref 0.2–1.2)
Total Protein: 6.8 g/dL (ref 6.1–8.1)
eGFR: 112 mL/min/{1.73_m2} (ref >=60)

## 2022-09-14 LAB — ESTRADIOL: Estradiol: 128 pg/mL

## 2022-09-14 LAB — THYROID PANEL WITH TSH
Free Thyroxine Index: 4 — ABNORMAL HIGH (ref 1.4–3.8)
T3 Uptake: 36 % — ABNORMAL HIGH (ref 22–35)
T4, Total: 11.1 ug/dL (ref 5.1–11.9)
TSH: 0.01 mIU/L — ABNORMAL LOW

## 2022-09-14 LAB — VITAMIN D 25 HYDROXY (VIT D DEFICIENCY, FRACTURES): Vit D, 25-Hydroxy: 17 ng/mL — ABNORMAL LOW (ref 30–100)

## 2022-09-14 LAB — PROGESTERONE: Progesterone: 9.5 ng/mL

## 2022-09-14 NOTE — Progress Notes (Signed)
[place],   Cholesterol looks so much better. GREAT work.  TSH is too suppressed.   Will [facility] all [facility] to compute new dosages of medications. (Key please ask me about this if unsure of what to do)   Vitamin D dropped. Are you taking any? With dairy?

## 2022-09-16 ENCOUNTER — Telehealth: Payer: Self-pay

## 2022-09-16 NOTE — Telephone Encounter (Signed)
Initiated Prior authorization [id number] Benzoate 10MG  dispersible tablets Via: Covermymeds Case/Key:[id number]  Status: Pending as of 09/16/22 Reason: Notified Pt via: Mychart

## 2022-09-18 NOTE — Telephone Encounter (Signed)
Attempted call to [facility] ( phone #[phone] and fax # [phone]) - left voice mail message requesting a return call. Time of call was  4:55pm on Monday 09/18/22.

## 2022-09-19 ENCOUNTER — Telehealth: Payer: Self-pay

## 2022-09-19 NOTE — Telephone Encounter (Signed)
Spoke to pharmacy tech. Will refax lab work for [facility] - pharmacist to review.

## 2022-09-19 NOTE — Telephone Encounter (Signed)
Faxed  [facility]  pt labs confirmation  faxed has been confirmed called pt to notify

## 2022-09-19 NOTE — Telephone Encounter (Signed)
Again called [facility]  and left voice mail message requesting a return call.

## 2022-10-16 ENCOUNTER — Encounter: Payer: Self-pay | Admitting: Physician Assistant

## 2022-12-18 ENCOUNTER — Encounter: Payer: Self-pay | Admitting: Physician Assistant

## 2023-02-13 ENCOUNTER — Encounter: Payer: Self-pay | Admitting: Physician Assistant

## 2023-02-13 ENCOUNTER — Ambulatory Visit: Payer: BC Managed Care – PPO | Admitting: Physician Assistant

## 2023-02-13 VITALS — BP 118/68 | HR 74 | Ht 66.0 in | Wt 150.0 lb

## 2023-02-13 DIAGNOSIS — W57XXXA Bitten or stung by nonvenomous insect and other nonvenomous arthropods, initial encounter: Secondary | ICD-10-CM

## 2023-02-13 DIAGNOSIS — Z7184 Encounter for health counseling related to travel: Secondary | ICD-10-CM

## 2023-02-13 DIAGNOSIS — S40862A Insect bite (nonvenomous) of left upper arm, initial encounter: Secondary | ICD-10-CM | POA: Diagnosis not present

## 2023-02-13 MED ORDER — CEPHALEXIN 500 MG PO CAPS: 500.0000 mg | ORAL_CAPSULE | Freq: Two times a day (BID) | ORAL | 0 refills | Status: DC

## 2023-02-13 MED ORDER — CIPROFLOXACIN HCL 500 MG PO TABS: 500.0000 mg | ORAL_TABLET | Freq: Two times a day (BID) | ORAL | 0 refills | Status: DC

## 2023-02-13 NOTE — Patient Instructions (Signed)
Start keflex Keep covered until lesion heals and not draining Use topical abx after changing bandage

## 2023-02-13 NOTE — Progress Notes (Unsigned)
Acute Office Visit  Subjective:     Patient ID: Sydney Bailey, female    DOB: 02-18-1966, 57 y.o.   MRN: [id number]  Chief Complaint  Patient presents with   Insect Bite    HPI Patient is in today for insect bite on left shoulder that she noticed on Sunday. It has gotten red and very hard. She tried to express anything out of it but only got blood. It is mildly tender to palpation. She is concerned because she leaves for [place] in next week. No fever, chills, body aches.   .. Active Ambulatory Problems    Diagnosis Date Noted   Hypothyroidism 11/22/2007   PANIC ATTACK 10/02/2008   Migraine without aura 11/22/2007   MENORRHAGIA 07/27/2008   Palpitations 03/19/2013   Dyspnea 03/19/2013   Depression 08/11/2014   Ventricular quadrigeminy 04/21/2016   PVC's (premature ventricular contractions) 04/21/2016   Low serum progesterone 06/09/2016   Menopause 03/27/2017   ETD (Eustachian tube dysfunction), right 03/27/2017   No energy 10/14/2017   Vertigo 10/14/2017   Hyperlipidemia LDL goal <160 05/07/2018   Persistent shortness of breath after COVID-19 07/01/2020   Recurrent major depressive disorder, in full remission (HCC) 07/20/2020   Right hip pain 10/11/2020   Chronic right-sided low back pain with right-sided sciatica 10/11/2020   DDD (degenerative disc disease), lumbar 10/13/2020   Facet arthritis of lumbar region 10/13/2020   Kidney stone on left side 04/27/2021   Hot flashes 05/03/2021   Large breasts 05/03/2021   Upper back pain 05/03/2021   Hematuria 05/04/2021   Left flank pain 05/04/2021   Nausea and vomiting 05/04/2021   History of kidney stones 05/04/2021   Chronic bilateral thoracic back pain 08/01/2021   Breast asymmetry 03/24/2022   Anxiety 06/26/2022   Muscle spasm 06/26/2022   Post-menopausal 06/26/2022   Resolved Ambulatory Problems    Diagnosis Date Noted   SYNCOPE 02/20/2008   POSTURAL LIGHTHEADEDNESS 12/25/2007   FATIGUE 07/27/2008   Past  Medical History:  Diagnosis Date   Allergy    Family history of anesthesia complication    Heart palpitations    Migraines    PONV (postoperative nausea and vomiting)      ROS See HPI.      Objective:    BP 118/68   Pulse 74   Ht 5\' 6"  (1.676 m)   Wt 150 lb (68 kg)   LMP 09/01/2010   SpO2 99%   BMI 24.21 kg/m  BP Readings from Last 3 Encounters:  02/13/23 118/68  09/05/22 105/67  07/11/22 (!) 105/53   Wt Readings from Last 3 Encounters:  02/13/23 150 lb (68 kg)  09/05/22 148 lb 9.6 oz (67.4 kg)  05/10/22 147 lb 6.4 oz (66.9 kg)      Physical Exam Dime size area of induration with small pustule in the center of left upper lateral shoulder.        Assessment & Plan:  Marland KitchenMarland KitchenIsola was seen today for insect bite.  Diagnoses and all orders for this visit:  Insect bite of left upper arm, initial encounter -     cephALEXin (KEFLEX) 500 MG capsule; Take 1 capsule (500 mg total) by mouth 2 (two) times daily.  Counseling about travel -     ciprofloxacin (CIPRO) 500 MG tablet; Take 1 tablet (500 mg total) by mouth 2 (two) times daily. For 10 days.   I and D done to pustule with small amt of blood coming out Placed topical abx with non  stick bandage over wound Keflex given for any surrounding cellulitis Keep wound clean and covered until healed Follow up as needed or if symptoms worsen  Discussed upcoming travel and given cipro for travelers diarrhea  Tandy Gaw, PA-C

## 2023-02-14 ENCOUNTER — Encounter: Payer: Self-pay | Admitting: Physician Assistant

## 2023-03-22 DIAGNOSIS — Z01419 Encounter for gynecological examination (general) (routine) without abnormal findings: Secondary | ICD-10-CM | POA: Diagnosis not present

## 2023-03-22 DIAGNOSIS — Z124 Encounter for screening for malignant neoplasm of cervix: Secondary | ICD-10-CM | POA: Diagnosis not present

## 2023-03-22 DIAGNOSIS — Z1151 Encounter for screening for human papillomavirus (HPV): Secondary | ICD-10-CM | POA: Diagnosis not present

## 2023-03-22 DIAGNOSIS — Z6823 Body mass index (BMI) 23.0-23.9, adult: Secondary | ICD-10-CM | POA: Diagnosis not present

## 2023-03-22 DIAGNOSIS — Z1231 Encounter for screening mammogram for malignant neoplasm of breast: Secondary | ICD-10-CM | POA: Diagnosis not present

## 2023-03-26 ENCOUNTER — Other Ambulatory Visit: Payer: Self-pay | Admitting: Obstetrics and Gynecology

## 2023-03-26 DIAGNOSIS — R928 Other abnormal and inconclusive findings on diagnostic imaging of breast: Secondary | ICD-10-CM

## 2023-03-28 ENCOUNTER — Encounter: Payer: Self-pay | Admitting: Physician Assistant

## 2023-03-28 MED ORDER — FLUCONAZOLE 150 MG PO TABS: 150.0000 mg | ORAL_TABLET | Freq: Once | ORAL | 0 refills | Status: AC

## 2023-03-28 MED ORDER — PHENAZOPYRIDINE HCL 200 MG PO TABS: 200.0000 mg | ORAL_TABLET | Freq: Three times a day (TID) | ORAL | 0 refills | Status: AC

## 2023-04-05 ENCOUNTER — Ambulatory Visit
Admission: RE | Admit: 2023-04-05 | Discharge: 2023-04-05 | Disposition: A | Discharge status: Home / Self Care | Payer: BC Managed Care – PPO | Source: Ambulatory Visit | Attending: Obstetrics and Gynecology | Admitting: Obstetrics and Gynecology

## 2023-04-05 DIAGNOSIS — N6012 Diffuse cystic mastopathy of left breast: Secondary | ICD-10-CM | POA: Diagnosis not present

## 2023-04-05 DIAGNOSIS — R928 Other abnormal and inconclusive findings on diagnostic imaging of breast: Secondary | ICD-10-CM | POA: Diagnosis not present

## 2023-04-10 ENCOUNTER — Encounter: Payer: Self-pay | Admitting: Physician Assistant

## 2023-04-11 DIAGNOSIS — Z1211 Encounter for screening for malignant neoplasm of colon: Secondary | ICD-10-CM | POA: Diagnosis not present

## 2023-04-11 NOTE — Telephone Encounter (Signed)
Need further workup. Need appt. Pls schedule

## 2023-04-13 ENCOUNTER — Encounter: Payer: Self-pay | Admitting: Family Medicine

## 2023-04-13 ENCOUNTER — Ambulatory Visit: Payer: BC Managed Care – PPO | Admitting: Family Medicine

## 2023-04-13 VITALS — BP 130/79 | HR 89 | Ht 66.0 in | Wt 150.0 lb

## 2023-04-13 DIAGNOSIS — N898 Other specified noninflammatory disorders of vagina: Secondary | ICD-10-CM | POA: Insufficient documentation

## 2023-04-13 DIAGNOSIS — L299 Pruritus, unspecified: Secondary | ICD-10-CM | POA: Diagnosis not present

## 2023-04-13 LAB — POCT URINALYSIS DIP (CLINITEK)
Bilirubin, UA: NEGATIVE
Glucose, UA: NEGATIVE mg/dL
Ketones, POC UA: NEGATIVE mg/dL
Leukocytes, UA: NEGATIVE
Nitrite, UA: NEGATIVE
POC PROTEIN,UA: NEGATIVE
Spec Grav, UA: 1.015 (ref 1.010–1.025)
Urobilinogen, UA: 0.2 U/dL
pH, UA: 5.5 (ref 5.0–8.0)

## 2023-04-13 MED ORDER — FLUCONAZOLE 150 MG PO TABS: ORAL_TABLET | ORAL | 0 refills | Status: DC

## 2023-04-13 MED ORDER — HYDROXYZINE HCL 10 MG PO TABS: 10.0000 mg | ORAL_TABLET | Freq: Two times a day (BID) | ORAL | 0 refills | Status: DC | PRN

## 2023-04-13 NOTE — Assessment & Plan Note (Signed)
It sounds like pt has some systemic pruritus going on. Will go ahead and get blood work--CMP, CBC, and TSH to rule out internal causes of pruritus  - given hydroxyzine for itching prn  - recommend follow up with pcp next week

## 2023-04-13 NOTE — Assessment & Plan Note (Addendum)
Will go ahead and give pt diflucan since pt said it did seem to improve vagina symptoms. - poc UA is negative for infection so less likely this was uti related.  - will go ahead and have patient submit wet prep to look specifically for vulvovaginal candidiasis

## 2023-04-13 NOTE — Progress Notes (Signed)
Acute Office Visit  Subjective:     Patient ID: Sydney Bailey, female    DOB: April 29, 1966, 57 y.o.   MRN: [id number]  Chief Complaint  Patient presents with   Vaginal Itching    Pt states she itches all over especially in her vaginal area    HPI Patient is in today for "feeling off" and "itching" after being prescribed diflucan and pyridium for a UTI. She is still experiencing pruritus. Denies rash.  Review of Systems  Constitutional:  Negative for chills and fever.  Respiratory:  Negative for cough and shortness of breath.   Cardiovascular:  Negative for chest pain.  Neurological:  Negative for headaches.        Objective:    BP 130/79 (BP Location: Left Arm, Patient Position: Sitting, Cuff Size: Normal)   Pulse 89   Ht 5\' 6"  (1.676 m)   Wt 150 lb (68 kg)   LMP 09/01/2010   SpO2 97%   BMI 24.21 kg/m    Physical Exam Vitals and nursing note reviewed.  Constitutional:      General: She is not in acute distress.    Appearance: Normal appearance.  HENT:     Head: Normocephalic and atraumatic.     Right Ear: External ear normal.     Left Ear: External ear normal.     Nose: Nose normal.  Eyes:     Conjunctiva/sclera: Conjunctivae normal.  Cardiovascular:     Rate and Rhythm: Normal rate and regular rhythm.  Pulmonary:     Effort: Pulmonary effort is normal.     Breath sounds: Normal breath sounds.  Neurological:     General: No focal deficit present.     Mental Status: She is alert and oriented to person, place, and time.  Psychiatric:        Mood and Affect: Mood normal.        Behavior: Behavior normal.        Thought Content: Thought content normal.        Judgment: Judgment normal.     Results for orders placed or performed in visit on 04/13/23  POCT URINALYSIS DIP (CLINITEK)  Result Value Ref Range   Color, UA yellow yellow   Clarity, UA clear clear   Glucose, UA negative negative mg/dL   Bilirubin, UA negative negative   Ketones, POC UA  negative negative mg/dL   Spec Grav, UA 1.308 6.578 - 1.025   Blood, UA moderate (A) negative   pH, UA 5.5 5.0 - 8.0   POC PROTEIN,UA negative negative, trace   Urobilinogen, UA 0.2 0.2 or 1.0 E.U./dL   Nitrite, UA Negative Negative   Leukocytes, UA Negative Negative        Assessment & Plan:   Problem List Items Addressed This Visit       Musculoskeletal and Integument   Pruritus - Primary    It sounds like pt has some systemic pruritus going on. Will go ahead and get blood work--CMP, CBC, and TSH to rule out internal causes of pruritus  - given hydroxyzine for itching prn  - recommend follow up with pcp next week      Relevant Medications   fluconazole (DIFLUCAN) 150 MG tablet   hydrOXYzine (ATARAX) 10 MG tablet   Other Relevant Orders   CMP14+EGFR   CBC w/Diff/Platelet   TSH     Genitourinary   Itching in the vaginal area    Will go ahead and give pt diflucan since pt  said it did seem to improve vagina symptoms.      Relevant Medications   fluconazole (DIFLUCAN) 150 MG tablet   Other Relevant Orders   POCT URINALYSIS DIP (CLINITEK) (Completed)   WET PREP FOR TRICH, YEAST, CLUE    Meds ordered this encounter  Medications   fluconazole (DIFLUCAN) 150 MG tablet    Sig: Take one pill PO and then 72 hours later, take second pill.    Dispense:  2 tablet    Refill:  0   hydrOXYzine (ATARAX) 10 MG tablet    Sig: Take 1 tablet (10 mg total) by mouth 2 (two) times daily as needed for itching.    Dispense:  30 tablet    Refill:  0    Return in about 1 week (around 04/20/2023) for with [name].  Charlton Amor, DO

## 2023-04-14 LAB — CMP14+EGFR
ALT: 17 [IU]/L (ref 0–32)
AST: 19 [IU]/L (ref 0–40)
Albumin: 3.9 g/dL (ref 3.8–4.9)
Alkaline Phosphatase: 107 [IU]/L (ref 44–121)
BUN/Creatinine Ratio: 19 (ref 9–23)
BUN: 10 mg/dL (ref 6–24)
Bilirubin Total: 0.4 mg/dL (ref 0.0–1.2)
CO2: 19 mmol/L — ABNORMAL LOW (ref 20–29)
Calcium: 9.2 mg/dL (ref 8.7–10.2)
Chloride: 103 mmol/L (ref 96–106)
Creatinine, Ser: 0.52 mg/dL — ABNORMAL LOW (ref 0.57–1.00)
Globulin, Total: 2.5 g/dL (ref 1.5–4.5)
Glucose: 138 mg/dL — ABNORMAL HIGH (ref 70–99)
Potassium: 4.2 mmol/L (ref 3.5–5.2)
Sodium: 140 mmol/L (ref 134–144)
Total Protein: 6.4 g/dL (ref 6.0–8.5)
eGFR: 110 mL/min/{1.73_m2} (ref >59)

## 2023-04-14 LAB — CBC WITH DIFFERENTIAL/PLATELET
Basophils Absolute: 0 10*3/uL (ref 0.0–0.2)
Basos: 1 %
EOS (ABSOLUTE): 0.2 10*3/uL (ref 0.0–0.4)
Eos: 4 %
Hematocrit: 40 % (ref 34.0–46.6)
Hemoglobin: 13.4 g/dL (ref 11.1–15.9)
Immature Grans (Abs): 0 10*3/uL (ref 0.0–0.1)
Immature Granulocytes: 0 %
Lymphocytes Absolute: 2 10*3/uL (ref 0.7–3.1)
Lymphs: 33 %
MCH: 30.6 pg (ref 26.6–33.0)
MCHC: 33.5 g/dL (ref 31.5–35.7)
MCV: 91 fL (ref 79–97)
Monocytes Absolute: 0.5 10*3/uL (ref 0.1–0.9)
Monocytes: 9 %
Neutrophils Absolute: 3.1 10*3/uL (ref 1.4–7.0)
Neutrophils: 53 %
Platelets: 293 10*3/uL (ref 150–450)
RBC: 4.38 x10E6/uL (ref 3.77–5.28)
RDW: 11.5 % — ABNORMAL LOW (ref 11.7–15.4)
WBC: 5.9 10*3/uL (ref 3.4–10.8)

## 2023-04-14 LAB — WET PREP FOR TRICH, YEAST, CLUE
Clue Cell Exam: NEGATIVE
Trichomonas Exam: NEGATIVE
Yeast Exam: NEGATIVE

## 2023-04-14 LAB — TSH: TSH: <0.005 u[IU]/mL — ABNORMAL LOW (ref 0.450–4.500)

## 2023-04-16 ENCOUNTER — Encounter: Payer: Self-pay | Admitting: Physician Assistant

## 2023-04-19 LAB — COLOGUARD: COLOGUARD: NEGATIVE

## 2023-04-19 LAB — EXTERNAL GENERIC LAB PROCEDURE: COLOGUARD: NEGATIVE

## 2023-04-20 ENCOUNTER — Telehealth (INDEPENDENT_AMBULATORY_CARE_PROVIDER_SITE_OTHER): Payer: BC Managed Care – PPO | Admitting: Physician Assistant

## 2023-04-20 DIAGNOSIS — R7989 Other specified abnormal findings of blood chemistry: Secondary | ICD-10-CM

## 2023-04-20 DIAGNOSIS — E039 Hypothyroidism, unspecified: Secondary | ICD-10-CM

## 2023-04-20 DIAGNOSIS — Z79899 Other long term (current) drug therapy: Secondary | ICD-10-CM

## 2023-04-20 DIAGNOSIS — L299 Pruritus, unspecified: Secondary | ICD-10-CM

## 2023-04-20 MED ORDER — HYDROXYZINE HCL 10 MG PO TABS: 10.0000 mg | ORAL_TABLET | Freq: Two times a day (BID) | ORAL | 0 refills | Status: DC | PRN

## 2023-04-20 NOTE — Progress Notes (Unsigned)
..  Virtual Visit via Video Note  I connected with Sydney Bailey on 04/20/23 at  1:00 PM EST by a video enabled telemedicine application and verified that I am speaking with the correct person using two identifiers.  Location: Patient: *** Provider: ***   I discussed the limitations of evaluation and management by telemedicine and the availability of in person appointments. The patient expressed understanding and agreed to proceed.  History of Present Illness:  Weight staying the same    Observations/Objective:   Assessment and Plan:   Follow Up Instructions:    I discussed the assessment and treatment plan with the patient. The patient was provided an opportunity to ask questions and all were answered. The patient agreed with the plan and demonstrated an understanding of the instructions.   The patient was advised to call back or seek an in-person evaluation if the symptoms worsen or if the condition fails to improve as anticipated.  I provided *** minutes of non-face-to-face time during this encounter.   Tandy Gaw, PA-C

## 2023-04-23 ENCOUNTER — Encounter: Payer: Self-pay | Admitting: Physician Assistant

## 2023-04-24 DIAGNOSIS — Z79899 Other long term (current) drug therapy: Secondary | ICD-10-CM | POA: Diagnosis not present

## 2023-04-24 DIAGNOSIS — R7989 Other specified abnormal findings of blood chemistry: Secondary | ICD-10-CM | POA: Diagnosis not present

## 2023-04-24 DIAGNOSIS — E039 Hypothyroidism, unspecified: Secondary | ICD-10-CM | POA: Diagnosis not present

## 2023-04-24 DIAGNOSIS — L299 Pruritus, unspecified: Secondary | ICD-10-CM | POA: Diagnosis not present

## 2023-04-25 LAB — THYROID PANEL WITH TSH
Free Thyroxine Index: 2.6 (ref 1.2–4.9)
T3 Uptake Ratio: 31 % (ref 24–39)
T4, Total: 8.5 ug/dL (ref 4.5–12.0)
TSH: <0.005 u[IU]/mL — ABNORMAL LOW (ref 0.450–4.500)

## 2023-04-25 NOTE — Progress Notes (Signed)
Sydney Bailey,   TSH showing suppressed but free and uptake are in normal range.   Key,   Please [facility] these [facility] for [facility] adjustment on her compounded thyroid medication.

## 2023-05-05 ENCOUNTER — Other Ambulatory Visit: Payer: Self-pay | Admitting: Physician Assistant

## 2023-05-05 DIAGNOSIS — L299 Pruritus, unspecified: Secondary | ICD-10-CM

## 2023-06-21 DIAGNOSIS — Z13820 Encounter for screening for osteoporosis: Secondary | ICD-10-CM | POA: Diagnosis not present

## 2023-07-08 ENCOUNTER — Other Ambulatory Visit: Payer: Self-pay | Admitting: Physician Assistant

## 2023-07-08 DIAGNOSIS — F3342 Major depressive disorder, recurrent, in full remission: Secondary | ICD-10-CM

## 2023-07-11 ENCOUNTER — Telehealth: Payer: Self-pay | Admitting: Cardiology

## 2023-07-11 DIAGNOSIS — R002 Palpitations: Secondary | ICD-10-CM

## 2023-07-11 MED ORDER — METOPROLOL SUCCINATE ER 25 MG PO TB24: ORAL_TABLET | ORAL | 1 refills | Status: DC

## 2023-07-11 NOTE — Telephone Encounter (Signed)
°*  STAT* If patient is at the pharmacy, call can be transferred to refill team. ° ° °1. Which medications need to be refilled? (please list name of each medication and dose if known) metoprolol succinate (TOPROL-XL) 25 MG 24 hr tablet ° °2. Which pharmacy/location (including street and city if local pharmacy) is medication to be sent to? [id number] [place], [place] - [address] ° °3. Do they need a 30 day or 90 day supply? 90 ° °

## 2023-07-11 NOTE — Telephone Encounter (Signed)
 Pt's medication was sent to pt's pharmacy as requested. Confirmation received.

## 2023-07-16 ENCOUNTER — Encounter: Payer: Self-pay | Admitting: Physician Assistant

## 2023-08-12 ENCOUNTER — Other Ambulatory Visit: Payer: Self-pay | Admitting: Physician Assistant

## 2023-08-12 MED ORDER — AZITHROMYCIN 250 MG PO TABS: ORAL_TABLET | ORAL | 0 refills | Status: DC

## 2023-08-12 NOTE — Progress Notes (Signed)
 Just arrived home from [place] with URI symptoms for greater than a week. Persistent sinus pressure and headache.   Zpak sent.

## 2023-08-15 ENCOUNTER — Encounter: Payer: Self-pay | Admitting: Physician Assistant

## 2023-09-12 DIAGNOSIS — Z6823 Body mass index (BMI) 23.0-23.9, adult: Secondary | ICD-10-CM | POA: Diagnosis not present

## 2023-09-12 DIAGNOSIS — J018 Other acute sinusitis: Secondary | ICD-10-CM | POA: Diagnosis not present

## 2023-09-12 DIAGNOSIS — J029 Acute pharyngitis, unspecified: Secondary | ICD-10-CM | POA: Diagnosis not present

## 2023-09-19 ENCOUNTER — Other Ambulatory Visit: Payer: Self-pay | Admitting: Physician Assistant

## 2023-09-19 DIAGNOSIS — Z780 Asymptomatic menopausal state: Secondary | ICD-10-CM

## 2023-09-19 DIAGNOSIS — R7989 Other specified abnormal findings of blood chemistry: Secondary | ICD-10-CM

## 2023-09-19 NOTE — Telephone Encounter (Signed)
 Copied from CRM [id number]. Topic: Clinical - Medication Refill >> Sep 19, 2023  3:21 PM Valeri Gate H wrote: Most Recent Primary Care Visit:  Provider: Araceli Knight  Department: [facility]  Visit Type: MYCHART VIDEO VISIT  Date: 04/20/2023  Medication: AMBULATORY NON FORMULARY MEDICATION(T4 200 mcg and T3 51 mcg NON PORCINE SR capsules) AMBULATORY NON FORMULARY MEDICATION(Naltrexone 3mg ) AMBULATORY NON FORMULARY MEDICATION(Progesterone capsules) NONFORMULARY OR COMPOUNDED ITEM(Estradiol 4 mg/ml Cream)   Has the patient contacted their pharmacy? Yes, pharmacy said they have sent over requests but haven't gotten a response (Agent: If no, request that the patient contact the pharmacy for the refill. If patient does not wish to contact the pharmacy document the reason why and proceed with request.) (Agent: If yes, when and what did the pharmacy advise?)  Is this the correct pharmacy for this prescription? Yes If no, delete pharmacy and type the correct one.  This is the patient's preferred pharmacy:   [facility] - [place], [place] - [address] [facility] [address] [place] [address] Phone: [phone] Fax: [phone]    Has the prescription been filled recently? No  Is the patient out of the medication? No  Has the patient been seen for an appointment in the last year OR does the patient have an upcoming appointment? Yes  Can we respond through MyChart? Yes  Agent: Please be advised that Rx refills may take up to 3 business days. We ask that you follow-up with your pharmacy.

## 2023-09-20 NOTE — Telephone Encounter (Signed)
 Requesting rx rf of  TT4 and T3 51mcg  NON PORCINE Cap- last written 06/20/2022 Naltraxone 3mg  - last written 06/20/2022 Progesterone capsules- last written 06/20/2022 Estradiol 4mg /ml cream last written 12/27/2021 Last OV 04/20/2023 video visit Upcoming appt = none

## 2023-09-24 IMAGING — CT CT RENAL STONE PROTOCOL
2 of 4 series · 16 of 46 positions shown, 18 images · non-contrast
Comparison: None.

CLINICAL DATA: Left-sided flank pain for several hours, initial
encounter

EXAM:
CT ABDOMEN AND PELVIS WITHOUT CONTRAST
TECHNIQUE: Multidetector CT imaging of the abdomen and pelvis was performed
following the standard protocol without IV contrast.

[Series 2: stone full · axial · 0.68mm/px · z∈[+132,+562]mm · 13 of 94 slices shown, 15 images]
[im 4/94  soft-tissue]
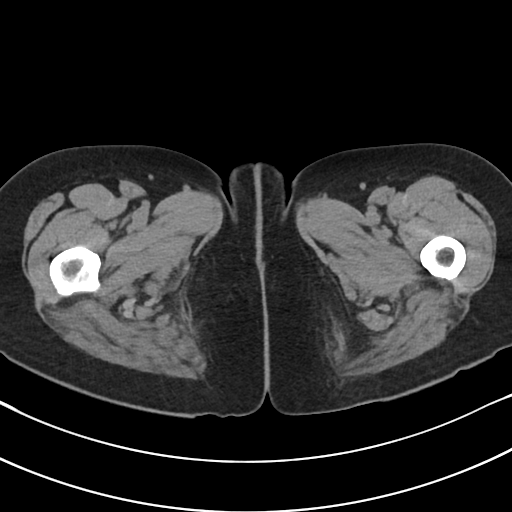
[im 4/94  bone]
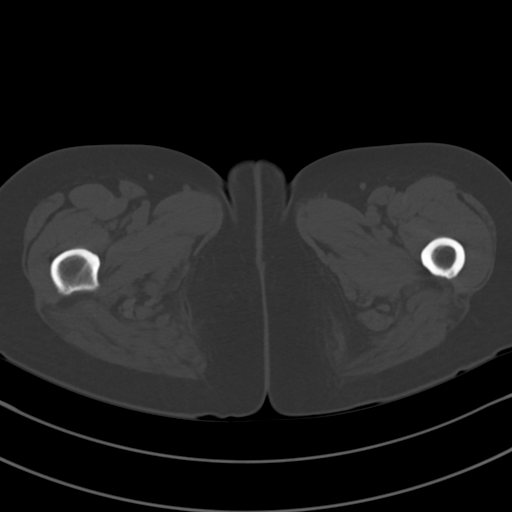
[im 12/94  soft-tissue]
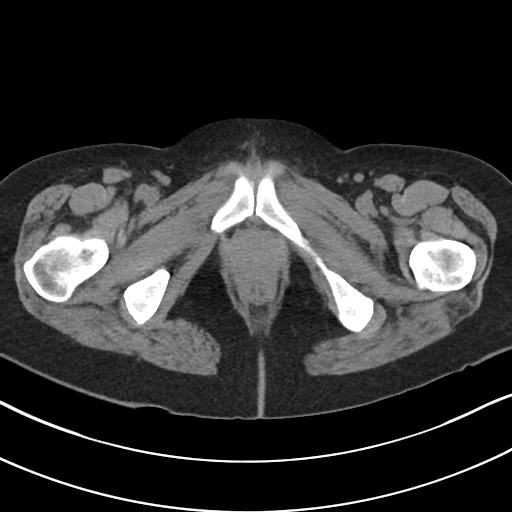
[im 20/94  soft-tissue]
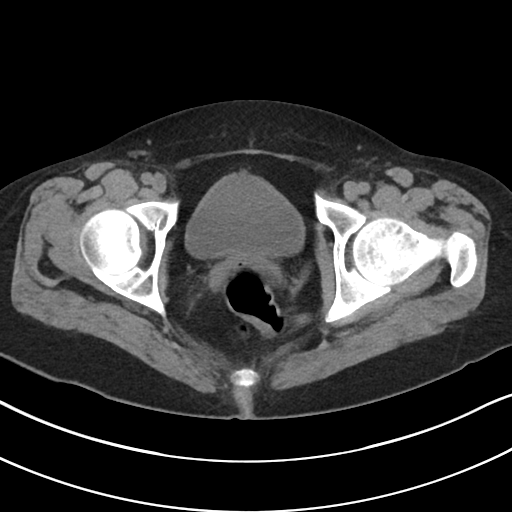
[im 28/94  soft-tissue]
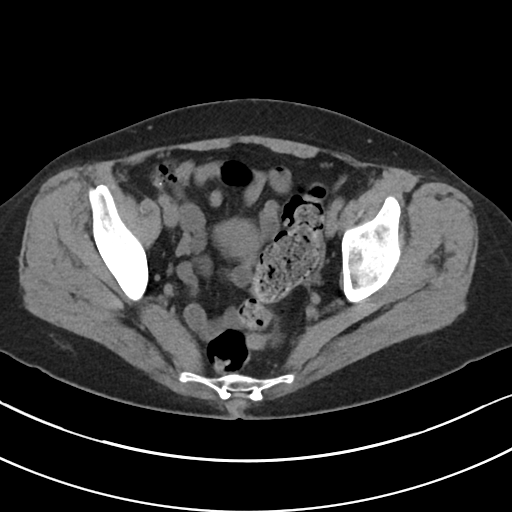
[im 32/94  soft-tissue]
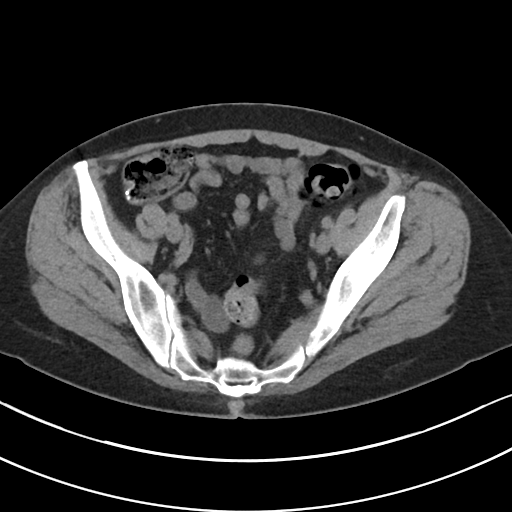
[im 39/94  soft-tissue]
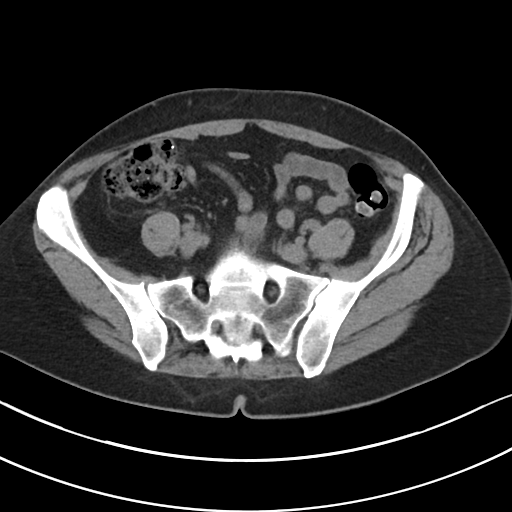
[im 47/94  soft-tissue]
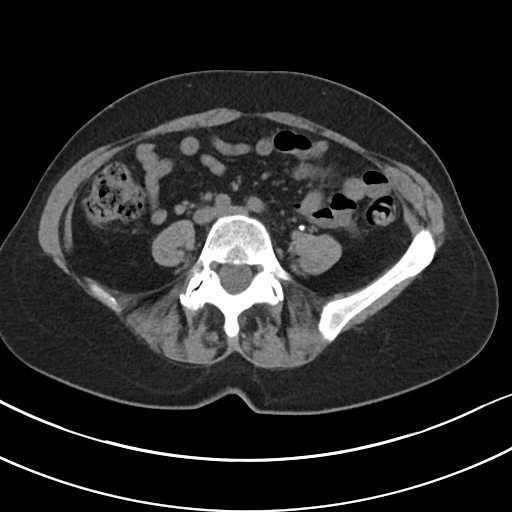
[im 55/94  soft-tissue]
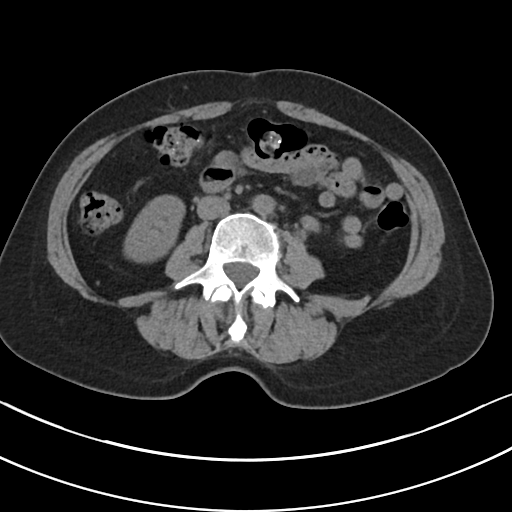
[im 63/94  soft-tissue]
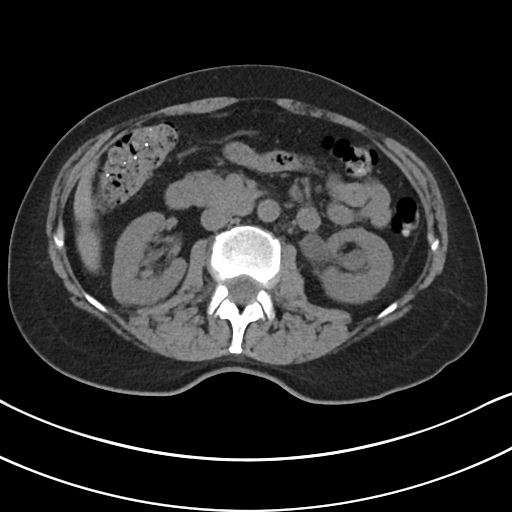
[im 63/94  bone]
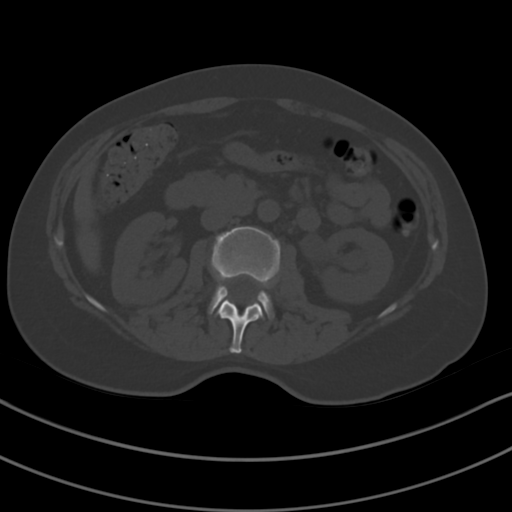
[im 66/94  soft-tissue]
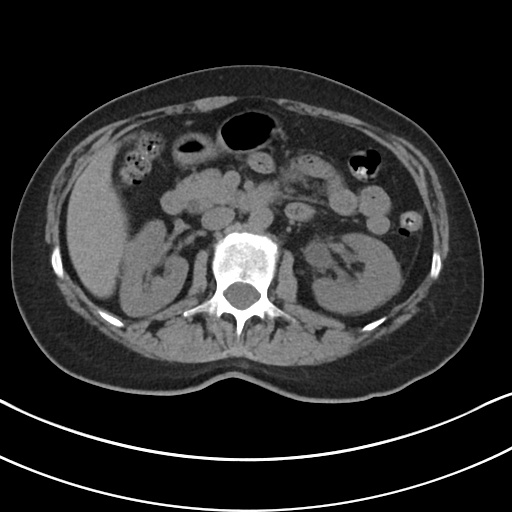
[im 74/94  soft-tissue]
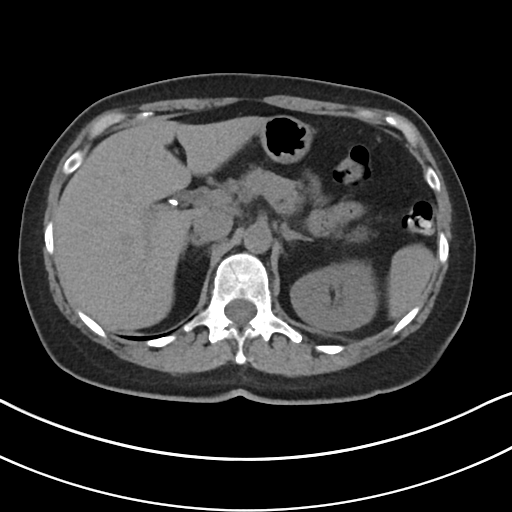
[im 82/94  soft-tissue]
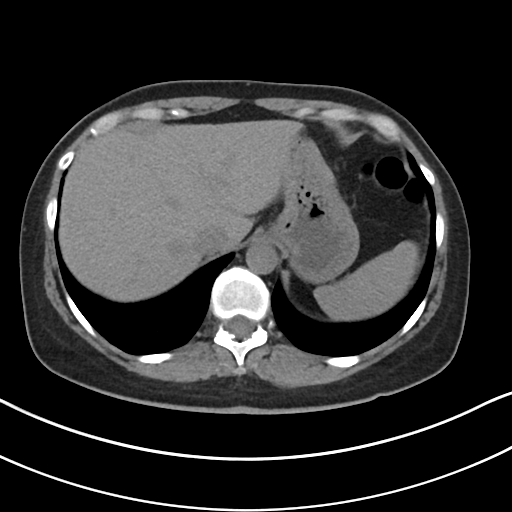
[im 90/94  soft-tissue]
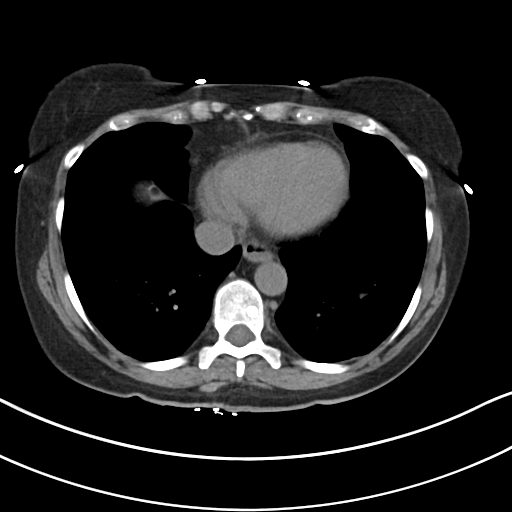

[Series 5: coronal · coronal · 0.72mm/px · 3 of 84 slices shown]
[im 28/84  soft-tissue]
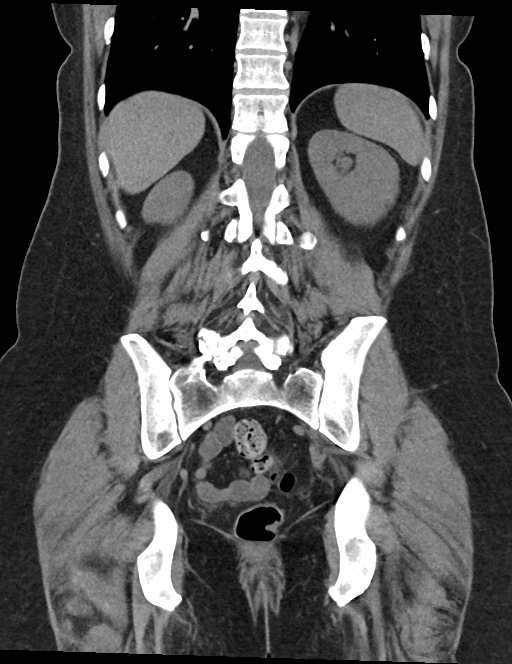
[im 37/84  soft-tissue]
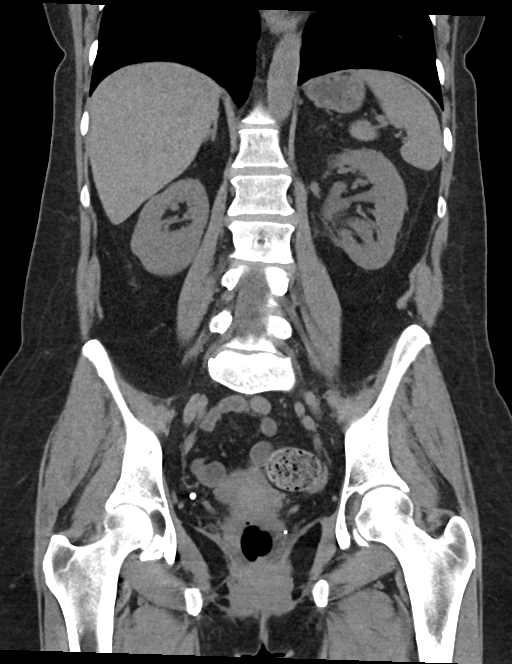
[im 47/84  soft-tissue]
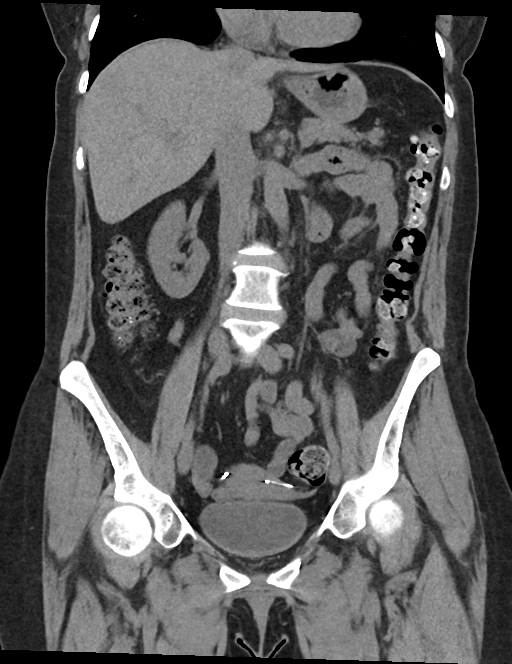

[16 of 46 positions shown; findings below may reference images not displayed]

FINDINGS: Lower chest: No acute abnormality.

Hepatobiliary: No focal liver abnormality is seen. Status post
cholecystectomy. No biliary dilatation.

Pancreas: Unremarkable. No pancreatic ductal dilatation or
surrounding inflammatory changes.

Spleen: Normal in size without focal abnormality.

Adrenals/Urinary Tract: Adrenal glands are within normal limits.
Right kidney shows no renal calculi or obstructive changes. Left
kidney demonstrates mild hydronephrosis and proximal hydroureter
which extends to the mid ureter secondary to 3-4 mm obstructing
stone. More distal left ureter is within normal limits. The bladder
is within normal limits as well.

Stomach/Bowel: No obstructive or inflammatory changes of the colon
are seen. Mild diverticular change without diverticulitis is noted.
The appendix is not well visualized and may have been surgically
removed. No inflammatory changes are seen. Small bowel and stomach
appear within normal limits.

Vascular/Lymphatic: No significant vascular findings are present. No
enlarged abdominal or pelvic lymph nodes.

Reproductive: Uterus is within normal limits. Bilateral Essure coils
are noted and stable. No adnexal mass is seen.

Other: No abdominal wall hernia or abnormality. No abdominopelvic
ascites.

Musculoskeletal: No acute or significant osseous findings.
IMPRESSION: 3-4 mm left mid ureteral stone with obstructive change.

Diverticulosis without diverticulitis.

No other focal abnormality is seen.

## 2023-09-24 MED ORDER — AMBULATORY NON FORMULARY MEDICATION: 0 refills | Status: DC

## 2023-09-24 MED ORDER — NONFORMULARY OR COMPOUNDED ITEM: 8.0000 mg | Freq: Every day | 3 refills | Status: DC

## 2023-09-25 ENCOUNTER — Other Ambulatory Visit: Payer: Self-pay

## 2023-09-25 ENCOUNTER — Ambulatory Visit: Payer: Self-pay

## 2023-09-25 DIAGNOSIS — Z780 Asymptomatic menopausal state: Secondary | ICD-10-CM

## 2023-09-25 DIAGNOSIS — R7989 Other specified abnormal findings of blood chemistry: Secondary | ICD-10-CM

## 2023-09-25 MED ORDER — NONFORMULARY OR COMPOUNDED ITEM
8.0000 mg | Freq: Every day | 3 refills | Status: AC
Start: 2023-09-25 — End: ?

## 2023-09-25 MED ORDER — AMBULATORY NON FORMULARY MEDICATION: 0 refills | Status: DC

## 2023-09-25 NOTE — Telephone Encounter (Signed)
 Fax resent to [facility] at fax # [phone]

## 2023-09-25 NOTE — Telephone Encounter (Signed)
 Confirmation page received  Patient informed.

## 2023-09-25 NOTE — Telephone Encounter (Signed)
 Please see note below relayed to patient access agent pertaining to refills.   Copied from CRM [id number]. Topic: Clinical - Prescription Issue >> Sep 25, 2023 12:03 PM Tisa Forester wrote: Reason for CRM: patient called pharmacy and pharmacy need Sandy Crumb to verbally call pharmacy to get refill pharmacy do not have the medication refill yet   AMBULATORY NON FORMULARY MEDICATION(T4 200 mcg and T3 51 mcg NON PORCINE SR capsules)  AMBULATORY NON FORMULARY MEDICATION(Naltrexone 3mg )  AMBULATORY NON FORMULARY MEDICATION(Progesterone  capsules)  NONFORMULARY OR COMPOUNDED ITEM(Estradiol  4 mg/ml Cream)  [facility] - [place], [place] - [address] [facility] [address] Reed [address] [place] [address] Phone: [phone] Fax: [phone] Hours: Not open 24 hours    Please contact patient at [phone] after call the pharmacy need to confirm if provider reached out to pharmacy , patient stated there is a delay everytime now she is out of her medication Answer Assessment - Initial Assessment Questions 1. REASON FOR CALL or QUESTION: "What is your reason for calling today?" or "How can I best help you?" or "What question do you have that I can help answer?"     Please see note 2. CALLER: Document the source of call. (e.g., laboratory, patient).     Patient  Protocols used: PCP Call - No Triage-A-AH

## 2023-10-10 NOTE — Progress Notes (Signed)
 HPI: FU palpitations. Echo 4/18 showed normal LV function. Holter 4/18 showed sinus brady, sinus, sinus tach, PVCs and rare couplet. Since last seen, she has dyspnea with more vigorous activities.  No orthopnea, PND, pedal edema, exertional chest pain or syncope.  Occasional palpitations unchanged.  Current Outpatient Medications  Medication Sig Dispense Refill   ALPRAZolam  (XANAX ) 0.5 MG tablet Take 0.5-1 tablet up to three times daily as needed for anxiety 20 tablet 1   AMBULATORY NON FORMULARY MEDICATION Sig: T4 200 mcg and T3 51 mcg NON PORCINE SR capsules take one tablet daily 90 capsule 0   AMBULATORY NON FORMULARY MEDICATION Naltrexone 3mg  one tablet daily. 90 tablet 0   AMBULATORY NON FORMULARY MEDICATION Progesterone  capsules Take two tablets 100mg  SR progesterone  capsules daily at bedtime, 4 days off per month  Estradiol  4 mg/ml apply 3 clicks to inner thighs once daily as directed, 4 days off per month 180 capsule 0   citalopram  (CELEXA ) 40 MG tablet TAKE 1 TABLET BY MOUTH EVERY DAY 90 tablet 3   metaxalone  (SKELAXIN ) 800 MG tablet Take 1 tablet (800 mg total) by mouth 3 (three) times daily as needed for muscle spasms. 90 tablet 1   metoprolol  succinate (TOPROL -XL) 25 MG 24 hr tablet 1/2 tablet twice daily 90 tablet 1   NONFORMULARY OR COMPOUNDED ITEM Apply 8-12 mg topically at bedtime. Estradiol  4 mg/ml Cream 1 each 3   ondansetron  (ZOFRAN -ODT) 8 MG disintegrating tablet Take 1 tablet (8 mg total) by mouth every 8 (eight) hours as needed for nausea. 20 tablet 1   Probiotic Product (PROBIOTIC PO) Take 1 capsule by mouth daily.     rizatriptan  (MAXALT -MLT) 10 MG disintegrating tablet TAKE 1 TABLET (10 MG TOTAL) BY MOUTH ONCE AS NEEDED FOR MIGRAINE. MAY REPEAT IN 2 HOURS IF NEEDED. 12 tablet 5   azithromycin  (ZITHROMAX  Z-PAK) 250 MG tablet Take 2 tablets (500 mg) on  Day 1,  followed by 1 tablet (250 mg) once daily on Days 2 through 5. (Patient not taking: Reported on 10/24/2023) 6  tablet 0   hydrOXYzine  (ATARAX ) 10 MG tablet TAKE 1 TABLET (10 MG TOTAL) BY MOUTH 2 (TWO) TIMES DAILY AS NEEDED FOR ITCHING. (Patient not taking: Reported on 10/24/2023) 180 tablet 1   traZODone  (DESYREL ) 50 MG tablet TAKE 1/2 TABLET TO 2 TABLET BY MOUTH AT BEDTIME AS NEEDED FOR SLEEP (Patient not taking: Reported on 10/24/2023) 180 tablet 1   No current facility-administered medications for this visit.     Past Medical History:  Diagnosis Date   Allergy    Anxiety    Depression    Family history of anesthesia complication    Father had difficult time waking up    Heart palpitations    PVC's   History of kidney stones    Hypothyroidism 04/2007   started post- partum   Migraines    PONV (postoperative nausea and vomiting)    Difficult waking up; had bad migraine after bunionectomy   PVC's (premature ventricular contractions)    Pt saw Dr. Audery Blazing for this but pt stated it was fine    Past Surgical History:  Procedure Laterality Date   BREAST REDUCTION SURGERY Bilateral 11/02/2021   Procedure: MAMMARY REDUCTION  (BREAST) WITH LIPOSUCTION;  Surgeon: Thornell Flirt, DO;  Location: [facility] OR;  Service: Plastics;  Laterality: Bilateral;  3 hours   BUNIONECTOMY Bilateral    x 2 on both feet   CHOLECYSTECTOMY N/A 05/22/2013   Procedure: LAPAROSCOPIC  CHOLECYSTECTOMY;  Surgeon: Rogena Class, MD;  Location: [facility] OR;  Service: General;  Laterality: N/A;   essure BTL     UPPER GI ENDOSCOPY      Social History   Socioeconomic History   Marital status: Married    Spouse name: Not on file   Number of children: 3   Years of education: Not on file   Highest education level: Not on file  Occupational History   Occupation: Print production planner  Tobacco Use   Smoking status: Never   Smokeless tobacco: Never  Vaping Use   Vaping status: Never Used  Substance and Sexual Activity   Alcohol use: No   Drug use: No   Sexual activity: Not on file  Other Topics Concern   Not on file   Social History Narrative   Not on file   Social Drivers of Health   Financial Resource Strain: Not on file  Food Insecurity: Not on file  Transportation Needs: Not on file  Physical Activity: Not on file  Stress: Not on file  Social Connections: Not on file  Intimate Partner Violence: Not on file    Family History  Problem Relation Age of Onset   Heart disease Father        Atrial fibrillation   Atrial fibrillation Father    Atrial fibrillation Mother    Cancer Maternal Aunt        breast   Breast cancer Maternal Aunt    Cancer Paternal Aunt        non hodgkins lymphoma   Cancer Maternal Grandmother        breast   Breast cancer Maternal Grandmother     ROS: no fevers or chills, productive cough, hemoptysis, dysphasia, odynophagia, melena, hematochezia, dysuria, hematuria, rash, seizure activity, orthopnea, PND, pedal edema, claudication. Remaining systems are negative.  Physical Exam: Well-developed well-nourished in no acute distress.  Skin is warm and dry.  HEENT is normal.  Neck is supple.  Chest is clear to auscultation with normal expansion.  Cardiovascular exam is regular rate and rhythm.  Abdominal exam nontender or distended. No masses palpated. Extremities show no edema. neuro grossly intact  EKG Interpretation Date/Time:  Wednesday Oct 24 2023 15:24:43 EDT Ventricular Rate:  75 PR Interval:  166 QRS Duration:  82 QT Interval:  370 QTC Calculation: 413 R Axis:   71  Text Interpretation: Normal sinus rhythm Normal ECG Confirmed by Alexandria Angel ([id number]) on 10/24/2023 3:25:39 PM    A/P  1 palpitations-symptoms are controlled at present.  Will continue beta-blocker at present dose.  2 history of PVCs-follow-up echocardiogram showed normal LV function.  Continue beta-blocker.  Alexandria Angel, MD

## 2023-10-19 DIAGNOSIS — F4323 Adjustment disorder with mixed anxiety and depressed mood: Secondary | ICD-10-CM | POA: Diagnosis not present

## 2023-10-19 IMAGING — US US THYROID
1 series · 13 of 25 positions shown · non-contrast
Comparison: None.

CLINICAL DATA: Abnormal thyroid labs

EXAM:
THYROID ULTRASOUND
TECHNIQUE: Ultrasound examination of the thyroid gland and adjacent soft
tissues was performed.

[Series 1: us thyroid · 41 acquisitions, 13 frames shown]
[im 1/41]
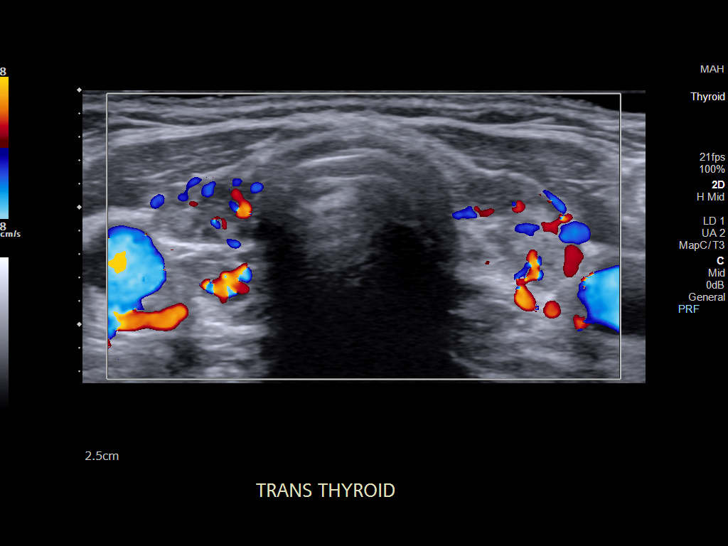
[im 4/41]
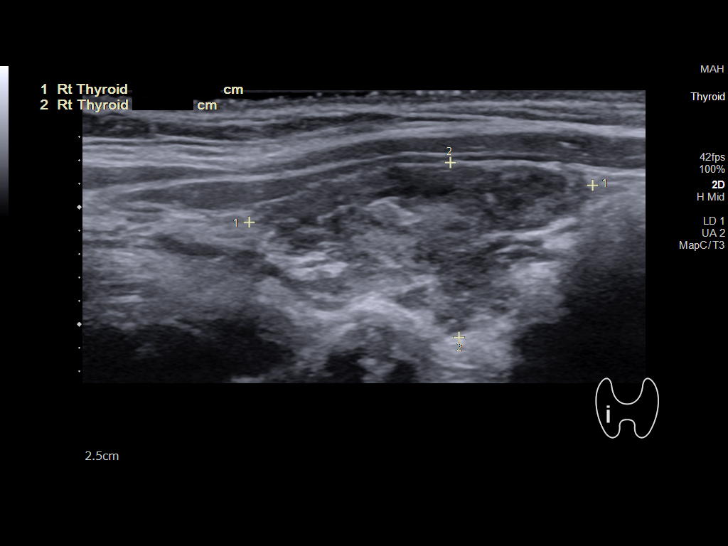
[im 7/41]
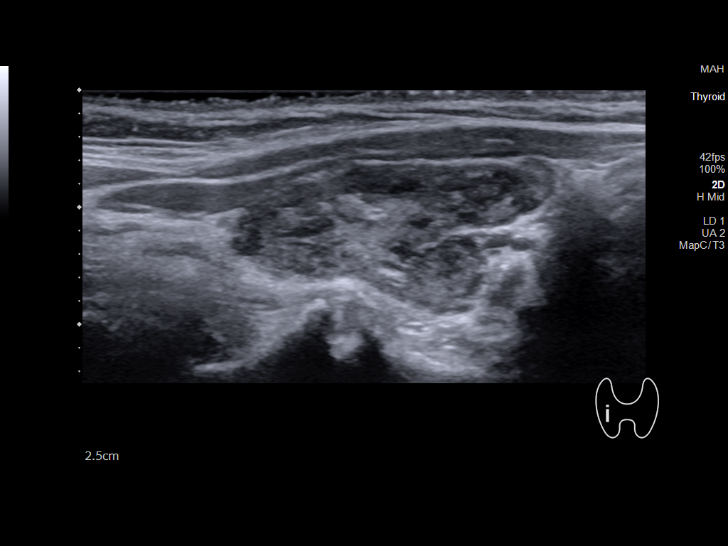
[im 11/41]
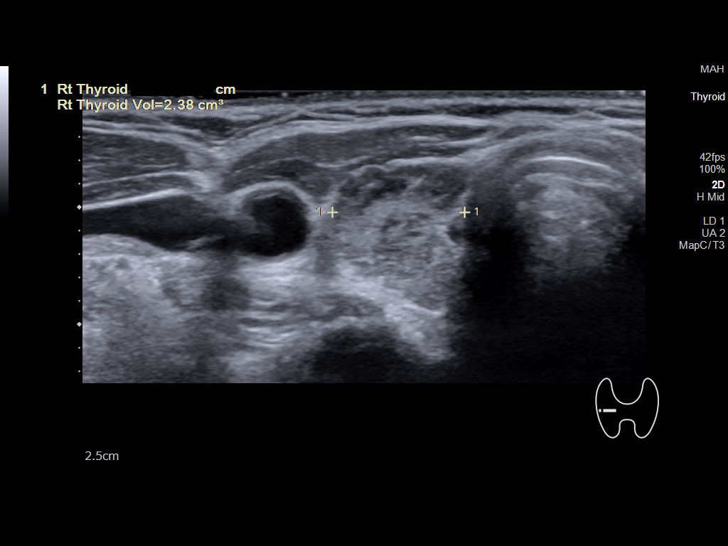
[im 14/41]
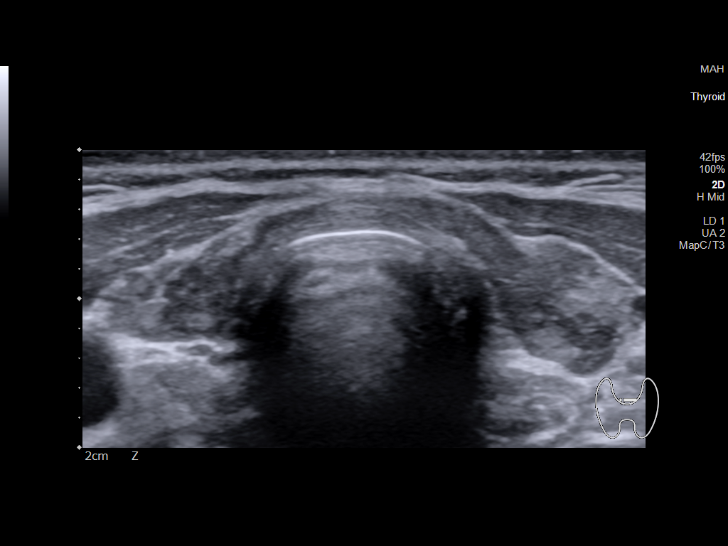
[im 17/41]
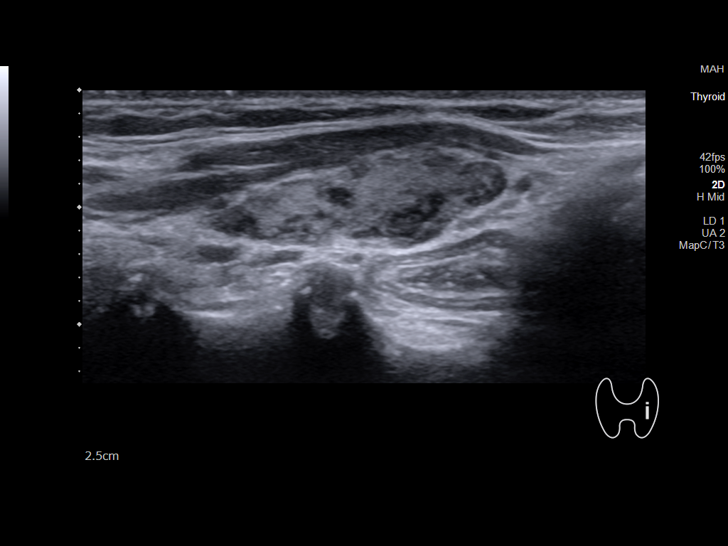
[im 21/41]
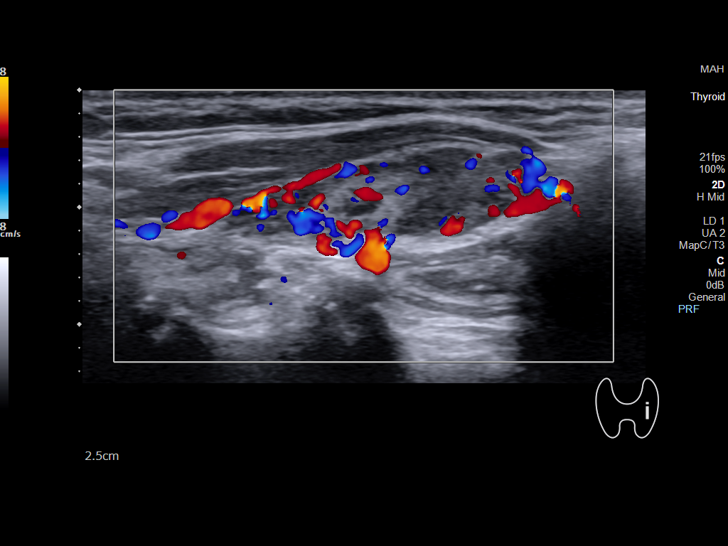
[im 24/41]
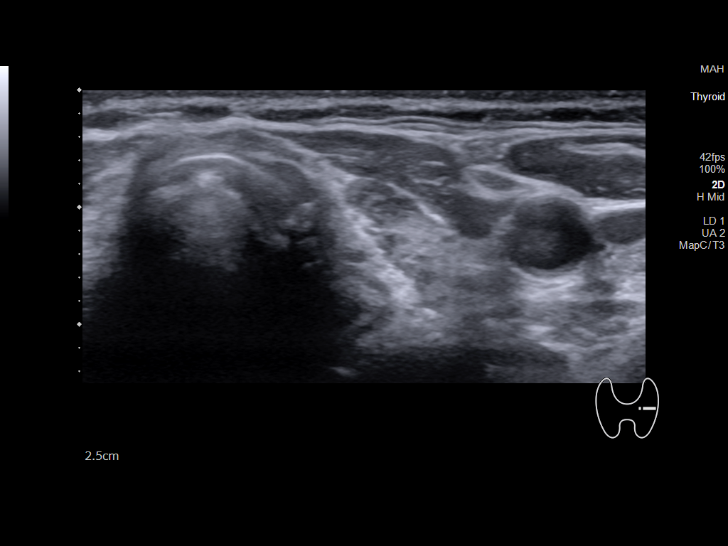
[im 27/41]
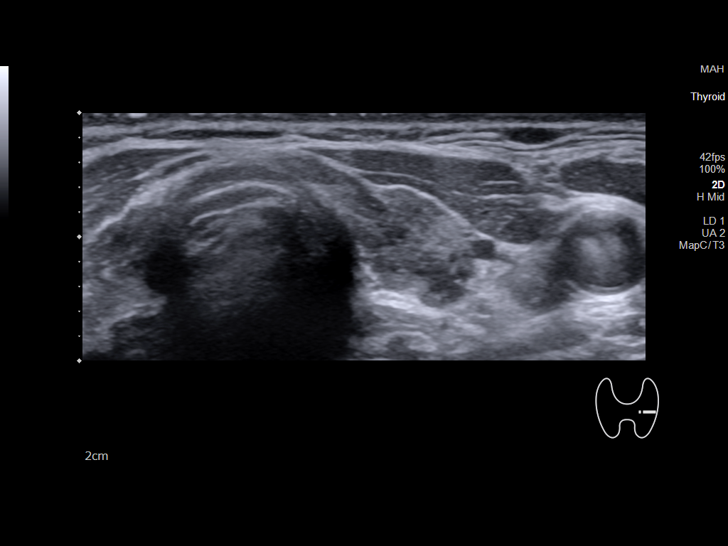
[im 31/41]
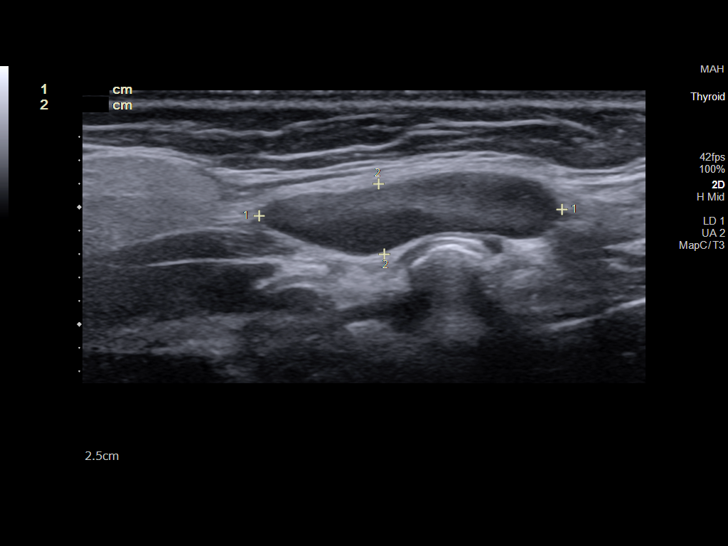
[im 34/41]
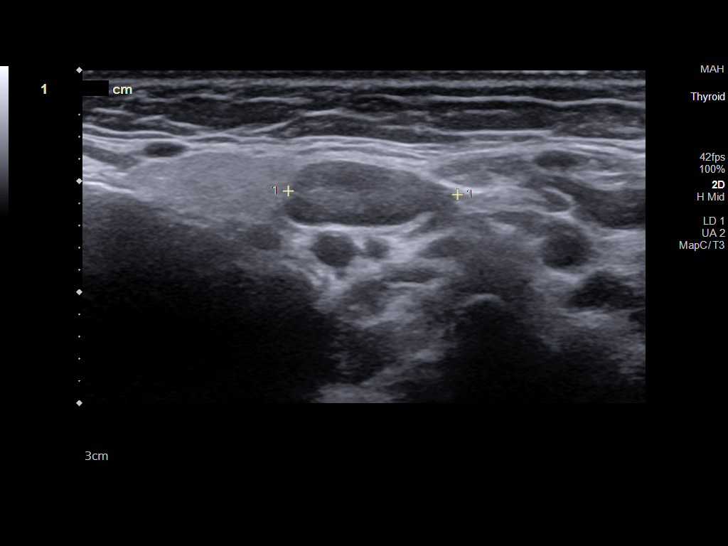
[im 37/41]
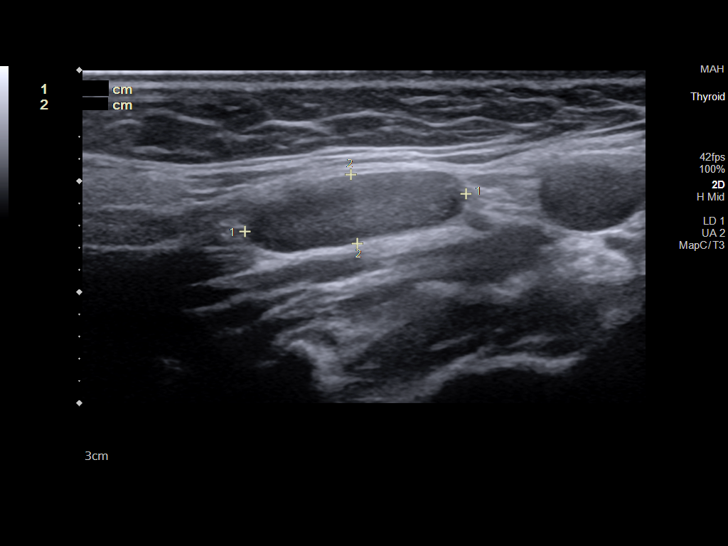
[im 41/41]
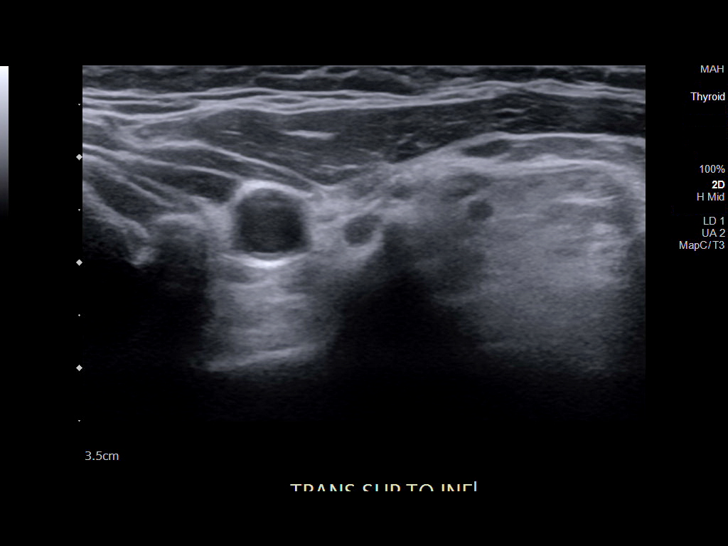

[13 of 25 positions shown; findings below may reference images not displayed]

FINDINGS: Parenchymal Echotexture: Markedly heterogenous

Isthmus: 0.1 cm

Right lobe: 3.0 x 1.5 x 1.1 cm

Left lobe: 2.7 x 0.9 x 1.0 cm

_________________________________________________________

Estimated total number of nodules >/= 1 cm: 0

Number of spongiform nodules >/=  2 cm not described below (TR1): 0

Number of mixed cystic and solid nodules >/= 1.5 cm not described
below (TR2): 0

_________________________________________________________

Markedly heterogeneous thyroid gland without discrete nodule
identified. The thyroid gland appears hypervascular.

Other: Prominent bilateral cervical lymph nodes measuring less than
1.0 cm in the short axis. These lymph nodes appear to demonstrate
normal morphology with preserved fatty hilum.
IMPRESSION: 1. The thyroid gland appears slightly shrunken in size with markedly
heterogeneous and hypervascular parenchyma. Findings suggest sequela
of previous and/or ongoing thyroiditis.
2. No discrete thyroid nodule identified.
3. Prominent bilateral cervical lymph nodes measure within normal
limits with preserved morphology, favored reactive. Correlate
clinically.

## 2023-10-24 ENCOUNTER — Encounter: Payer: Self-pay | Admitting: Cardiology

## 2023-10-24 ENCOUNTER — Ambulatory Visit (INDEPENDENT_AMBULATORY_CARE_PROVIDER_SITE_OTHER): Payer: Self-pay | Admitting: Cardiology

## 2023-10-24 VITALS — BP 118/60 | HR 76 | Ht 66.0 in | Wt 151.0 lb

## 2023-10-24 DIAGNOSIS — R002 Palpitations: Secondary | ICD-10-CM | POA: Diagnosis not present

## 2023-10-24 DIAGNOSIS — I493 Ventricular premature depolarization: Secondary | ICD-10-CM | POA: Diagnosis not present

## 2023-10-24 MED ORDER — METOPROLOL SUCCINATE ER 25 MG PO TB24: ORAL_TABLET | ORAL | 3 refills | Status: AC

## 2023-10-24 NOTE — Patient Instructions (Signed)

## 2023-11-06 DIAGNOSIS — F4323 Adjustment disorder with mixed anxiety and depressed mood: Secondary | ICD-10-CM | POA: Diagnosis not present

## 2023-11-22 DIAGNOSIS — F4323 Adjustment disorder with mixed anxiety and depressed mood: Secondary | ICD-10-CM | POA: Diagnosis not present

## 2023-11-29 DIAGNOSIS — F4323 Adjustment disorder with mixed anxiety and depressed mood: Secondary | ICD-10-CM | POA: Diagnosis not present

## 2023-12-02 ENCOUNTER — Encounter: Payer: Self-pay | Admitting: Physician Assistant

## 2023-12-03 NOTE — Telephone Encounter (Signed)
 Okay to wait until Vermell gets back to discuss lab orders.

## 2023-12-03 NOTE — Telephone Encounter (Signed)
 Would strongly recommend that she make an appointment to discuss her fatigue so Vermell can best assess which labs would be appropriate instead of just guessing.

## 2023-12-10 ENCOUNTER — Other Ambulatory Visit: Payer: Self-pay | Admitting: Physician Assistant

## 2023-12-10 ENCOUNTER — Telehealth (INDEPENDENT_AMBULATORY_CARE_PROVIDER_SITE_OTHER): Payer: Self-pay | Admitting: Physician Assistant

## 2023-12-10 ENCOUNTER — Encounter: Payer: Self-pay | Admitting: Physician Assistant

## 2023-12-10 VITALS — Ht 66.0 in | Wt 150.0 lb

## 2023-12-10 DIAGNOSIS — R5383 Other fatigue: Secondary | ICD-10-CM | POA: Diagnosis not present

## 2023-12-10 DIAGNOSIS — M79622 Pain in left upper arm: Secondary | ICD-10-CM

## 2023-12-10 DIAGNOSIS — Z79890 Hormone replacement therapy (postmenopausal): Secondary | ICD-10-CM

## 2023-12-10 DIAGNOSIS — Z780 Asymptomatic menopausal state: Secondary | ICD-10-CM

## 2023-12-10 DIAGNOSIS — E039 Hypothyroidism, unspecified: Secondary | ICD-10-CM

## 2023-12-10 DIAGNOSIS — E559 Vitamin D deficiency, unspecified: Secondary | ICD-10-CM | POA: Diagnosis not present

## 2023-12-10 DIAGNOSIS — R7989 Other specified abnormal findings of blood chemistry: Secondary | ICD-10-CM

## 2023-12-10 NOTE — Progress Notes (Unsigned)
..  Virtual Visit via Video Note  I connected with Sydney Bailey on 12/11/23 at  2:20 PM EDT by a video enabled telemedicine application and verified that I am speaking with the correct person using two identifiers.  Location: Patient: work Provider: clinic  .SABRAParticipating in visit:  Patient: Sydney Bailey Provider: Vermell Bologna PA-C   I discussed the limitations of evaluation and management by telemedicine and the availability of in person appointments. The patient expressed understanding and agreed to proceed.  History of Present Illness: Pt is a 58 yo female with hypothyroidism who presents to the clinic to discuss new onset fatigue about 3-4 weeks ago. She went on a trip to visit her grandson and when she came back she was very tired. No other sick symptoms. At times she felt like she could not even lift her head. She slept for the next week and felt better but would still have episodes of extereme fatigue. She feels similar to when her thyroid  levels are off. She is on HRT as well. She would like labs. She is sleeping well. She is a little stressed but no significant depression or anxiety. No medication changes. She is taking a new probiotic.   She also c/o left axilla tenderness for the last week. She does not feel any masses. She denies any injury or trauma. Mammogram done earlier this year and negative for malignancy. No fever, chills, night sweats, body aches.    Observations/Objective: No acute distress Normal mood and appearance Normal breathing   Assessment and Plan: SABRASABRASheryle was seen today for medical management of chronic issues.  Diagnoses and all orders for this visit:  Hypothyroidism, unspecified type  No energy  Hormone replacement therapy (HRT) -     Estradiol  -     Progesterone   Tenderness of left axilla   I had already ordered TSH/CBC/iron panel/vitamin D /b12/folate/cmp and added estradiol  and progesterone  to check HRT. Will adjust medications  accordingly No red flags with tenderness of left axilla Will continue to monitor if not improving in next week or so please call us  back Mammogram done this year per patient and normal-will request copy Cbc ordered today to look at WBC   Follow Up Instructions:    I discussed the assessment and treatment plan with the patient. The patient was provided an opportunity to ask questions and all were answered. The patient agreed with the plan and demonstrated an understanding of the instructions.   The patient was advised to call back or seek an in-person evaluation if the symptoms worsen or if the condition fails to improve as anticipated.   Menelik Mcfarren, PA-C

## 2023-12-11 ENCOUNTER — Other Ambulatory Visit: Payer: Self-pay | Admitting: Physician Assistant

## 2023-12-11 ENCOUNTER — Encounter: Payer: Self-pay | Admitting: Physician Assistant

## 2023-12-11 ENCOUNTER — Ambulatory Visit: Payer: Self-pay | Admitting: Physician Assistant

## 2023-12-11 ENCOUNTER — Telehealth: Payer: Self-pay | Admitting: Physician Assistant

## 2023-12-11 DIAGNOSIS — Z79890 Hormone replacement therapy (postmenopausal): Secondary | ICD-10-CM | POA: Insufficient documentation

## 2023-12-11 DIAGNOSIS — M79622 Pain in left upper arm: Secondary | ICD-10-CM | POA: Insufficient documentation

## 2023-12-11 LAB — CBC WITH DIFFERENTIAL/PLATELET
Basophils Absolute: 0 x10E3/uL (ref 0.0–0.2)
Basos: 1 %
EOS (ABSOLUTE): 0.1 x10E3/uL (ref 0.0–0.4)
Eos: 1 %
Hematocrit: 40.9 % (ref 34.0–46.6)
Hemoglobin: 13.9 g/dL (ref 11.1–15.9)
Immature Grans (Abs): 0 x10E3/uL (ref 0.0–0.1)
Immature Granulocytes: 0 %
Lymphocytes Absolute: 2.3 x10E3/uL (ref 0.7–3.1)
Lymphs: 32 %
MCH: 31.4 pg (ref 26.6–33.0)
MCHC: 34 g/dL (ref 31.5–35.7)
MCV: 93 fL (ref 79–97)
Monocytes Absolute: 0.6 x10E3/uL (ref 0.1–0.9)
Monocytes: 8 %
Neutrophils Absolute: 4.3 x10E3/uL (ref 1.4–7.0)
Neutrophils: 58 %
Platelets: 275 x10E3/uL (ref 150–450)
RBC: 4.42 x10E6/uL (ref 3.77–5.28)
RDW: 11.9 % (ref 11.7–15.4)
WBC: 7.4 x10E3/uL (ref 3.4–10.8)

## 2023-12-11 LAB — CMP14+EGFR
ALT: 12 IU/L (ref 0–32)
AST: 16 IU/L (ref 0–40)
Albumin: 4.4 g/dL (ref 3.8–4.9)
Alkaline Phosphatase: 113 IU/L (ref 44–121)
BUN/Creatinine Ratio: 12 (ref 9–23)
BUN: 6 mg/dL (ref 6–24)
Bilirubin Total: 0.3 mg/dL (ref 0.0–1.2)
CO2: 20 mmol/L (ref 20–29)
Calcium: 9.4 mg/dL (ref 8.7–10.2)
Chloride: 101 mmol/L (ref 96–106)
Creatinine, Ser: 0.52 mg/dL — ABNORMAL LOW (ref 0.57–1.00)
Globulin, Total: 2.6 g/dL (ref 1.5–4.5)
Glucose: 144 mg/dL — ABNORMAL HIGH (ref 70–99)
Potassium: 3.6 mmol/L (ref 3.5–5.2)
Sodium: 138 mmol/L (ref 134–144)
Total Protein: 7 g/dL (ref 6.0–8.5)
eGFR: 109 mL/min/1.73 (ref >59)

## 2023-12-11 LAB — THYROID PANEL WITH TSH
Free Thyroxine Index: 1.9 (ref 1.2–4.9)
T3 Uptake Ratio: 28 % (ref 24–39)
T4, Total: 6.9 ug/dL (ref 4.5–12.0)
TSH: 0.007 u[IU]/mL — ABNORMAL LOW (ref 0.450–4.500)

## 2023-12-11 LAB — VITAMIN D 25 HYDROXY (VIT D DEFICIENCY, FRACTURES): Vit D, 25-Hydroxy: 22.3 ng/mL — ABNORMAL LOW (ref 30.0–100.0)

## 2023-12-11 LAB — IRON,TIBC AND FERRITIN PANEL
Ferritin: 72 ng/mL (ref 15–150)
Iron Saturation: 25 % (ref 15–55)
Iron: 82 ug/dL (ref 27–159)
Total Iron Binding Capacity: 330 ug/dL (ref 250–450)
UIBC: 248 ug/dL (ref 131–425)

## 2023-12-11 LAB — B12 AND FOLATE PANEL
Folate: 10.1 ng/mL (ref >3.0)
Vitamin B-12: 451 pg/mL (ref 232–1245)

## 2023-12-11 LAB — ESTRADIOL: Estradiol: 78.1 pg/mL

## 2023-12-11 LAB — PROGESTERONE: Progesterone: 0.6 ng/mL

## 2023-12-11 MED ORDER — AMBULATORY NON FORMULARY MEDICATION: 0 refills | Status: DC

## 2023-12-11 MED ORDER — AMBULATORY NON FORMULARY MEDICATION: 5 refills | Status: DC

## 2023-12-11 MED ORDER — AMBULATORY NON FORMULARY MEDICATION: 1 refills | Status: DC

## 2023-12-11 NOTE — Progress Notes (Signed)
 Estrogen and progesterone  are down quite a bit from last check. Will send to [facility]. I certainly think a dose change here could be helpful.

## 2023-12-11 NOTE — Telephone Encounter (Signed)
 Sydney Bailey spoke with pharmacist at [facility]

## 2023-12-11 NOTE — Addendum Note (Signed)
 Addended by: ANTONIETTE VERMELL CROME on: 12/11/2023 12:17 PM   Modules accepted: Orders

## 2023-12-11 NOTE — Telephone Encounter (Signed)
 Faxed lab work to [facility] @ 9:57am

## 2023-12-11 NOTE — Progress Notes (Signed)
 Let patient know increased progesterone  and estradiol  with adding a testosterone  cream per pharmacy recommendation. Faxed to [facility].

## 2023-12-11 NOTE — Progress Notes (Signed)
 Sydney Bailey,   Iron looks good.  Kidney and liver look good.  B12 and folate normal.   Your TSH is low meaning your thyroid  hormones should be trending more HYPER(too much thyroid ) but ultimately your free levels are down from last check and your are feeling worse so I would be hesitant to decrease thyroid  medication any more. Based on labs if any adjustment is made would be to decrease thyroid  hormone. We can send these to [facility] as well for pharmacist to read and consult.

## 2023-12-11 NOTE — Progress Notes (Signed)
 Pharmacy suggested based on labs to increase to 4clicks of estradiol  and 3 capsules of progesterone  with testosterone  1 click.   Faxed to [facility].

## 2023-12-11 NOTE — Telephone Encounter (Signed)
 Pharmacy called.  They are needing clarification on T4 200 mcg & T3 51 mcg none porcine SR capsules.

## 2023-12-11 NOTE — Telephone Encounter (Signed)
 Please send new labs for medication adjustment for hormones and thyroid . Please check on this to make sure they get fax.

## 2023-12-12 ENCOUNTER — Other Ambulatory Visit: Payer: Self-pay

## 2023-12-12 MED ORDER — AMBULATORY NON FORMULARY MEDICATION: 3 refills | Status: DC

## 2023-12-12 NOTE — Telephone Encounter (Signed)
 Spoke with [place] at [facility].  She states that the prescription sent yesterday for the refill of AMBULATORY NON FORMULARY MEDICATION T4 200 mcg and T3 51 mcg NON PORCINE SR capsules.  Was sent as SR and in past she had gotten as IR. Wanting clarification regarding this.  Also requesting rc rf of Naltrexone 3mg  tablet Last written 09/25/2023 Last OV 04/20/2023 Upcoming appt = none

## 2023-12-12 NOTE — Telephone Encounter (Signed)
 Whatever she has gotten before. Can we can change and reprint rx so written correctly this time.

## 2023-12-12 NOTE — Telephone Encounter (Signed)
 Can we call and see what they need clarified.

## 2023-12-12 NOTE — Telephone Encounter (Signed)
 Copied from CRM [id number]. Topic: Clinical - Prescription Issue >> Dec 11, 2023  5:00 PM Mercer PEDLAR wrote: Reason for CRM: [place] calling from [facility] for clarification on prescription for AMBULATORY NON FORMULARY MEDICATION T4 200 mcg and T3 51 mcg NON PORCINE SR capsules.  Callback: [phone] (ask for [place])

## 2023-12-12 NOTE — Telephone Encounter (Signed)
 Pharmacy informed that should be IR. Prescription not yet changed in system.

## 2023-12-17 ENCOUNTER — Ambulatory Visit: Payer: Self-pay | Admitting: Physician Assistant

## 2023-12-18 DIAGNOSIS — F4323 Adjustment disorder with mixed anxiety and depressed mood: Secondary | ICD-10-CM | POA: Diagnosis not present

## 2023-12-19 ENCOUNTER — Other Ambulatory Visit: Payer: Self-pay

## 2023-12-19 MED ORDER — AMBULATORY NON FORMULARY MEDICATION: 1 refills | Status: DC

## 2023-12-20 NOTE — Telephone Encounter (Signed)
 Changed to IR on medication list in chart.

## 2023-12-24 IMAGING — CT CT RENAL STONE PROTOCOL
2 of 3 series · 17 of 46 positions shown, 19 images · non-contrast
Comparison: 04/09/2021

CLINICAL DATA: Flank pain, kidney stone suspected. Left flank pain
beginning suddenly tonight.



[Series 2: axial st · axial · 0.95mm/px · z∈[-461,-56]mm · 14 of 94 slices shown, 16 images]
[im 7/94  soft-tissue]
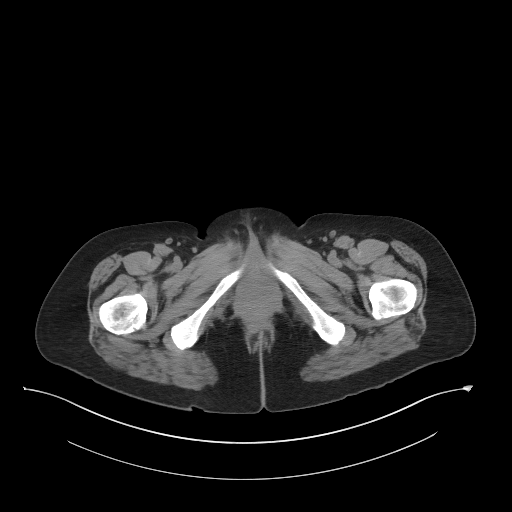
[im 7/94  bone]
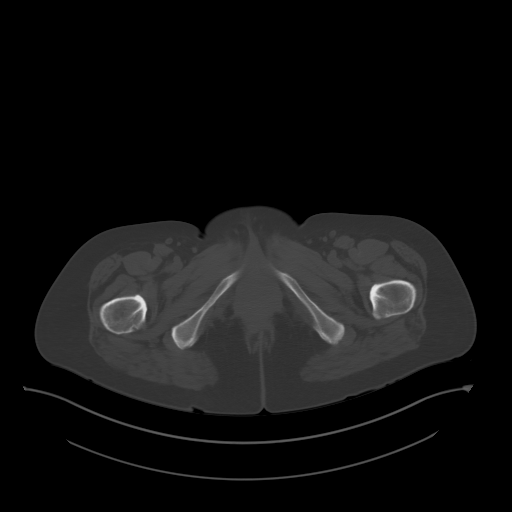
[im 13/94  soft-tissue]
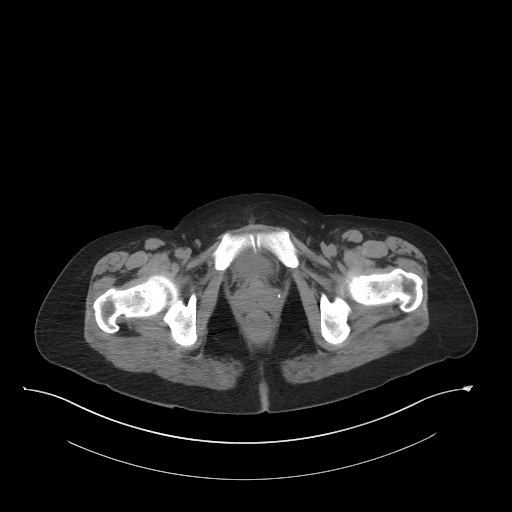
[im 19/94  soft-tissue]
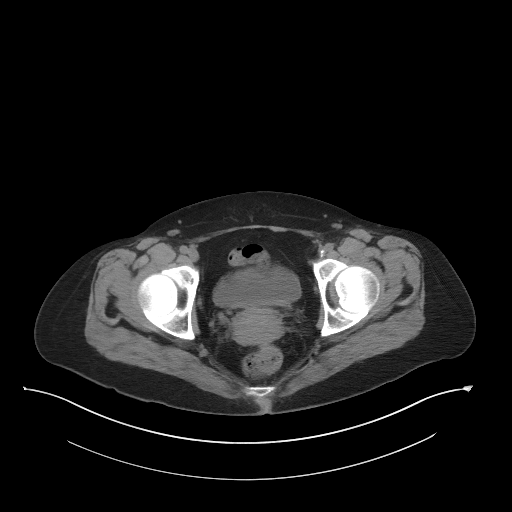
[im 25/94  soft-tissue]
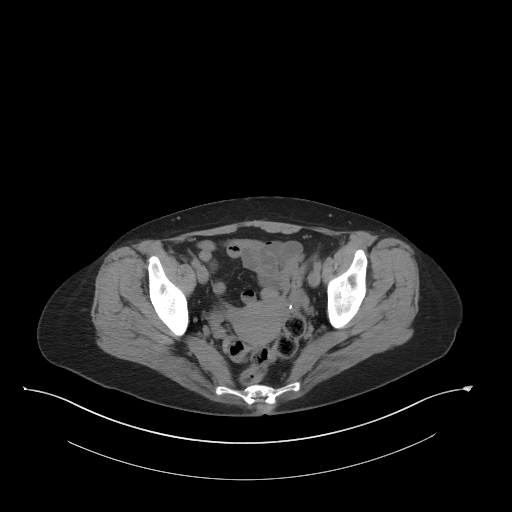
[im 31/94  soft-tissue]
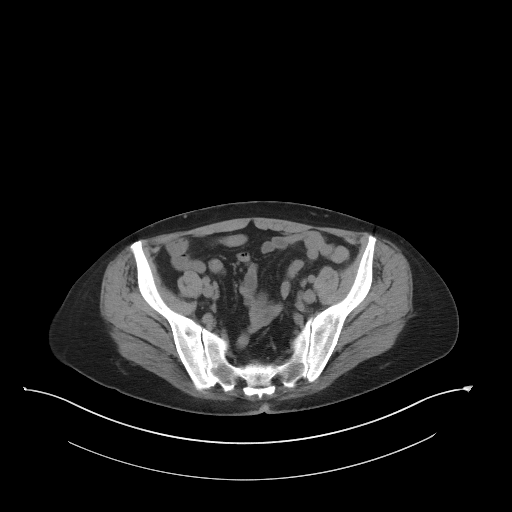
[im 37/94  soft-tissue]
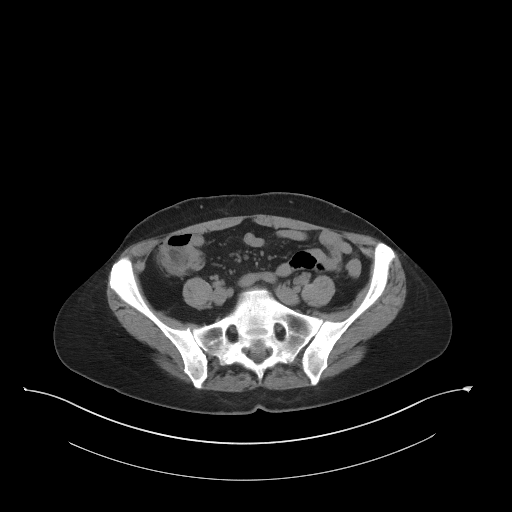
[im 43/94  soft-tissue]
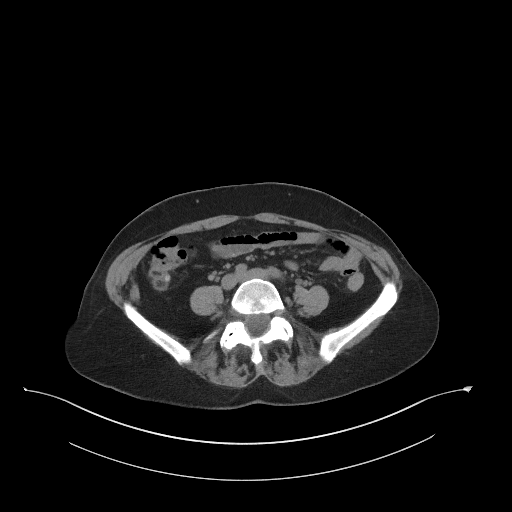
[im 52/94  soft-tissue]
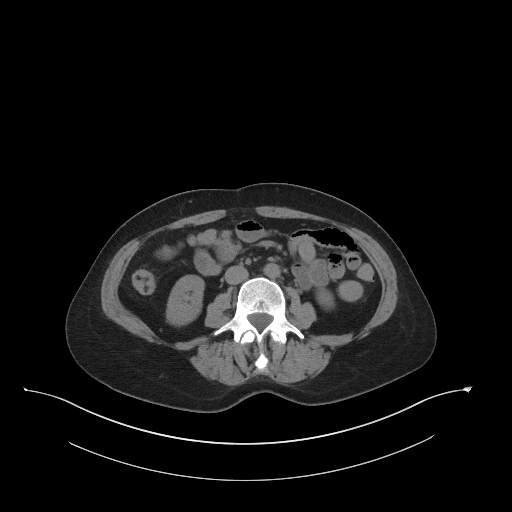
[im 58/94  soft-tissue]
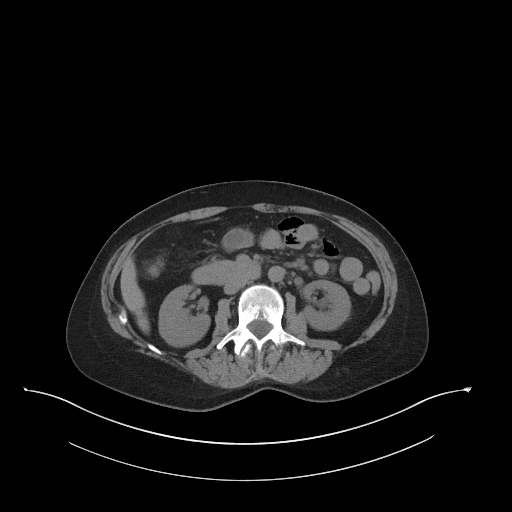
[im 58/94  bone]
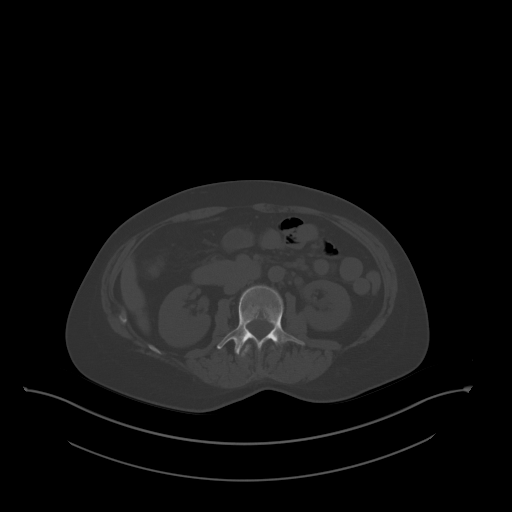
[im 64/94  soft-tissue]
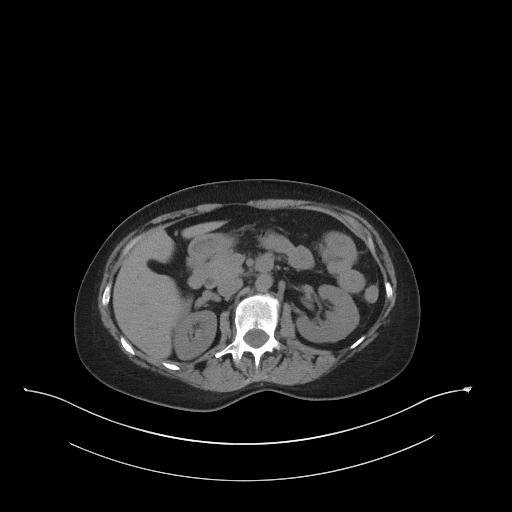
[im 70/94  soft-tissue]
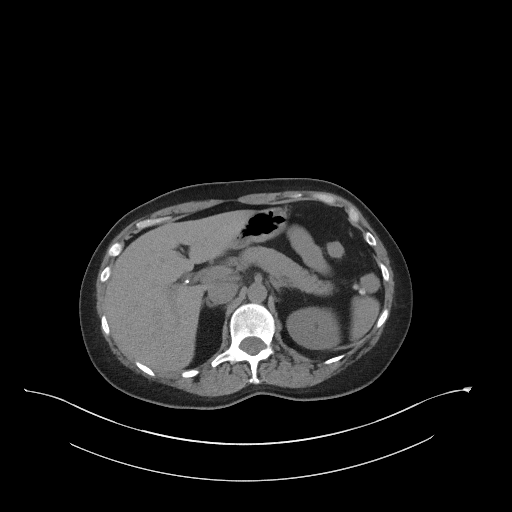
[im 76/94  soft-tissue]
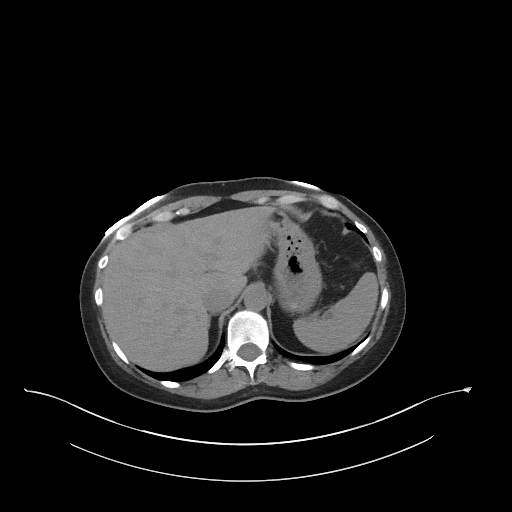
[im 82/94  soft-tissue]
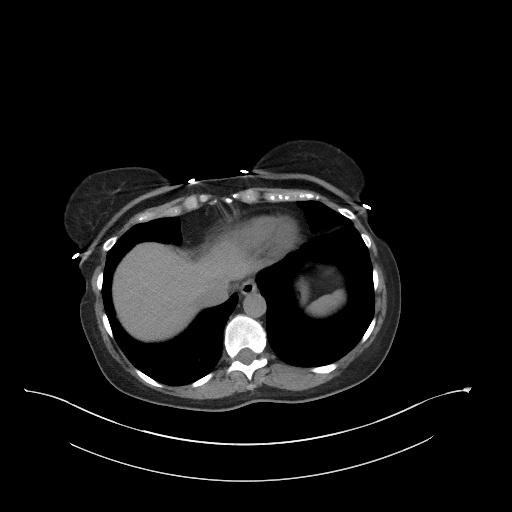
[im 88/94  soft-tissue]
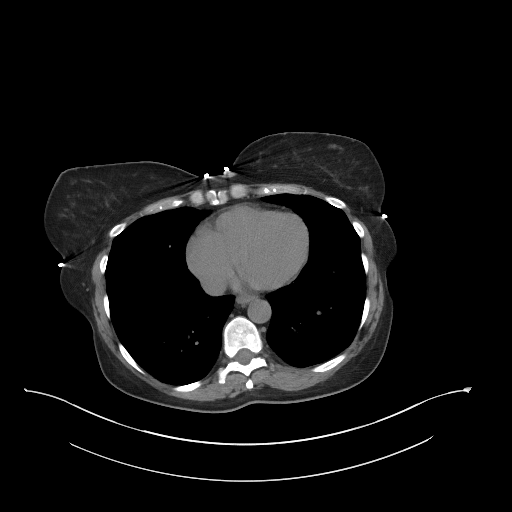

[Series 4: coronal st · coronal · 0.87mm/px · 3 of 90 slices shown]
[im 30/90  soft-tissue]
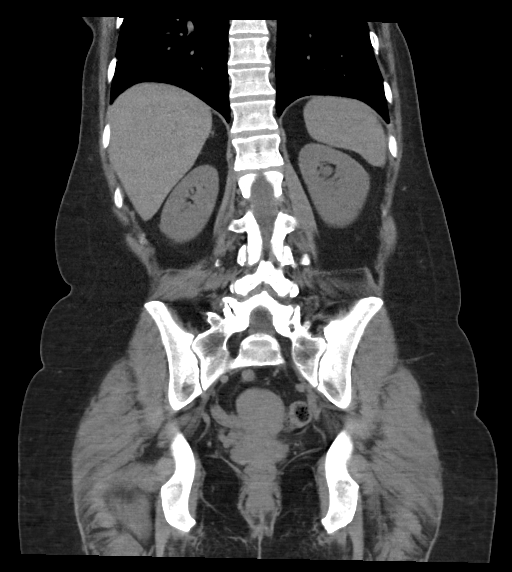
[im 40/90  soft-tissue]
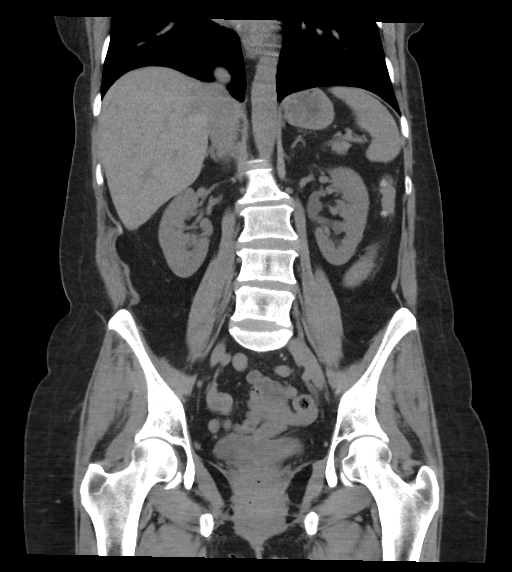
[im 50/90  soft-tissue]
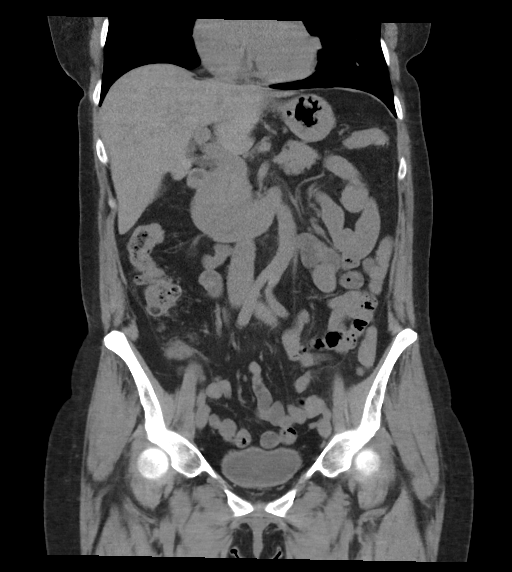

[17 of 46 positions shown; findings below may reference images not displayed]

FINDINGS: Lower chest: Lung bases are clear.

Hepatobiliary: Liver appears normal without contrast. Previous
cholecystectomy.

Pancreas: Normal

Spleen: Normal

Adrenals/Urinary Tract: Adrenal glands are normal. The right kidney
is normal. No cyst, mass, stone or hydronephrosis. Left kidney shows
mild fullness of the renal collecting system and ureter which is
dilated as far as the UVJ. There is a 4 x 6 mm stone in the distal
ureter at the UVJ. No stone in the bladder.

Stomach/Bowel: Stomach and small intestine are normal. There is
diverticulosis of the colon without evidence of diverticulitis.

Vascular/Lymphatic: Aorta is tortuous but does not show
atherosclerotic calcification. IVC is normal. No adenopathy.

Reproductive: Bilateral fallopian tube implants.  No pelvic mass.

Other: No free fluid or air.

Musculoskeletal: Mild lower lumbar degenerative changes.
IMPRESSION: 4 x 6 mm stone in the distal left ureter at the UVJ. Mild left
hydroureteronephrosis. No other urinary tract calcifications.

No other acute or significant abdominal or pelvic finding.

## 2024-01-03 DIAGNOSIS — F4323 Adjustment disorder with mixed anxiety and depressed mood: Secondary | ICD-10-CM | POA: Diagnosis not present

## 2024-03-11 ENCOUNTER — Telehealth: Payer: Self-pay | Admitting: Physician Assistant

## 2024-03-11 NOTE — Telephone Encounter (Signed)
 Copied from CRM #[id number]. Topic: Clinical - Medication Question >> Mar 11, 2024  8:57 AM Olam RAMAN wrote: Reason for CRM: monique from pharmacy estrodial cream, and progestrome CB [phone] Waiting for med prescription and has been pending for over a week per caller

## 2024-03-13 NOTE — Telephone Encounter (Signed)
 Task completed faxed rx form out

## 2024-05-15 ENCOUNTER — Other Ambulatory Visit: Payer: Self-pay | Admitting: Physician Assistant

## 2024-05-15 ENCOUNTER — Other Ambulatory Visit: Payer: Self-pay | Admitting: Family Medicine

## 2024-05-16 NOTE — Telephone Encounter (Signed)
Routing to the covering provider.

## 2024-05-16 NOTE — Telephone Encounter (Signed)
 Brittany is calling from pharmacy to make sure refill request was received because patient called them to check status. I informed her of 3 business days turnaround time to relay to patient.

## 2024-05-19 ENCOUNTER — Telehealth: Payer: Self-pay | Admitting: Physician Assistant

## 2024-05-19 ENCOUNTER — Telehealth: Payer: Self-pay

## 2024-05-19 NOTE — Telephone Encounter (Signed)
 They just wanted to clarify the refills. Advised.

## 2024-05-19 NOTE — Telephone Encounter (Signed)
 Copied from CRM #[id number]. Topic: Clinical - Medication Question >> May 19, 2024  9:12 AM Antwanette L wrote: Reason for CRM: Brittany is calling from [facility] and is requesting clarification on the patient's medications: T4 200 microgram capsules and T3 51 microgram capsules. I do not see this medicine listed on the patient chart.She is also requesting the strength and day supply for the Testosterone  Micronized Powder prescription. Laymon is requesting a callback at [phone]

## 2024-05-19 NOTE — Telephone Encounter (Signed)
 Patient stopped by. She is requesting refill on Maltrexone,  thyroid  med-free T3 & free T4, testosterone  cream. Scripts are to be sent to [facility] in [place]. She is about to run out of one of these meds.

## 2024-05-20 NOTE — Telephone Encounter (Signed)
 Ok for refills. All of this is under non formulary in med list. Ok to print and fax if needed.

## 2024-06-29 ENCOUNTER — Other Ambulatory Visit: Payer: Self-pay | Admitting: Physician Assistant

## 2024-06-29 DIAGNOSIS — Z79890 Hormone replacement therapy (postmenopausal): Secondary | ICD-10-CM

## 2024-06-29 DIAGNOSIS — F3342 Major depressive disorder, recurrent, in full remission: Secondary | ICD-10-CM

## 2024-06-29 DIAGNOSIS — E039 Hypothyroidism, unspecified: Secondary | ICD-10-CM

## 2024-06-29 DIAGNOSIS — Z79899 Other long term (current) drug therapy: Secondary | ICD-10-CM

## 2024-06-29 DIAGNOSIS — Z780 Asymptomatic menopausal state: Secondary | ICD-10-CM

## 2024-06-29 DIAGNOSIS — E559 Vitamin D deficiency, unspecified: Secondary | ICD-10-CM

## 2024-06-29 DIAGNOSIS — E785 Hyperlipidemia, unspecified: Secondary | ICD-10-CM

## 2024-06-30 NOTE — Telephone Encounter (Signed)
 Patient scheduled 07/04/24 at 2:00 pm for 6 month f/u with Vermell Bologna. Patient wants to know if she needs labs done. Please advise.

## 2024-06-30 NOTE — Telephone Encounter (Signed)
 Please call pt she is overdue for f/u for medications

## 2024-07-01 NOTE — Telephone Encounter (Signed)
 Patient informed.

## 2024-07-01 NOTE — Telephone Encounter (Signed)
 She is due for lipid and TSH

## 2024-07-01 NOTE — Telephone Encounter (Signed)
 Patient wants to know if lab orders can be placed so she can come into tomorrow to get them done. Please advise.

## 2024-07-01 NOTE — Telephone Encounter (Signed)
 Labs ordered to have drawn tomorrow. Please come fasting(nothing to eat for 8 hours) drink only water  and black coffee.

## 2024-07-03 LAB — IRON,TIBC AND FERRITIN PANEL
Ferritin: 103 ng/mL (ref 15–150)
Iron Saturation: 27 % (ref 15–55)
Iron: 85 ug/dL (ref 27–159)
Total Iron Binding Capacity: 312 ug/dL (ref 250–450)
UIBC: 227 ug/dL (ref 131–425)

## 2024-07-03 LAB — VITAMIN D 25 HYDROXY (VIT D DEFICIENCY, FRACTURES): Vit D, 25-Hydroxy: 9.5 ng/mL — ABNORMAL LOW (ref 30.0–100.0)

## 2024-07-03 LAB — LIPID PANEL
Chol/HDL Ratio: 4.3 ratio (ref 0.0–4.4)
Cholesterol, Total: 182 mg/dL (ref 100–199)
HDL: 42 mg/dL
LDL Chol Calc (NIH): 120 mg/dL — ABNORMAL HIGH (ref 0–99)
Triglycerides: 109 mg/dL (ref 0–149)
VLDL Cholesterol Cal: 20 mg/dL (ref 5–40)

## 2024-07-03 LAB — CBC WITH DIFFERENTIAL/PLATELET
Basophils Absolute: 0 10*3/uL (ref 0.0–0.2)
Basos: 1 %
EOS (ABSOLUTE): 0.1 10*3/uL (ref 0.0–0.4)
Eos: 2 %
Hematocrit: 41.6 % (ref 34.0–46.6)
Hemoglobin: 14.1 g/dL (ref 11.1–15.9)
Immature Grans (Abs): 0 10*3/uL (ref 0.0–0.1)
Immature Granulocytes: 0 %
Lymphocytes Absolute: 1.7 10*3/uL (ref 0.7–3.1)
Lymphs: 27 %
MCH: 31.6 pg (ref 26.6–33.0)
MCHC: 33.9 g/dL (ref 31.5–35.7)
MCV: 93 fL (ref 79–97)
Monocytes Absolute: 0.4 10*3/uL (ref 0.1–0.9)
Monocytes: 7 %
Neutrophils Absolute: 3.9 10*3/uL (ref 1.4–7.0)
Neutrophils: 63 %
Platelets: 250 10*3/uL (ref 150–450)
RBC: 4.46 x10E6/uL (ref 3.77–5.28)
RDW: 11.8 % (ref 11.7–15.4)
WBC: 6.2 10*3/uL (ref 3.4–10.8)

## 2024-07-03 LAB — CMP14+EGFR
ALT: 15 [IU]/L (ref 0–32)
AST: 20 [IU]/L (ref 0–40)
Albumin: 4.2 g/dL (ref 3.8–4.9)
Alkaline Phosphatase: 97 [IU]/L (ref 49–135)
BUN/Creatinine Ratio: 23 (ref 9–23)
BUN: 11 mg/dL (ref 6–24)
Bilirubin Total: 0.4 mg/dL (ref 0.0–1.2)
CO2: 22 mmol/L (ref 20–29)
Calcium: 9 mg/dL (ref 8.7–10.2)
Chloride: 104 mmol/L (ref 96–106)
Creatinine, Ser: 0.47 mg/dL — ABNORMAL LOW (ref 0.57–1.00)
Globulin, Total: 2.6 g/dL (ref 1.5–4.5)
Glucose: 97 mg/dL (ref 70–99)
Potassium: 4.4 mmol/L (ref 3.5–5.2)
Sodium: 137 mmol/L (ref 134–144)
Total Protein: 6.8 g/dL (ref 6.0–8.5)
eGFR: 112 mL/min/{1.73_m2}

## 2024-07-03 LAB — TSH+FREE T4
Free T4: 1.81 ng/dL — ABNORMAL HIGH (ref 0.82–1.77)
TSH: <0.005 u[IU]/mL — ABNORMAL LOW (ref 0.450–4.500)

## 2024-07-03 LAB — PROGESTERONE: Progesterone: 4.4 ng/mL

## 2024-07-03 LAB — T3, FREE: T3, Free: 12.2 pg/mL — ABNORMAL HIGH (ref 2.0–4.4)

## 2024-07-03 LAB — ESTRADIOL: Estradiol: 209 pg/mL

## 2024-07-04 ENCOUNTER — Encounter: Payer: Self-pay | Admitting: Physician Assistant

## 2024-07-04 ENCOUNTER — Ambulatory Visit: Admitting: Physician Assistant

## 2024-07-04 ENCOUNTER — Ambulatory Visit: Payer: Self-pay | Admitting: Physician Assistant

## 2024-07-04 VITALS — BP 118/75 | HR 76 | Ht 66.0 in | Wt 151.0 lb

## 2024-07-04 DIAGNOSIS — R002 Palpitations: Secondary | ICD-10-CM | POA: Diagnosis not present

## 2024-07-04 DIAGNOSIS — Z780 Asymptomatic menopausal state: Secondary | ICD-10-CM | POA: Diagnosis not present

## 2024-07-04 DIAGNOSIS — F419 Anxiety disorder, unspecified: Secondary | ICD-10-CM

## 2024-07-04 DIAGNOSIS — R7989 Other specified abnormal findings of blood chemistry: Secondary | ICD-10-CM

## 2024-07-04 DIAGNOSIS — F3342 Major depressive disorder, recurrent, in full remission: Secondary | ICD-10-CM

## 2024-07-04 DIAGNOSIS — M62838 Other muscle spasm: Secondary | ICD-10-CM | POA: Diagnosis not present

## 2024-07-04 DIAGNOSIS — E039 Hypothyroidism, unspecified: Secondary | ICD-10-CM | POA: Diagnosis not present

## 2024-07-04 DIAGNOSIS — F410 Panic disorder [episodic paroxysmal anxiety] without agoraphobia: Secondary | ICD-10-CM | POA: Diagnosis not present

## 2024-07-04 DIAGNOSIS — E559 Vitamin D deficiency, unspecified: Secondary | ICD-10-CM

## 2024-07-04 DIAGNOSIS — G43009 Migraine without aura, not intractable, without status migrainosus: Secondary | ICD-10-CM | POA: Diagnosis not present

## 2024-07-04 MED ORDER — AMBULATORY NON FORMULARY MEDICATION: 3 refills | Status: AC

## 2024-07-04 MED ORDER — AMBULATORY NON FORMULARY MEDICATION: 5 refills | Status: AC

## 2024-07-04 MED ORDER — ALPRAZOLAM 0.5 MG PO TABS: ORAL_TABLET | ORAL | 1 refills | Status: AC

## 2024-07-04 MED ORDER — METAXALONE 800 MG PO TABS: 800.0000 mg | ORAL_TABLET | Freq: Three times a day (TID) | ORAL | 1 refills | Status: AC | PRN

## 2024-07-04 MED ORDER — RIZATRIPTAN BENZOATE 10 MG PO TBDP: ORAL_TABLET | ORAL | 5 refills | Status: AC

## 2024-07-04 NOTE — Progress Notes (Unsigned)
 "  Established Patient Office Visit  Subjective   Patient ID: Sydney Bailey, female    DOB: September 20, 1965  Age: 59 y.o. MRN: [id number]  Chief Complaint  Patient presents with   Medical Management of Chronic Issues    HPI .Discussed the use of AI scribe software for clinical note transcription with the patient, who gave verbal consent to proceed.  History of Present Illness Sydney Bailey is a 58 year old female who presents for a review of her recent lab results and management of her thyroid  and vitamin D  levels.  Thyroid  dysfunction and fatigue - Elevated T3 and free T4 levels on recent laboratory testing - Currently taking thyroid  medication in capsule form - Persistent fatigue described as 'completely exhausted all the time'  Palpitations - controlled on metoprolol   Hyperlipidemia - Elevated LDL cholesterol on recent laboratory results  Vitamin d  deficiency - Vitamin D  level significantly low at 9.5 (normal >30) - Not currently taking vitamin D  supplementation - Expresses difficulty obtaining adequate sun exposure, particularly in winter - Inquires about optimal vitamin D  supplementation  Migraine and vertigo - Uses Maxalt  for migraines, but insurance does not cover it - Experiences occasional vertigo, especially when lying on right side - Possible correlation between vertigo and severe headaches, suspecting vestibular migraines - No recent debilitating vertigo episodes, but past episodes included sensation of the room moving away - Manages mild vertigo by touching the wall and standing up quickly - Family history of vertigo in father and grandfather  MDD/Anxiety - Takes Xanax  and Celexa , with recent refills obtained - Rarely uses xanax  but likes to keep on hand, takes celexa  daily.   Breast cyst and surgery history - History of breast cyst removal and breast reduction surgery - Post-surgical mammogram revealed a cyst, considered not unusual after  surgery  Immunization status - Has not received a flu shot this year     ROS See HPI.    Objective:     BP 118/75 (Cuff Size: Normal)   Pulse 76   Ht 5' 6 (1.676 m)   Wt 151 lb (68.5 kg)   LMP 09/01/2010   SpO2 98%   BMI 24.37 kg/m  BP Readings from Last 3 Encounters:  07/04/24 118/75  10/24/23 118/60  04/13/23 130/79   Wt Readings from Last 3 Encounters:  07/04/24 151 lb (68.5 kg)  12/10/23 150 lb (68 kg)  10/24/23 151 lb (68.5 kg)   .    06/26/2022   11:42 AM 06/26/2022    9:09 AM 07/29/2021    8:47 AM 10/11/2020    9:18 AM 08/13/2020    9:17 AM  Depression screen PHQ 2/9  Decreased Interest 0 1 1 0 1  Down, Depressed, Hopeless 1 0 1 0 1  PHQ - 2 Score 1 1 2  0 2  Altered sleeping 0    1  Tired, decreased energy 1    1  Change in appetite 0    1  Feeling bad or failure about yourself  0    3  Trouble concentrating 0    0  Moving slowly or fidgety/restless 0    0  Suicidal thoughts 0    0  PHQ-9 Score 2     8   Difficult doing work/chores     Extremely dIfficult     Data saved with a previous flowsheet row definition   ..    06/26/2022   11:42 AM 08/13/2020    9:19 AM 07/16/2019  9:35 AM 05/07/2018    1:26 PM  GAD 7 : Generalized Anxiety Score  Nervous, Anxious, on Edge 1  2  1  1    Control/stop worrying 0  1  0  0   Worry too much - different things 1  1  0  0   Trouble relaxing 1  1  0  1   Restless 0  0  0  1   Easily annoyed or irritable 1  2  0  1   Afraid - awful might happen 0  0  0  1   Total GAD 7 Score 4 7 1 5   Anxiety Difficulty Not difficult at all Somewhat difficult Not difficult at all Not difficult at all     Data saved with a previous flowsheet row definition       Physical Exam Constitutional:      Appearance: Normal appearance.  HENT:     Head: Normocephalic.  Cardiovascular:     Rate and Rhythm: Normal rate and regular rhythm.     Pulses: Normal pulses.     Heart sounds: Normal heart sounds.  Pulmonary:     Effort:  Pulmonary effort is normal.     Breath sounds: Normal breath sounds.  Musculoskeletal:     Right lower leg: No edema.     Left lower leg: No edema.  Neurological:     General: No focal deficit present.     Mental Status: She is alert and oriented to person, place, and time.  Psychiatric:        Mood and Affect: Mood normal.       The 10-year ASCVD risk score (Arnett DK, et al., 2019) is: 2.2%    Assessment & Plan:  .Sydney Bailey was seen today for medical management of chronic issues.  Diagnoses and all orders for this visit:  Acquired hypothyroidism -     TSH + free T4 -     T3, free  Recurrent major depressive disorder, in full remission  Menopause -     AMBULATORY NON FORMULARY MEDICATION; Progesterone  capsules Take three tablets 100mg  SR progesterone  capsules daily at bedtime, 4 days off per month  Estradiol  4 mg/ml apply 4 clicks to inner thighs once daily as directed, 4 days off per month  PANIC ATTACK -     ALPRAZolam  (XANAX ) 0.5 MG tablet; Take 0.5-1 tablet up to three times daily as needed for anxiety  Anxiety -     ALPRAZolam  (XANAX ) 0.5 MG tablet; Take 0.5-1 tablet up to three times daily as needed for anxiety  Migraine without aura and without status migrainosus, not intractable -     rizatriptan  (MAXALT -MLT) 10 MG disintegrating tablet; TAKE 1 TABLET (10 MG TOTAL) BY MOUTH ONCE AS NEEDED FOR MIGRAINE. MAY REPEAT IN 2 HOURS IF NEEDED.  Muscle spasm -     metaxalone  (SKELAXIN ) 800 MG tablet; Take 1 tablet (800 mg total) by mouth 3 (three) times daily as needed for muscle spasms.  Palpitations  Low serum progesterone  -     AMBULATORY NON FORMULARY MEDICATION; Progesterone  capsules Take three tablets 100mg  SR progesterone  capsules daily at bedtime, 4 days off per month  Estradiol  4 mg/ml apply 4 clicks to inner thighs once daily as directed, 4 days off per month  Vitamin D  deficiency -     Vitamin D , Ergocalciferol , (DRISDOL ) 1.25 MG (50000 UNIT) CAPS  capsule; Take 1 capsule (50,000 Units total) by mouth every 7 (seven) days. Take for 8  total doses(weeks)  Other orders -     AMBULATORY NON FORMULARY MEDICATION; Testosterone  gel 1% 1 click a day to inner thigh. -     AMBULATORY NON FORMULARY MEDICATION; Naltrexone 3mg  one tablet daily.   Assessment & Plan Hypothyroidism Elevated T3 and free T4 levels indicate over-supplementation. No hyperthyroid symptoms present. - Adjust thyroid  medication dosage as needed. - Monitor thyroid  levels in one year or sooner if symptoms develop.  Vitamin D  deficiency Vitamin D  level critically low at 9.5 ng/mL. Discussed importance for bone health and natural synthesis methods. - Start vitamin D  supplementation with 50,000 units weekly for a few weeks. - Encouraged dietary intake of vitamin D  with fat-rich foods. - Encouraged sun exposure for natural vitamin D  synthesis.  Migraine with vertigo Intermittent vertigo possibly linked to vestibular migraines. Discussed potential correlation with headaches. - Continue Maxalt  for migraine management. - Consider using Maxalt  for vertigo episodes if related to vestibular migraines. - Perform Epley maneuvers for vertigo management. - Keep meclizine  on hand for vertigo management.  Menopausal state Estradiol  and progesterone  levels indicate effective hormone supplementation. - Continue current hormone supplementation regimen. - Faxed to [facility].   General Health Maintenance Routine health maintenance discussed, including vaccinations and screenings. - Will obtain mammogram and Pap smear records from previous provider.     Return in about 1 year (around 07/04/2025).    Sydney Rotert, PA-C  "

## 2024-07-08 ENCOUNTER — Encounter: Payer: Self-pay | Admitting: Physician Assistant

## 2024-07-08 MED ORDER — VITAMIN D (ERGOCALCIFEROL) 1.25 MG (50000 UNIT) PO CAPS: 50000.0000 [IU] | ORAL_CAPSULE | ORAL | 0 refills | Status: AC

## 2024-07-10 ENCOUNTER — Telehealth: Payer: Self-pay

## 2024-07-10 NOTE — Telephone Encounter (Signed)
 Copied from CRM [id number]. Topic: Clinical - Medical Advice >> Jul 10, 2024  2:47 PM Nathanel BROCKS wrote: Reason for CRM: pt can go 15% down on her thryroid medicine. T4 175   T3 45 microgram capsules on both. Will not be able to help with this anymore.

## 2024-07-10 NOTE — Telephone Encounter (Signed)
 Message received via E2C2 message.  I do not know if this call came from [facility] ?- but does show that a request was faxed to [facility] on 07/04/2024 requesting their input .

## 2024-07-11 MED ORDER — AMBULATORY NON FORMULARY MEDICATION: 1 refills | Status: AC

## 2024-07-11 NOTE — Telephone Encounter (Signed)
 Please send thyroid  prescription to [facility].

## 2024-07-11 NOTE — Addendum Note (Signed)
 Addended by: ANTONIETTE VERMELL CROME on: 07/11/2024 04:13 PM   Modules accepted: Orders
